# Patient Record
Sex: Female | Born: 1945 | Race: White | Hispanic: No | Marital: Single | Smoking: Former smoker
Health system: Southern US, Community
[De-identification: ages and names within clinical notes are randomized; demographics above are authoritative.]

## PROBLEM LIST (undated history)

## (undated) DIAGNOSIS — F32A Depression, unspecified: Secondary | ICD-10-CM

## (undated) DIAGNOSIS — F329 Major depressive disorder, single episode, unspecified: Secondary | ICD-10-CM

## (undated) DIAGNOSIS — E785 Hyperlipidemia, unspecified: Secondary | ICD-10-CM

## (undated) DIAGNOSIS — E119 Type 2 diabetes mellitus without complications: Secondary | ICD-10-CM

## (undated) DIAGNOSIS — I10 Essential (primary) hypertension: Secondary | ICD-10-CM

## (undated) DIAGNOSIS — K5790 Diverticulosis of intestine, part unspecified, without perforation or abscess without bleeding: Secondary | ICD-10-CM

## (undated) DIAGNOSIS — G4730 Sleep apnea, unspecified: Secondary | ICD-10-CM

## (undated) DIAGNOSIS — C801 Malignant (primary) neoplasm, unspecified: Secondary | ICD-10-CM

## (undated) HISTORY — DX: Hyperlipidemia, unspecified: E78.5

## (undated) HISTORY — DX: Diverticulosis of intestine, part unspecified, without perforation or abscess without bleeding: K57.90

## (undated) HISTORY — PX: GALLBLADDER SURGERY: SHX652

## (undated) HISTORY — DX: Major depressive disorder, single episode, unspecified: F32.9

## (undated) HISTORY — DX: Depression, unspecified: F32.A

## (undated) HISTORY — DX: Malignant (primary) neoplasm, unspecified: C80.1

## (undated) HISTORY — DX: Sleep apnea, unspecified: G47.30

## (undated) HISTORY — DX: Essential (primary) hypertension: I10

## (undated) HISTORY — PX: BREAST SURGERY: SHX581

## (undated) HISTORY — DX: Type 2 diabetes mellitus without complications: E11.9

## (undated) HISTORY — PX: BREAST EXCISIONAL BIOPSY: SUR124

---
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
## 1974-12-29 DIAGNOSIS — C801 Malignant (primary) neoplasm, unspecified: Secondary | ICD-10-CM

## 1974-12-29 HISTORY — PX: ABDOMINAL HYSTERECTOMY: SHX81

## 1974-12-29 HISTORY — DX: Malignant (primary) neoplasm, unspecified: C80.1

## 1993-12-29 HISTORY — PX: BREAST SURGERY: SHX581

## 2002-12-29 HISTORY — PX: JOINT REPLACEMENT: SHX530

## 2014-11-28 DIAGNOSIS — K635 Polyp of colon: Secondary | ICD-10-CM

## 2014-11-28 HISTORY — DX: Polyp of colon: K63.5

## 2015-12-30 HISTORY — PX: EYE SURGERY: SHX253

## 2017-12-29 HISTORY — PX: EYE MUSCLE SURGERY: SHX370

## 2018-08-11 ENCOUNTER — Encounter: Payer: Self-pay | Admitting: Family Medicine

## 2018-08-11 ENCOUNTER — Ambulatory Visit: Payer: Medicare Other | Admitting: Family Medicine

## 2018-08-11 VITALS — BP 110/70 | HR 70 | Temp 98.3°F | Ht 65.0 in | Wt 178.0 lb

## 2018-08-11 DIAGNOSIS — I10 Essential (primary) hypertension: Secondary | ICD-10-CM

## 2018-08-11 DIAGNOSIS — E782 Mixed hyperlipidemia: Secondary | ICD-10-CM

## 2018-08-11 DIAGNOSIS — Z7689 Persons encountering health services in other specified circumstances: Secondary | ICD-10-CM | POA: Diagnosis not present

## 2018-08-11 DIAGNOSIS — E119 Type 2 diabetes mellitus without complications: Secondary | ICD-10-CM

## 2018-08-11 NOTE — Patient Instructions (Signed)
Diabetes Mellitus and Exercise Exercising regularly is important for your overall health, especially when you have diabetes (diabetes mellitus). Exercising is not only about losing weight. It has many health benefits, such as increasing muscle strength and bone density and reducing body fat and stress. This leads to improved fitness, flexibility, and endurance, all of which result in better overall health. Exercise has additional benefits for people with diabetes, including:  Reducing appetite.  Helping to lower and control blood glucose.  Lowering blood pressure.  Helping to control amounts of fatty substances (lipids) in the blood, such as cholesterol and triglycerides.  Helping the body to respond better to insulin (improving insulin sensitivity).  Reducing how much insulin the body needs.  Decreasing the risk for heart disease by: ? Lowering cholesterol and triglyceride levels. ? Increasing the levels of good cholesterol. ? Lowering blood glucose levels.  What is my activity plan? Your health care provider or certified diabetes educator can help you make a plan for the type and frequency of exercise (activity plan) that works for you. Make sure that you:  Do at least 150 minutes of moderate-intensity or vigorous-intensity exercise each week. This could be brisk walking, biking, or water aerobics. ? Do stretching and strength exercises, such as yoga or weightlifting, at least 2 times a week. ? Spread out your activity over at least 3 days of the week.  Get some form of physical activity every day. ? Do not go more than 2 days in a row without some kind of physical activity. ? Avoid being inactive for more than 90 minutes at a time. Take frequent breaks to walk or stretch.  Choose a type of exercise or activity that you enjoy, and set realistic goals.  Start slowly, and gradually increase the intensity of your exercise over time.  What do I need to know about managing my  diabetes?  Check your blood glucose before and after exercising. ? If your blood glucose is higher than 240 mg/dL (13.3 mmol/L) before you exercise, check your urine for ketones. If you have ketones in your urine, do not exercise until your blood glucose returns to normal.  Know the symptoms of low blood glucose (hypoglycemia) and how to treat it. Your risk for hypoglycemia increases during and after exercise. Common symptoms of hypoglycemia can include: ? Hunger. ? Anxiety. ? Sweating and feeling clammy. ? Confusion. ? Dizziness or feeling light-headed. ? Increased heart rate or palpitations. ? Blurry vision. ? Tingling or numbness around the mouth, lips, or tongue. ? Tremors or shakes. ? Irritability.  Keep a rapid-acting carbohydrate snack available before, during, and after exercise to help prevent or treat hypoglycemia.  Avoid injecting insulin into areas of the body that are going to be exercised. For example, avoid injecting insulin into: ? The arms, when playing tennis. ? The legs, when jogging.  Keep records of your exercise habits. Doing this can help you and your health care provider adjust your diabetes management plan as needed. Write down: ? Food that you eat before and after you exercise. ? Blood glucose levels before and after you exercise. ? The type and amount of exercise you have done. ? When your insulin is expected to peak, if you use insulin. Avoid exercising at times when your insulin is peaking.  When you start a new exercise or activity, work with your health care provider to make sure the activity is safe for you, and to adjust your insulin, medicines, or food intake as needed.    Drink plenty of water while you exercise to prevent dehydration or heat stroke. Drink enough fluid to keep your urine clear or pale yellow. This information is not intended to replace advice given to you by your health care provider. Make sure you discuss any questions you have with  your health care provider. Document Released: 03/06/2004 Document Revised: 07/04/2016 Document Reviewed: 05/26/2016 Elsevier Interactive Patient Education  2018 [place].  High Cholesterol High cholesterol is a condition in which the blood has high levels of a white, waxy, fat-like substance (cholesterol). The human body needs small amounts of cholesterol. The liver makes all the cholesterol that the body needs. Extra (excess) cholesterol comes from the food that we eat. Cholesterol is carried from the liver by the blood through the blood vessels. If you have high cholesterol, deposits (plaques) may build up on the walls of your blood vessels (arteries). Plaques make the arteries narrower and stiffer. Cholesterol plaques increase your risk for heart attack and stroke. Work with your health care provider to keep your cholesterol levels in a healthy range. What increases the risk? This condition is more likely to develop in people who:  Eat foods that are high in animal fat (saturated fat) or cholesterol.  Are overweight.  Are not getting enough exercise.  Have a family history of high cholesterol.  What are the signs or symptoms? There are no symptoms of this condition. How is this diagnosed? This condition may be diagnosed from the results of a blood test.  If you are older than age 35, your health care provider may check your cholesterol every 4-6 years.  You may be checked more often if you already have high cholesterol or other risk factors for heart disease.  The blood test for cholesterol measures:  "Bad" cholesterol (LDL cholesterol). This is the main type of cholesterol that causes heart disease. The desired level for LDL is less than 100.  "Good" cholesterol (HDL cholesterol). This type helps to protect against heart disease by cleaning the arteries and carrying the LDL away. The desired level for HDL is 60 or higher.  Triglycerides. These are fats that the body can  store or burn for energy. The desired number for triglycerides is lower than 150.  Total cholesterol. This is a measure of the total amount of cholesterol in your blood, including LDL cholesterol, HDL cholesterol, and triglycerides. A healthy number is less than 200.  How is this treated? This condition is treated with diet changes, lifestyle changes, and medicines. Diet changes  This may include eating more whole grains, fruits, vegetables, nuts, and fish.  This may also include cutting back on red meat and foods that have a lot of added sugar. Lifestyle changes  Changes may include getting at least 40 minutes of aerobic exercise 3 times a week. Aerobic exercises include walking, biking, and swimming. Aerobic exercise along with a healthy diet can help you maintain a healthy weight.  Changes may also include quitting smoking. Medicines  Medicines are usually given if diet and lifestyle changes have failed to reduce your cholesterol to healthy levels.  Your health care provider may prescribe a statin medicine. Statin medicines have been shown to reduce cholesterol, which can reduce the risk of heart disease. Follow these instructions at home: Eating and drinking  If told by your health care provider:  Eat chicken (without skin), fish, veal, shellfish, ground [place] breast, and round or loin cuts of red meat.  Do not eat fried foods or fatty meats,  such as hot dogs and salami.  Eat plenty of fruits, such as apples.  Eat plenty of vegetables, such as broccoli, potatoes, and carrots.  Eat beans, peas, and lentils.  Eat grains such as barley, rice, couscous, and bulgur wheat.  Eat pasta without cream sauces.  Use skim or nonfat milk, and eat low-fat or nonfat yogurt and cheeses.  Do not eat or drink whole milk, cream, ice cream, egg yolks, or hard cheeses.  Do not eat stick margarine or tub margarines that contain trans fats (also called partially hydrogenated oils).  Do not  eat saturated tropical oils, such as coconut oil and palm oil.  Do not eat cakes, cookies, crackers, or other baked goods that contain trans fats.  General instructions  Exercise as directed by your health care provider. Increase your activity level with activities such as gardening, walking, and taking the stairs.  Take over-the-counter and prescription medicines only as told by your health care provider.  Do not use any products that contain nicotine or tobacco, such as cigarettes and e-cigarettes. If you need help quitting, ask your health care provider.  Keep all follow-up visits as told by your health care provider. This is important. Contact a health care provider if:  You are struggling to maintain a healthy diet or weight.  You need help to start on an exercise program.  You need help to stop smoking. Get help right away if:  You have chest pain.  You have trouble breathing. This information is not intended to replace advice given to you by your health care provider. Make sure you discuss any questions you have with your health care provider. Document Released: 12/15/2005 Document Revised: 07/12/2016 Document Reviewed: 06/14/2016 Elsevier Interactive Patient Education  2018 [place].  High Cholesterol High cholesterol is a condition in which the blood has high levels of a white, waxy, fat-like substance (cholesterol). The human body needs small amounts of cholesterol. The liver makes all the cholesterol that the body needs. Extra (excess) cholesterol comes from the food that we eat. Cholesterol is carried from the liver by the blood through the blood vessels. If you have high cholesterol, deposits (plaques) may build up on the walls of your blood vessels (arteries). Plaques make the arteries narrower and stiffer. Cholesterol plaques increase your risk for heart attack and stroke. Work with your health care provider to keep your cholesterol levels in a healthy range. What  increases the risk? This condition is more likely to develop in people who:  Eat foods that are high in animal fat (saturated fat) or cholesterol.  Are overweight.  Are not getting enough exercise.  Have a family history of high cholesterol.  What are the signs or symptoms? There are no symptoms of this condition. How is this diagnosed? This condition may be diagnosed from the results of a blood test.  If you are older than age 42, your health care provider may check your cholesterol every 4-6 years.  You may be checked more often if you already have high cholesterol or other risk factors for heart disease.  The blood test for cholesterol measures:  "Bad" cholesterol (LDL cholesterol). This is the main type of cholesterol that causes heart disease. The desired level for LDL is less than 100.  "Good" cholesterol (HDL cholesterol). This type helps to protect against heart disease by cleaning the arteries and carrying the LDL away. The desired level for HDL is 60 or higher.  Triglycerides. These are fats that the body  can store or burn for energy. The desired number for triglycerides is lower than 150.  Total cholesterol. This is a measure of the total amount of cholesterol in your blood, including LDL cholesterol, HDL cholesterol, and triglycerides. A healthy number is less than 200.  How is this treated? This condition is treated with diet changes, lifestyle changes, and medicines. Diet changes  This may include eating more whole grains, fruits, vegetables, nuts, and fish.  This may also include cutting back on red meat and foods that have a lot of added sugar. Lifestyle changes  Changes may include getting at least 40 minutes of aerobic exercise 3 times a week. Aerobic exercises include walking, biking, and swimming. Aerobic exercise along with a healthy diet can help you maintain a healthy weight.  Changes may also include quitting smoking. Medicines  Medicines are  usually given if diet and lifestyle changes have failed to reduce your cholesterol to healthy levels.  Your health care provider may prescribe a statin medicine. Statin medicines have been shown to reduce cholesterol, which can reduce the risk of heart disease. Follow these instructions at home: Eating and drinking  If told by your health care provider:  Eat chicken (without skin), fish, veal, shellfish, ground [place] breast, and round or loin cuts of red meat.  Do not eat fried foods or fatty meats, such as hot dogs and salami.  Eat plenty of fruits, such as apples.  Eat plenty of vegetables, such as broccoli, potatoes, and carrots.  Eat beans, peas, and lentils.  Eat grains such as barley, rice, couscous, and bulgur wheat.  Eat pasta without cream sauces.  Use skim or nonfat milk, and eat low-fat or nonfat yogurt and cheeses.  Do not eat or drink whole milk, cream, ice cream, egg yolks, or hard cheeses.  Do not eat stick margarine or tub margarines that contain trans fats (also called partially hydrogenated oils).  Do not eat saturated tropical oils, such as coconut oil and palm oil.  Do not eat cakes, cookies, crackers, or other baked goods that contain trans fats.  General instructions  Exercise as directed by your health care provider. Increase your activity level with activities such as gardening, walking, and taking the stairs.  Take over-the-counter and prescription medicines only as told by your health care provider.  Do not use any products that contain nicotine or tobacco, such as cigarettes and e-cigarettes. If you need help quitting, ask your health care provider.  Keep all follow-up visits as told by your health care provider. This is important. Contact a health care provider if:  You are struggling to maintain a healthy diet or weight.  You need help to start on an exercise program.  You need help to stop smoking. Get help right away if:  You have  chest pain.  You have trouble breathing. This information is not intended to replace advice given to you by your health care provider. Make sure you discuss any questions you have with your health care provider. Document Released: 12/15/2005 Document Revised: 07/12/2016 Document Reviewed: 06/14/2016 Elsevier Interactive Patient Education  [employer].

## 2018-08-11 NOTE — Progress Notes (Signed)
Patient presents to clinic today to f/u on chronic issues and establish care.  SUBJECTIVE: PMH: Pt is a 72 yo female with pmh sig for DM II, HTN, HLD.  Pt was previously seen by Gilman Buttner at [facility].  DM II: -Taking Synjardy 12.04-999 mg daily -fsbs at home 175-180 -pt states she is a stress eater and has not been eating as well as she should. -last hgb a1c was April 2019.  HTN: -taking hctz 25 mg daily and verapamil 180 mg daily -since move wants to get back into regular exercise.  Walks her dog daily.  HLD: -taking lipitor 20 mg daily -trying to eat better -denies myalgias  H/o depression: -on Bupropion since 1995 -states is doing well.  Notes a difference when not on the med.  Past Surg Hx: Cataract removal Breast biopsies Hysterectomy 2/2 cervical cancer.  Ovaries in place. B/l TKR  Social hx: Pt is retired from [employer], [employer], worked as an Runner, broadcasting/film/video.  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  Pt denies EtOH, tobacco, and drug use.  Health Maintenance: Mammogram --2019  Current Outpatient Medications on File Prior to Visit  Medication Sig Dispense Refill  . atorvastatin (LIPITOR) 20 MG tablet Take 20 mg by mouth daily.    Marland Kitchen buPROPion (WELLBUTRIN) 100 MG tablet Take 100 mg by mouth daily.    . Empagliflozin-metFORMIN HCl (SYNJARDY) 12.04-999 MG TABS Take 1,000 mg by mouth daily.    . hydrochlorothiazide (HYDRODIURIL) 25 MG tablet Take 25 mg by mouth daily. Take 1/2 tablet by mouth daily    . verapamil (CALAN-SR) 180 MG CR tablet Take 180 mg by mouth at bedtime.     No current facility-administered medications on file prior to visit.     No Known Allergies  No family history on file.  Social History   Socioeconomic History  . Marital status: Unknown    Spouse name: Not on file  . Number of children: Not on file  . Years of education: Not on file  . Highest education level: Not on file  Occupational History  . Not on file    Social Needs  . Financial resource strain: Not on file  . Food insecurity:    Worry: Not on file    Inability: Not on file  . Transportation needs:    Medical: Not on file    Non-medical: Not on file  Tobacco Use  . Smoking status: Not on file  Substance and Sexual Activity  . Alcohol use: Not on file  . Drug use: Not on file  . Sexual activity: Not on file  Lifestyle  . Physical activity:    Days per week: Not on file    Minutes per session: Not on file  . Stress: Not on file  Relationships  . Social connections:    Talks on phone: Not on file    Gets together: Not on file    Attends religious service: Not on file    Active member of club or organization: Not on file    Attends meetings of clubs or organizations: Not on file    Relationship status: Not on file  . Intimate partner violence:    Fear of current or ex partner: Not on file    Emotionally abused: Not on file    Physically abused: Not on file    Forced sexual activity: Not on file  Other Topics Concern  . Not on file  Social History Narrative  .  Not on file    ROS General: Denies fever, chills, night sweats, changes in weight, changes in appetite HEENT: Denies headaches, ear pain, changes in vision, rhinorrhea, sore throat CV: Denies CP, palpitations, SOB, orthopnea Pulm: Denies SOB, cough, wheezing GI: Denies abdominal pain, nausea, vomiting, diarrhea, constipation GU: Denies dysuria, hematuria, frequency, vaginal discharge Msk: Denies muscle cramps, joint pains Neuro: Denies weakness, numbness, tingling Skin: Denies rashes, bruising Psych: Denies depression, anxiety, hallucinations  BP 110/70 (BP Location: Left Arm, Patient Position: Sitting, Cuff Size: Normal)   Pulse 70   Temp 98.3 F (36.8 C) (Oral)   Ht 5\' 5"  (1.651 m)   Wt 178 lb (80.7 kg)   SpO2 96%   BMI 29.62 kg/m   Physical Exam Gen. Pleasant, well developed, well-nourished, in NAD HEENT - [place]/AT, PERRL, no scleral icterus, no nasal  drainage, pharynx without erythema or exudate. Lungs: no use of accessory muscles, CTAB, no wheezes, rales or rhonchi Cardiovascular: RRR, No r/g/m, no peripheral edema Abdomen: BS present, soft, nontender, nondistended Neuro:  A&Ox3, CN II-XII intact, normal gait Skin:  Warm, dry, intact, no lesions  No results found for this or any previous visit (from the past 2160 hour(s)).  Assessment/Plan: Type 2 diabetes mellitus without complication, without long-term current use of insulin (HCC) -Empagliflozin-Metformin 12.04-999 mg daily -lifestyle modifications encouraged. -continue checking fsbs -will check feet at next OFV.  Essential hypertension -controlled -continue HCTZ 25 mg and verapamil 180 mg daily.  Mixed hyperlipidemia -continue lipitor 20 mg  Encounter to establish care -We reviewed the PMH, PSH, FH, SH, Meds and Allergies. -We provided refills for any medications we will prescribe as needed. -We addressed current concerns per orders and patient instructions. -We have asked for records for pertinent exams, studies, vaccines and notes from previous providers. -We have advised patient to follow up per instructions below.  F/u prn   Grier Mitts, MD

## 2018-08-26 ENCOUNTER — Other Ambulatory Visit: Payer: Self-pay | Admitting: Family Medicine

## 2018-08-26 ENCOUNTER — Ambulatory Visit: Payer: Medicare Other | Admitting: Family Medicine

## 2018-08-26 ENCOUNTER — Encounter: Payer: Self-pay | Admitting: Family Medicine

## 2018-08-26 VITALS — BP 110/70 | HR 68 | Temp 98.0°F | Wt 179.0 lb

## 2018-08-26 DIAGNOSIS — N644 Mastodynia: Secondary | ICD-10-CM

## 2018-08-26 NOTE — Progress Notes (Signed)
Subjective:    Patient ID: Tara Guerrero, female    DOB: 1946/12/27, 72 y.o.   MRN: [id number]  No chief complaint on file.   HPI Patient was seen today for acute concern.  Patient endorses right breast/nipple soreness times a few days.  Patient states the area was initially red.  Patient has a family history of breast cancer, dx'd from ages [age].  Patient's last mammogram was done in April or May 2019 in [place].  Past Medical History:  Diagnosis Date  . Cancer ([place])   . Depression   . Diabetes mellitus without complication ([place])   . Hyperlipidemia   . Hypertension     Allergies  Allergen Reactions  . Seasonal Ic [Cholestatin]     Seasonal allergies    ROS General: Denies fever, chills, night sweats, changes in weight, changes in appetite HEENT: Denies headaches, ear pain, changes in vision, rhinorrhea, sore throat CV: Denies CP, palpitations, SOB, orthopnea Pulm: Denies SOB, cough, wheezing GI: Denies abdominal pain, nausea, vomiting, diarrhea, constipation GU: Denies dysuria, hematuria, frequency, vaginal discharge  +R breast soreness. Msk: Denies muscle cramps, joint pains Neuro: Denies weakness, numbness, tingling Skin: Denies rashes, bruising Psych: Denies depression, anxiety, hallucinations     Objective:    Blood pressure 110/70, pulse 68, temperature 98 F (36.7 C), temperature source Oral, weight 179 lb (81.2 kg), SpO2 97 %.   Gen. Pleasant, well-nourished, in no distress, normal affect   HEENT: [place]/AT, face symmetric, no scleral icterus, PERRLA, nares patent without drainage Cardiovascular: RRR, no peripheral edema Breast: No obvious deformities.  TTP of the right breast at 3 o'clock position and right areole and at 3:00 and 6:00 positions.  No obvious masses appreciated.  No nipple discharge or inversion.  No peau d'orange.  No cervical, supraclavicular, or axillary lymphadenopathy. Neuro:  A&Ox3, CN II-XII intact, normal gait Skin:  Warm, no lesions/  rash   Wt Readings from Last 3 Encounters:  08/26/18 179 lb (81.2 kg)  08/11/18 178 lb (80.7 kg)    No results found for: WBC, HGB, HCT, PLT, GLUCOSE, CHOL, TRIG, HDL, LDLDIRECT, LDLCALC, ALT, AST, NA, K, CL, CREATININE, BUN, CO2, TSH, PSA, INR, GLUF, HGBA1C, MICROALBUR  Assessment/Plan:  Soreness breast -Given soreness and family history we will proceed with diagnostic mammogram. -Screening mammogram done May 2019 in [place]. - Plan: MM DIAG BREAST TOMO UNI RIGHT  Follow-up PRN  Grier Mitts, MD

## 2018-09-03 ENCOUNTER — Ambulatory Visit
Admission: RE | Admit: 2018-09-03 | Discharge: 2018-09-03 | Disposition: A | Discharge status: Home / Self Care | Payer: Medicare Other | Source: Ambulatory Visit | Attending: Family Medicine | Admitting: Family Medicine

## 2018-09-03 DIAGNOSIS — N644 Mastodynia: Secondary | ICD-10-CM

## 2018-10-08 ENCOUNTER — Telehealth: Payer: Self-pay | Admitting: Family Medicine

## 2018-10-08 NOTE — Telephone Encounter (Signed)
Copied from [place] [id number]. Topic: General - Other >> Oct 08, 2018  1:21 PM Keene Breath wrote: Reason for CRM: Patient called regarding a refill for her medication, Ramipril.  Patient stated that the pharmacy has not record of it and she does not understand why.  Please advise.  CB# [phone]

## 2018-10-08 NOTE — Telephone Encounter (Signed)
Not our patient

## 2018-10-19 ENCOUNTER — Other Ambulatory Visit: Payer: Self-pay

## 2018-10-19 MED ORDER — VERAPAMIL HCL ER 180 MG PO TBCR: 180.0000 mg | EXTENDED_RELEASE_TABLET | Freq: Every day | ORAL | 0 refills | Status: DC

## 2018-10-19 NOTE — Telephone Encounter (Signed)
Patient requested this on 10/11 and was sent to wrong office. Patient is now calling back stating she is now out of the medication. She said she uses [facility]. She would like to get a weeks worth script for now at [facility] on [address] and the rest of the script sent to [facility]. Thank you. Patient would like a call back when this done.

## 2018-10-20 ENCOUNTER — Other Ambulatory Visit: Payer: Self-pay

## 2018-10-20 NOTE — Telephone Encounter (Signed)
Spoke with pt voiced understanding that Rx for Ramipril was sent to the pharmacy in pt chart, pt requested to add [facility] for future refills of her Rx

## 2018-10-21 NOTE — Telephone Encounter (Signed)
Pt called back stating it was Ramipril that was supposed to be sent to pharmacy. Pt is completely out of this medication. Pt is requesting 1 weeks worth [facility] to [facility] on [address] and the rest sent to [facility] Please follow up with pt.

## 2018-10-22 ENCOUNTER — Other Ambulatory Visit: Payer: Self-pay

## 2018-10-22 MED ORDER — RAMIPRIL 10 MG PO CAPS: 10.0000 mg | ORAL_CAPSULE | Freq: Every day | ORAL | 0 refills | Status: DC

## 2018-10-22 NOTE — Telephone Encounter (Signed)
Spoke with [facility] and because Rx was sent to [facility] then the refill can't be picked up until 11/2018.  Pharmacist will try and get an override from her insurance. Spoke with patient and she states she "will pay cash for refill".  Advised patient to speak with the pharmacist.

## 2018-12-13 ENCOUNTER — Ambulatory Visit: Payer: Self-pay

## 2018-12-13 NOTE — Telephone Encounter (Signed)
Returned call to patient who states that she had heartburn Friday night and got up to use the bathroom and returning to bed she became very dizzy and her vision went black.  She denies passing out.  She states she just sat down on a stool. Her symptoms ar resolved now  Reason for Disposition . [1] Chest pain lasts > 5 minutes AND [2] occurred > 3 days ago (72 hours) AND [3] NO chest pain or cardiac symptoms now  Answer Assessment - Initial Assessment Questions 1. DESCRIPTION: "Describe your dizziness."     spinning 2. VERTIGO: "Do you feel like either you or the room is spinning or tilting?"      spinning 3. LIGHTHEADED: "Do you feel lightheaded?" (e.g., somewhat faint, woozy, weak upon standing)     Fainted black vision but didn't fall 4. SEVERITY: "How bad is it?"  "Can you walk?"   - MILD - Feels unsteady but walking normally.   - MODERATE - Feels very unsteady when walking, but not falling; interferes with normal activities (e.g., school, work) .   - SEVERE - Unable to walk without falling (requires assistance).     No symptoms now 5. ONSET:  "When did the dizziness begin?"     Friday night 6. AGGRAVATING FACTORS: "Does anything make it worse?" (e.g., standing, change in head position)     no 7. CAUSE: "What do you think is causing the dizziness?"     Had heartburn then dizziness 8. RECURRENT SYMPTOM: "Have you had dizziness before?" If so, ask: "When was the last time?" "What happened that time?"     no 9. OTHER SYMPTOMS: "Do you have any other symptoms?" (e.g., headache, weakness, numbness, vomiting, earache)     Chest pain/heartburn down rt arm 10. PREGNANCY: "Is there any chance you are pregnant?" "When was your last menstrual period?"       N/A  Answer Assessment - Initial Assessment Questions 1. LOCATION: "Where does it hurt?"       Center of chest and down rt arm 2. RADIATION: "Does the pain go anywhere else?" (e.g., into neck, jaw, arms, back)     Rt arm 3. ONSET: "When  did the chest pain begin?" (Minutes, hours or days)      Friday night woke from sleep  4. PATTERN "Does the pain come and go, or has it been constant since it started?"  "Does it get worse with exertion?"     constant 5. DURATION: "How long does it last" (e.g., seconds, minutes, hours)     45 minutes 6. SEVERITY: "How bad is the pain?"  (e.g., Scale 1-10; mild, moderate, or severe)    - MILD (1-3): doesn't interfere with normal activities     - MODERATE (4-7): interferes with normal activities or awakens from sleep    - SEVERE (8-10): excruciating pain, unable to do any normal activities       6 7. CARDIAC RISK FACTORS: "Do you have any history of heart problems or risk factors for heart disease?" (e.g., prior heart attack, angina; high blood pressure, diabetes, being overweight, high cholesterol, smoking, or strong family history of heart disease)     Type 2 DM cholesterol meds over weight 8. PULMONARY RISK FACTORS: "Do you have any history of lung disease?"  (e.g., blood clots in lung, asthma, emphysema, birth control pills)     no 9. CAUSE: "What do you think is causing the chest pain?"     unsure 10. OTHER SYMPTOMS: "Do  you have any other symptoms?" (e.g., dizziness, nausea, vomiting, sweating, fever, difficulty breathing, cough)       Sweaty prior dizziness 11. PREGNANCY: "Is there any chance you are pregnant?" "When was your last menstrual period?"       N/A  Protocols used: CHEST PAIN-A-AH, DIZZINESS - VERTIGO-A-AH

## 2018-12-14 ENCOUNTER — Encounter: Payer: Self-pay | Admitting: Family Medicine

## 2018-12-14 ENCOUNTER — Ambulatory Visit (INDEPENDENT_AMBULATORY_CARE_PROVIDER_SITE_OTHER): Payer: Medicare Other | Admitting: Family Medicine

## 2018-12-14 VITALS — BP 112/60 | HR 56 | Temp 98.1°F | Wt 180.0 lb

## 2018-12-14 DIAGNOSIS — R55 Syncope and collapse: Secondary | ICD-10-CM

## 2018-12-14 DIAGNOSIS — R42 Dizziness and giddiness: Secondary | ICD-10-CM

## 2018-12-14 LAB — BASIC METABOLIC PANEL
BUN: 19 mg/dL (ref 6–23)
CO2: 28 meq/L (ref 19–32)
Calcium: 10.2 mg/dL (ref 8.4–10.5)
Chloride: 98 mEq/L (ref 96–112)
Creatinine, Ser: 0.9 mg/dL (ref 0.40–1.20)
GFR: 65.03 mL/min (ref >60.00)
GLUCOSE: 154 mg/dL — AB (ref 70–99)
POTASSIUM: 4.9 meq/L (ref 3.5–5.1)
SODIUM: 138 meq/L (ref 135–145)

## 2018-12-14 LAB — T4, FREE: FREE T4: 0.81 ng/dL (ref 0.60–1.60)

## 2018-12-14 LAB — CBC
HEMATOCRIT: 43.5 % (ref 36.0–46.0)
Hemoglobin: 14.4 g/dL (ref 12.0–15.0)
MCHC: 33.2 g/dL (ref 30.0–36.0)
MCV: 85.5 fl (ref 78.0–100.0)
Platelets: 312 10*3/uL (ref 150.0–400.0)
RBC: 5.08 Mil/uL (ref 3.87–5.11)
RDW: 13.9 % (ref 11.5–15.5)
WBC: 8.8 10*3/uL (ref 4.0–10.5)

## 2018-12-14 LAB — TSH: TSH: 2.52 u[IU]/mL (ref 0.35–4.50)

## 2018-12-14 NOTE — Patient Instructions (Addendum)
We will increase your hydrochlorothiazide to 12.5 mg daily.  You can still continue your other blood pressure medicines.  He should check your blood pressure at home and keep a log of the readings to bring with you to clinic.  Continue drinking plenty of water and staying hydrated daily.  Dizziness Dizziness is a common problem. It makes you feel unsteady or light-headed. You may feel like you are about to pass out (faint). Dizziness can lead to getting hurt if you stumble or fall. Dizziness can be caused by many things, including:  Medicines.  Not having enough water in your body (dehydration).  Illness.  Follow these instructions at home: Eating and drinking  Drink enough fluid to keep your pee (urine) clear or pale yellow. This helps to keep you from getting dehydrated. Try to drink more clear fluids, such as water.  Do not drink alcohol.  Limit how much caffeine you drink or eat, if your doctor tells you to do that.  Limit how much salt (sodium) you drink or eat, if your doctor tells you to do that. Activity  Avoid making quick movements. ? When you stand up from sitting in a chair, steady yourself until you feel okay. ? In the morning, first sit up on the side of the bed. When you feel okay, stand slowly while you hold onto something. Do this until you know that your balance is fine.  If you need to stand in one place for a long time, move your legs often. Tighten and relax the muscles in your legs while you are standing.  Do not drive or use heavy machinery if you feel dizzy.  Avoid bending down if you feel dizzy. Place items in your home so you can reach them easily without leaning over. Lifestyle  Do not use any products that contain nicotine or tobacco, such as cigarettes and e-cigarettes. If you need help quitting, ask your doctor.  Try to lower your stress level. You can do this by using methods such as yoga or meditation. Talk with your doctor if you need  help. General instructions  Watch your dizziness for any changes.  Take over-the-counter and prescription medicines only as told by your doctor. Talk with your doctor if you think that you are dizzy because of a medicine that you are taking.  Tell a friend or a family member that you are feeling dizzy. If he or she notices any changes in your behavior, have this person call your doctor.  Keep all follow-up visits as told by your doctor. This is important. Contact a doctor if:  Your dizziness does not go away.  Your dizziness or light-headedness gets worse.  You feel sick to your stomach (nauseous).  You have trouble hearing.  You have new symptoms.  You are unsteady on your feet.  You feel like the room is spinning. Get help right away if:  You throw up (vomit) or have watery poop (diarrhea), and you cannot eat or drink anything.  You have trouble: ? Talking. ? Walking. ? Swallowing. ? Using your arms, hands, or legs.  You feel generally weak.  You are not thinking clearly, or you have trouble forming sentences. A friend or family member may notice this.  You have: ? Chest pain. ? Pain in your belly (abdomen). ? Shortness of breath. ? Sweating.  Your vision changes.  You are bleeding.  You have a very bad headache.  You have neck pain or a stiff neck.  You  have a fever. These symptoms may be an emergency. Do not wait to see if the symptoms will go away. Get medical help right away. Call your local emergency services (911 in the [place] Do not drive yourself to the hospital. Summary  Dizziness makes you feel unsteady or light-headed. You may feel like you are about to pass out (faint).  Drink enough fluid to keep your pee (urine) clear or pale yellow. Do not drink alcohol.  Avoid making quick movements if you feel dizzy.  Watch your dizziness for any changes. This information is not intended to replace advice given to you by your health care provider.  Make sure you discuss any questions you have with your health care provider. Document Released: 12/04/2011 Document Revised: 01/01/2017 Document Reviewed: 01/01/2017 Elsevier Interactive Patient Education  2017 [employer].  Near-Syncope Near-syncope is when you suddenly become weak or dizzy, or you feel like you might pass out (faint). During an episode of near-syncope, you may:  Feel dizzy or light-headed.  Feel nauseous.  See all white or all black in your field of vision.  Have cold, clammy skin.  This condition is caused by a sudden decrease in blood flow to the brain. This decrease can result from various causes, but most of those causes are not dangerous. However, near-syncope can be a sign of a serious medical problem, so it is important to seek medical care. If you fainted, get medical help right away.Call your local emergency services (911 in the [place] Do not drive yourself to the hospital. Follow these instructions at home: Pay attention to any changes in your symptoms. Take these actions to help with your condition:  Have someone stay with you until you feel stable.  Do not drive, use machinery, or play sports until your health care provider says it is okay.  Keep all follow-up visits as told by your health care provider. This is important.  If you start to feel like you might faint, lie down right away and raise (elevate) your feet above the level of your heart. Breathe deeply and steadily. Wait until all of the symptoms have passed.  Drink enough fluid to keep your urine clear or pale yellow.  If you are taking blood pressure or heart medicine, get up slowly and take several minutes to sit and then stand. This can reduce dizziness.  Take over-the-counter and prescription medicines only as told by your health care provider.  Get help right away if:  You have a severe headache.  You have unusual pain in your chest, abdomen, or back.  You are bleeding from your  mouth or rectum, or you have black or tarry stool.  You have a very fast or irregular heartbeat (palpitations).  You faint once or repeatedly.  You have a seizure.  You are confused.  You have trouble walking.  You have severe weakness.  You have vision problems. These symptoms may represent a serious problem that is an emergency. Do not wait to see if your symptoms will go away. Get medical help right away. Call your local emergency services (911 in the [place] Do not drive yourself to the hospital. This information is not intended to replace advice given to you by your health care provider. Make sure you discuss any questions you have with your health care provider. Document Released: 12/15/2005 Document Revised: 05/22/2016 Document Reviewed: 08/29/2015 Elsevier Interactive Patient Education  2017 [employer].

## 2018-12-14 NOTE — Progress Notes (Signed)
Subjective:    Patient ID: Tara Guerrero, female    DOB: Nov 02, 1946, 72 y.o.   MRN: [id number]  No chief complaint on file.   HPI Patient was seen today for acute concern.  Pt notes near [place]opal episode Friday [place]  Pt woke up in the middle the night to urinate.  While going back to bed pt felt dizzy, had a burning sensation in her chest, and notes her vision became dark.  Pt states she laid on the floor for about 20 minutes before going back to bed.  Pt denies history of heartburn, vertigo.  Pt states she has been drinking plenty of fluids and had dinner that night.  Pt is never had this happen to her before.  Since then pt has been feeling ok.  Pt taking ramipril 10 mg, HCTZ 12.5 mg, verapamil 180 mg for HTN.  Pt has not checked her bp, but states she can.  Past Medical History:  Diagnosis Date  . Cancer ([place])   . Depression   . Diabetes mellitus without complication ([place])   . Hyperlipidemia   . Hypertension     Allergies  Allergen Reactions  . Seasonal Ic [Cholestatin]     Seasonal allergies     ROS General: Denies fever, chills, night sweats, changes in weight, changes in appetite  +dizziness HEENT: Denies headaches, ear pain, rhinorrhea, sore throat  +changes in vision CV: Denies CP, palpitations, SOB, orthopnea Pulm: Denies SOB, cough, wheezing GI: Denies abdominal pain, nausea, vomiting, diarrhea, constipation  +burning in chest GU: Denies dysuria, hematuria, frequency, vaginal discharge Msk: Denies muscle cramps, joint pains Neuro: Denies weakness, numbness, tingling Skin: Denies rashes, bruising Psych: Denies depression, anxiety, hallucinations    Objective:    Blood pressure 112/60, pulse (!) 56, temperature 98.1 F (36.7 C), temperature source Oral, weight 180 lb (81.6 kg), SpO2 96 %.   Gen. Pleasant, well-nourished, in no distress, normal affect   HEENT: [place]/AT, face symmetric, no scleral icterus, PERRLA, no nystagmus, nares  patent without drainage, pharynx without erythema or exudate.  No carotid bruits.  TMs normal bilaterally. Lungs: no accessory muscle use, CTAB, no wheezes or rales Cardiovascular: RRR, no m/r/g, no peripheral edema Musculoskeletal: No deformities, no cyanosis or clubbing, normal tone and strength Neuro:  A&Ox3, CN II-XII intact, normal gait  Wt Readings from Last 3 Encounters:  12/14/18 180 lb (81.6 kg)  08/26/18 179 lb (81.2 kg)  08/11/18 178 lb (80.7 kg)    No results found for: WBC, HGB, HCT, PLT, GLUCOSE, CHOL, TRIG, HDL, LDLDIRECT, LDLCALC, ALT, AST, NA, K, CL, CREATININE, BUN, CO2, TSH, PSA, INR, GLUF, HGBA1C, MICROALBUR  Assessment/Plan:  Postural dizziness with near syncope -Orthostatics obtained and normal -Given patient's low BP at baseline discussed holding hydrochlorothiazide 12.5 mg. -Pt to continue verapamil 180 mg and ramipril 10 mg daily -Encouraged to check BP daily and keep a record to bring with her to clinic -Continue p.o. hydration - Plan: Basic metabolic panel, TSH, T4, free, CBC (no diff)  Follow-up PRN in the next month, sooner if needed  Grier Mitts, MD

## 2018-12-28 ENCOUNTER — Other Ambulatory Visit: Payer: Self-pay

## 2018-12-28 MED ORDER — RAMIPRIL 10 MG PO CAPS: 10.0000 mg | ORAL_CAPSULE | Freq: Every day | ORAL | 2 refills | Status: DC

## 2019-01-11 ENCOUNTER — Encounter: Payer: Self-pay | Admitting: Family Medicine

## 2019-01-11 NOTE — Telephone Encounter (Signed)
Copied from [facility] [id number]. Topic: Quick Communication - Rx Refill/Question >> Jan 11, 2019  2:11 PM Tara Guerrero E wrote: Medication: hydrochlorothiazide (HYDRODIURIL) 25 MG tablet  Pt needs clarification for this medication. Pt is taking half tablet of a 25mg  tablet - (12.5mg ) but stated she was advised by Dr. Volanda Napoleon to increse her dose to 12.5.  Pt wants to know if Dr. Volanda Napoleon wants her to increase to 1 tablet @ 25mg  per day?

## 2019-01-12 ENCOUNTER — Other Ambulatory Visit: Payer: Self-pay

## 2019-01-12 MED ORDER — RAMIPRIL 10 MG PO CAPS: 10.0000 mg | ORAL_CAPSULE | Freq: Every day | ORAL | 2 refills | Status: DC

## 2019-01-12 MED ORDER — BUPROPION HCL 100 MG PO TABS: 100.0000 mg | ORAL_TABLET | Freq: Every day | ORAL | 3 refills | Status: DC

## 2019-01-12 MED ORDER — EMPAGLIFLOZIN-METFORMIN HCL 12.5-1000 MG PO TABS: 1000.0000 mg | ORAL_TABLET | Freq: Two times a day (BID) | ORAL | 3 refills | Status: DC

## 2019-01-12 MED ORDER — ATORVASTATIN CALCIUM 20 MG PO TABS: 20.0000 mg | ORAL_TABLET | Freq: Every day | ORAL | 3 refills | Status: DC

## 2019-01-12 MED ORDER — VERAPAMIL HCL ER 180 MG PO TBCR: 180.0000 mg | EXTENDED_RELEASE_TABLET | Freq: Every day | ORAL | 0 refills | Status: DC

## 2019-01-19 ENCOUNTER — Telehealth: Payer: Self-pay

## 2019-01-19 NOTE — Telephone Encounter (Signed)
Author phoned pt. to offer initial AWV. Appointment made for 1/27 at [place]. Author advised pt. To bring along any advance directive she may have, and pt. Stated she will try to retrieve records from [place], where she used to live and seek medical care.

## 2019-01-21 NOTE — Progress Notes (Signed)
Subjective:   Tara Guerrero is a 73 y.o. female who presents for an Initial Medicare Annual Wellness Visit.  Review of Systems    No ROS.  Medicare Wellness Visit. Additional risk factors are reflected in the social history.   Cardiac Risk Factors include: hypertension;diabetes mellitus;dyslipidemia;advanced age (>36men, >67 women);sedentary lifestyle Sleep patterns: no sleep issues per pt.   Home Safety/Smoke Alarms: Feels safe in home. Smoke alarms in place.  Living environment; residence and Firearm Safety: 2-story house. No use or need for DME at this time.  Seat Belt Safety/Bike Helmet: Wears seat belt.   Female:   Pap- N/A as over 65yo      Mammo- 04/2018, due 04/2020       Dexa scan- had one in past, but unsure of last one. Order placed.       CCS-  06/2014; pt states "I'm due at the end of the year".       Objective:    Today's Vitals   01/24/19 0948  BP: 136/76  Pulse: 62  Resp: 16  SpO2: 95%  Weight: 180 lb (81.6 kg)  Height: 5' 4.5" (1.638 m)  PainSc: 0-No pain   Body mass index is 30.42 kg/m.  Advanced Directives 01/24/2019  Does Patient Have a Medical Advance Directive? Yes  Type of Paramedic of [place];Living will  Does patient want to make changes to medical advance directive? Yes (MAU/Ambulatory/Procedural Areas - Information given)  Copy of [place] in Chart? No - copy requested    Current Medications (verified) Outpatient Encounter Medications as of 01/24/2019  Medication Sig  . aspirin EC 81 MG tablet Take 81 mg by mouth daily.  Marland Kitchen atorvastatin (LIPITOR) 20 MG tablet Take 1 tablet (20 mg total) by mouth daily.  Marland Kitchen buPROPion (WELLBUTRIN) 100 MG tablet Take 1 tablet (100 mg total) by mouth daily.  . cetirizine (ZYRTEC) 10 MG chewable tablet Chew 10 mg by mouth daily.  . Empagliflozin-metFORMIN HCl (SYNJARDY) 12.04-999 MG TABS Take 1,000 mg by mouth 2 (two) times daily.  . hydrochlorothiazide  (HYDRODIURIL) 25 MG tablet Take 25 mg by mouth daily. Take 1/2 tablet by mouth daily  . ramipril (ALTACE) 10 MG capsule Take 1 capsule (10 mg total) by mouth daily.  . verapamil (CALAN-SR) 180 MG CR tablet Take 1 tablet (180 mg total) by mouth at bedtime.   No facility-administered encounter medications on file as of 01/24/2019.     Allergies (verified) Seasonal ic [cholestatin]   History: Past Medical History:  Diagnosis Date  . 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 Depression   . Diabetes mellitus without complication ([place])   . Hyperlipidemia   . Hypertension    Past Surgical History:  Procedure Laterality Date  . ABDOMINAL HYSTERECTOMY  1976   s/p uterine CA  . BREAST EXCISIONAL BIOPSY Bilateral   . BREAST SURGERY  1995   L breast node removal  . BREAST SURGERY     R breast biopsy  . EYE MUSCLE SURGERY  2019  . EYE SURGERY  2017   bilateral cataract surgery  . JOINT REPLACEMENT  2004   bilateral knee replacements  . TONSILLECTOMY  1950   Family History  Problem Relation Age of Onset  . Cancer Mother   . Hyperlipidemia Mother   . Hypertension Mother   . Stroke Mother   . Breast cancer Mother        diagnosed in her 7's  . Alcohol abuse  Father   . Cancer Sister   . Hyperlipidemia Sister   . Hypertension Sister   . Stroke Sister   . Breast cancer Sister        diagnosed in her 48's  . Mental illness Maternal Grandmother   . Hypertension Maternal Grandmother   . Hyperlipidemia Maternal Grandmother   . Mental retardation Maternal Grandmother   . Hearing loss Maternal Grandfather   . Stroke Maternal Grandfather   . Alcohol abuse Paternal Grandmother   . Alcohol abuse Paternal Grandfather   . Breast cancer Cousin        diagnosed in her 36's  . Breast cancer Sister 70  . Breast cancer Other        diagnosed late 20's   Social History   Socioeconomic History  . Marital status: Single    Spouse name: Not on file  . Number of children: 1  . Years of education:  Not on file  . Highest education level: Not on file  Occupational History    Comment: part-time daycare assistant  Social Needs  . Financial resource strain: Not hard at all  . Food insecurity:    Worry: Never true    Inability: Never true  . Transportation needs:    Medical: No    Non-medical: No  Tobacco Use  . Smoking status: Former Research scientist (life sciences)  . Smokeless tobacco: Never Used  Substance and Sexual Activity  . Alcohol use: Yes    Alcohol/week: 1.0 standard drinks    Types: 1 Glasses of wine per week  . Drug use: Never  . Sexual activity: Not Currently  Lifestyle  . Physical activity:    Days per week: 0 days    Minutes per session: 0 min  . Stress: Not at all  Relationships  . Social connections:    Talks on phone: Twice a week    Gets together: Once a week    Attends religious service: More than 4 times per year    Active member of club or organization: No    Attends meetings of clubs or organizations: Never    Relationship status: Not on file  Other Topics Concern  . Not on file  Social History Narrative   01/24/2019: Lives with granddaughter and dog.    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     Tobacco Counseling Counseling given: Not Answered    Activities of Daily Living In your present state of health, do you have any difficulty performing the following activities: 01/24/2019  Hearing? N  Vision? N  Difficulty concentrating or making decisions? N  Walking or climbing stairs? N  Dressing or bathing? N  Doing errands, shopping? N  Preparing Food and eating ? N  Using the Toilet? N  In the past six months, have you accidently leaked urine? N  Do you have problems with loss of bowel control? N  Managing your Medications? N  Managing your Finances? N  Housekeeping or managing your Housekeeping? N     Immunizations and Health Maintenance  There is no immunization history on file for this  patient. Health Maintenance Due  Topic Date Due  . Hepatitis C Screening  [birth year]  . TETANUS/TDAP  10/22/1963  . DEXA SCAN  10/21/2009    Patient Care Team: Billie Ruddy, MD as PCP - General (Family Medicine)  Indicate any recent Medical Services you may have received from other than [facility] providers  in the past year (date may be approximate).     Assessment:   This is a routine wellness examination for Tara Guerrero. Physical assessment deferred to PCP.   Hearing/Vision screen Hearing Screening Comments: Able to hear conversational tones w/o difficulty. No issues reported. Pt. Declined hearing screen.   Vision Screening Comments: States vision is fine; sees Dr. Manuella Ghazi yearly per pt. Report. Author requested pt. To bring records from [place] at next OV. Pt. Declined vision screen here.  Dietary issues and exercise activities discussed: Current Exercise Habits: The patient does not participate in regular exercise at present(walks dog), Exercise limited by: psychological condition(s)(when asked if she desires to do anything more physical, pt. stated "I'll do it if I want to". )  Diet (meal preparation, eat out, water intake, caffeinated beverages, dairy products, fruits and vegetables): in general, a "healthy" diet  per pt. Pt. Did not wish to elaborate.      Goals    . Patient Stated     Patient did not have any goals.      Depression Screen PHQ 2/9 Scores 01/24/2019  Exception Documentation Patient refusal    Fall Risk Fall Risk  01/24/2019  Falls in the past year? 0     Cognitive Function:       Ad8 score reviewed for issues:  Issues making decisions: no  Less interest in hobbies / activities: no  Repeats questions, stories (family complaining): no  Trouble using ordinary gadgets (microwave, computer, phone):no  Forgets the month or year: no  Mismanaging finances: no  Remembering appts: no  Daily problems with thinking and/or memory: no Ad8 score is=  0  When asked if pt. has noticed any changes in her memory, pt. Denied any issues. During medication review, pt. admitted to not always taking her medications routinely at the same time everyday, and when asked if it was a matter of remembering to take them, pt. Became annoyed that the question was asked, but did not say one way or another. Pt. Grew offended when asked if she uses a medication box. Nevertheless, she stated that she did, and when asked if her granddaughter could potentially assist in reminding her to take her medications, as well as offering other tips, pt. stated " I don't need that". Pt. had not taken her BP meds prior to appointment, BP slightly elevated during visit.     Screening Tests Health Maintenance  Topic Date Due  . Hepatitis C Screening  02/13/1946  . TETANUS/TDAP  10/22/1963  . DEXA SCAN  10/21/2009  . PNA vac Low Risk Adult (1 of 2 - PCV13) 02/26/2019 (Originally 10/21/2009)  . MAMMOGRAM  05/27/2020  . COLONOSCOPY  07/27/2024  . INFLUENZA VACCINE  Completed     Plan:    Please retrieve any medical records from your previous provider in [place], including exams, lab results, immunizations, advance directives, etc.  DEXA, or bone density scan ordered. [facility] should be following up with you to schedule.  Keep consistent schedule for taking your medications. Let us know if the dizziness returns.  Will follow up with Dr. Volanda Napoleon regarding need for replacement diabetic meter and supplies. I have personally reviewed and noted the following in the patient's chart:   . Medical and social history . Use of alcohol, tobacco or illicit drugs  . Current medications and supplements . Functional ability and status . Nutritional status . Physical activity . Advanced directives . List of other physicians . Hospitalizations, surgeries, and ER visits in previous  12 months . Vitals . Screenings to include cognitive, depression, and falls . Referrals and  appointments  In addition, I have reviewed and discussed with patient certain preventive protocols, quality metrics, and best practice recommendations. A written personalized care plan for preventive services as well as general preventive health recommendations were provided to patient.     Alphia Moh, RN   01/24/2019

## 2019-01-24 ENCOUNTER — Ambulatory Visit (INDEPENDENT_AMBULATORY_CARE_PROVIDER_SITE_OTHER): Payer: Medicare Other

## 2019-01-24 ENCOUNTER — Telehealth: Payer: Self-pay

## 2019-01-24 VITALS — BP 136/76 | HR 62 | Resp 16 | Ht 64.5 in | Wt 180.0 lb

## 2019-01-24 DIAGNOSIS — Z0000 Encounter for general adult medical examination without abnormal findings: Secondary | ICD-10-CM | POA: Diagnosis not present

## 2019-01-24 DIAGNOSIS — Z13820 Encounter for screening for osteoporosis: Secondary | ICD-10-CM

## 2019-01-24 NOTE — Patient Instructions (Addendum)
Please retrieve any medical records from your previous provider in [place], including exams, lab results, immunizations, advance directives, etc.  DEXA, or bone density scan ordered. [facility] should be following up with you to schedule.  Keep consistent schedule for taking your medications. Let us know if the dizziness returns.  Will follow up with Dr. Volanda Napoleon regarding need for replacement diabetic meter and supplies.   Health Maintenance, Female Adopting a healthy lifestyle and getting preventive care can go a long way to promote health and wellness. Talk with your health care provider about what schedule of regular examinations is right for you. This is a good chance for you to check in with your provider about disease prevention and staying healthy. In between checkups, there are plenty of things you can do on your own. Experts have done a lot of research about which lifestyle changes and preventive measures are most likely to keep you healthy. Ask your health care provider for more information. Weight and diet Eat a healthy diet  Be sure to include plenty of vegetables, fruits, low-fat dairy products, and lean protein.  Do not eat a lot of foods high in solid fats, added sugars, or salt.  Get regular exercise. This is one of the most important things you can do for your health. ? Most adults should exercise for at least 150 minutes each week. The exercise should increase your heart rate and make you sweat (moderate-intensity exercise). ? Most adults should also do strengthening exercises at least twice a week. This is in addition to the moderate-intensity exercise. Maintain a healthy weight  Body mass index (BMI) is a measurement that can be used to identify possible weight problems. It estimates body fat based on height and weight. Your health care provider can help determine your BMI and help you achieve or maintain a healthy weight.  For females 62 years of age and older: ? A BMI  below 18.5 is considered underweight. ? A BMI of 18.5 to 24.9 is normal. ? A BMI of 25 to 29.9 is considered overweight. ? A BMI of 30 and above is considered obese. Watch levels of cholesterol and blood lipids  You should start having your blood tested for lipids and cholesterol at 75 years of age, then have this test every 5 years.  You may need to have your cholesterol levels checked more often if: ? Your lipid or cholesterol levels are high. ? You are older than 75 years of age. ? You are at high risk for heart disease. Cancer screening Lung Cancer  Lung cancer screening is recommended for adults 10-52 years old who are at high risk for lung cancer because of a history of smoking.  A yearly low-dose CT scan of the lungs is recommended for people who: ? Currently smoke. ? Have quit within the past 15 years. ? Have at least a 30-pack-year history of smoking. A pack year is smoking an average of one pack of cigarettes a day for 1 year.  Yearly screening should continue until it has been 15 years since you quit.  Yearly screening should stop if you develop a health problem that would prevent you from having lung cancer treatment. Breast Cancer  Practice breast self-awareness. This means understanding how your breasts normally appear and feel.  It also means doing regular breast self-exams. Let your health care provider know about any changes, no matter how small.  If you are in your 20s or 30s, you should have a clinical breast  exam (CBE) by a health care provider every 1-3 years as part of a regular health exam.  If you are 53 or older, have a CBE every year. Also consider having a breast X-ray (mammogram) every year.  If you have a family history of breast cancer, talk to your health care provider about genetic screening.  If you are at high risk for breast cancer, talk to your health care provider about having an MRI and a mammogram every year.  Breast cancer gene (BRCA)  assessment is recommended for women who have family members with BRCA-related cancers. BRCA-related cancers include: ? Breast. ? Ovarian. ? Tubal. ? Peritoneal cancers.  Results of the assessment will determine the need for genetic counseling and BRCA1 and BRCA2 testing. Cervical Cancer Your health care provider may recommend that you be screened regularly for cancer of the pelvic organs (ovaries, uterus, and vagina). This screening involves a pelvic examination, including checking for microscopic changes to the surface of your cervix (Pap test). You may be encouraged to have this screening done every 3 years, beginning at age 58.  For women ages 62-65, health care providers may recommend pelvic exams and Pap testing every 3 years, or they may recommend the Pap and pelvic exam, combined with testing for human papilloma virus (HPV), every 5 years. Some types of HPV increase your risk of cervical cancer. Testing for HPV may also be done on women of any age with unclear Pap test results.  Other health care providers may not recommend any screening for nonpregnant women who are considered low risk for pelvic cancer and who do not have symptoms. Ask your health care provider if a screening pelvic exam is right for you.  If you have had past treatment for cervical cancer or a condition that could lead to cancer, you need Pap tests and screening for cancer for at least 20 years after your treatment. If Pap tests have been discontinued, your risk factors (such as having a new sexual partner) need to be reassessed to determine if screening should resume. Some women have medical problems that increase the chance of getting cervical cancer. In these cases, your health care provider may recommend more frequent screening and Pap tests. Colorectal Cancer  This type of cancer can be detected and often prevented.  Routine colorectal cancer screening usually begins at 75 years of age and continues through 75 years  of age.  Your health care provider may recommend screening at an earlier age if you have risk factors for colon cancer.  Your health care provider may also recommend using home test kits to check for hidden blood in the stool.  A small camera at the end of a tube can be used to examine your colon directly (sigmoidoscopy or colonoscopy). This is done to check for the earliest forms of colorectal cancer.  Routine screening usually begins at age 91.  Direct examination of the colon should be repeated every 5-10 years through 75 years of age. However, you may need to be screened more often if early forms of precancerous polyps or small growths are found. Skin Cancer  Check your skin from head to toe regularly.  Tell your health care provider about any new moles or changes in moles, especially if there is a change in a mole's shape or color.  Also tell your health care provider if you have a mole that is larger than the size of a pencil eraser.  Always use sunscreen. Apply sunscreen liberally and repeatedly  throughout the day.  Protect yourself by wearing long sleeves, pants, a wide-brimmed hat, and sunglasses whenever you are outside. Heart disease, diabetes, and high blood pressure  High blood pressure causes heart disease and increases the risk of stroke. High blood pressure is more likely to develop in: ? People who have blood pressure in the high end of the normal range (130-139/85-89 mm Hg). ? People who are overweight or obese. ? People who are African American.  If you are 65-59 years of age, have your blood pressure checked every 3-5 years. If you are 35 years of age or older, have your blood pressure checked every year. You should have your blood pressure measured twice-once when you are at a hospital or clinic, and once when you are not at a hospital or clinic. Record the average of the two measurements. To check your blood pressure when you are not at a hospital or clinic, you can  use: ? An automated blood pressure machine at a pharmacy. ? A home blood pressure monitor.  If you are between 58 years and 1 years old, ask your health care provider if you should take aspirin to prevent strokes.  Have regular diabetes screenings. This involves taking a blood sample to check your fasting blood sugar level. ? If you are at a normal weight and have a low risk for diabetes, have this test once every three years after 75 years of age. ? If you are overweight and have a high risk for diabetes, consider being tested at a younger age or more often. Preventing infection Hepatitis B  If you have a higher risk for hepatitis B, you should be screened for this virus. You are considered at high risk for hepatitis B if: ? You were born in a country where hepatitis B is common. Ask your health care provider which countries are considered high risk. ? Your parents were born in a high-risk country, and you have not been immunized against hepatitis B (hepatitis B vaccine). ? You have HIV or AIDS. ? You use needles to inject street drugs. ? You live with someone who has hepatitis B. ? You have had sex with someone who has hepatitis B. ? You get hemodialysis treatment. ? You take certain medicines for conditions, including cancer, organ transplantation, and autoimmune conditions. Hepatitis C  Blood testing is recommended for: ? Everyone born from [birth year] through 1967. ? Anyone with known risk factors for hepatitis C. Sexually transmitted infections (STIs)  You should be screened for sexually transmitted infections (STIs) including gonorrhea and chlamydia if: ? You are sexually active and are younger than 75 years of age. ? You are older than 75 years of age and your health care provider tells you that you are at risk for this type of infection. ? Your sexual activity has changed since you were last screened and you are at an increased risk for chlamydia or gonorrhea. Ask your health care  provider if you are at risk.  If you do not have HIV, but are at risk, it may be recommended that you take a prescription medicine daily to prevent HIV infection. This is called pre-exposure prophylaxis (PrEP). You are considered at risk if: ? You are sexually active and do not regularly use condoms or know the HIV status of your partner(s). ? You take drugs by injection. ? You are sexually active with a partner who has HIV. Talk with your health care provider about whether you are at high risk of  being infected with HIV. If you choose to begin PrEP, you should first be tested for HIV. You should then be tested every 3 months for as long as you are taking PrEP. Pregnancy  If you are premenopausal and you may become pregnant, ask your health care provider about preconception counseling.  If you may become pregnant, take 400 to 800 micrograms (mcg) of folic acid every day.  If you want to prevent pregnancy, talk to your health care provider about birth control (contraception). Osteoporosis and menopause  Osteoporosis is a disease in which the bones lose minerals and strength with aging. This can result in serious bone fractures. Your risk for osteoporosis can be identified using a bone density scan.  If you are 87 years of age or older, or if you are at risk for osteoporosis and fractures, ask your health care provider if you should be screened.  Ask your health care provider whether you should take a calcium or vitamin D supplement to lower your risk for osteoporosis.  Menopause may have certain physical symptoms and risks.  Hormone replacement therapy may reduce some of these symptoms and risks. Talk to your health care provider about whether hormone replacement therapy is right for you. Follow these instructions at home:  Schedule regular health, dental, and eye exams.  Stay current with your immunizations.  Do not use any tobacco products including cigarettes, chewing tobacco, or  electronic cigarettes.  If you are pregnant, do not drink alcohol.  If you are breastfeeding, limit how much and how often you drink alcohol.  Limit alcohol intake to no more than 1 drink per day for nonpregnant women. One drink equals 12 ounces of beer, 5 ounces of wine, or 1 ounces of hard liquor.  Do not use street drugs.  Do not share needles.  Ask your health care provider for help if you need support or information about quitting drugs.  Tell your health care provider if you often feel depressed.  Tell your health care provider if you have ever been abused or do not feel safe at home. This information is not intended to replace advice given to you by your health care provider. Make sure you discuss any questions you have with your health care provider. Document Released: 06/30/2011 Document Revised: 05/22/2016 Document Reviewed: 09/18/2015 Elsevier Interactive Patient Education  2019 [employer].

## 2019-01-24 NOTE — Telephone Encounter (Signed)
During AWV, pt expressed need for replacement diabetic meter and supplies as her machine is broken. She currently uses a one-touch brand, not sure which kind. When last checked last week, pt. stated her BG was 150mg /dL after eating. Author offered to have pt. bring new meter, once received, into office if there were any questions on its use.  When asked, pt. stated she has not had dizzy spell since last discussed with PCP, and is still taking, at least sometimes, her HCTZ. When author asked if remembering to take her medications was an issue, pt. became offended, and was overall offended by author's questions during the visit. Author explained that questions about memory, depression, medication compliance, etc., were all a part of a wellness visit, to assess any needs, given that this was pt's initial wellness visit, and pt. verbalized understanding, but was still  resistant to offering up information or chatting in general. Nevertheless, author advised to keep to a schedule regarding taking meds, and let us know if dizziness returns. Author did not complete MMSE, vision, or hearing screen due to patient resistance for further questioning. "I'll tell you if there's something wrong. I'm fine.", pt stated.   Routed to PCP as FYI and for follow-up on requested diabetic meter and supplies.

## 2019-01-27 ENCOUNTER — Other Ambulatory Visit: Payer: Self-pay

## 2019-01-27 NOTE — Telephone Encounter (Signed)
Re-routed to PCP, as well as CMA, to address.

## 2019-01-31 ENCOUNTER — Other Ambulatory Visit: Payer: Self-pay | Admitting: Family Medicine

## 2019-01-31 ENCOUNTER — Telehealth: Payer: Self-pay | Admitting: Family Medicine

## 2019-01-31 ENCOUNTER — Other Ambulatory Visit: Payer: Self-pay

## 2019-01-31 MED ORDER — ONETOUCH ULTRA 2 W/DEVICE KIT: PACK | 0 refills | Status: AC

## 2019-01-31 NOTE — Telephone Encounter (Signed)
Copied from [place] [id number]. Topic: Quick Communication - See Telephone Encounter >> Jan 31, 2019  4:09 PM Ivar Drape wrote: CRM for notification. See Telephone encounter for: 01/31/19. Patient is in need of a new One Touch meter w/strips and have it sent to her preferred pharmacy [facility] on [address].

## 2019-01-31 NOTE — Telephone Encounter (Signed)
Copied from [place] [phone]. Topic: Quick Communication - Rx Refill/Question >> Jan 31, 2019  3:56 PM Windy Kalata wrote: Medication: buPROPion (WELLBUTRIN) 100 MG tablet     Has the patient contacted their pharmacy? Yes.   (Agent: If no, request that the patient contact the pharmacy for the refill.) (Agent: If yes, when and what did the pharmacy advise?)Call office  Preferred Pharmacy (with phone number or street name): [place], [place] [phone] (Phone) [phone] (Fax)    Agent: Please be advised that RX refills may take up to 3 business days. We ask that you follow-up with your pharmacy.

## 2019-01-31 NOTE — Telephone Encounter (Signed)
Rx was sent to pt pharmacy as requested °

## 2019-01-31 NOTE — Telephone Encounter (Signed)
Pt Diabetic meter was sent to pharmacy for refill

## 2019-02-01 NOTE — Telephone Encounter (Signed)
Spoke with pt pharmacy gave clerification on Wellbutrin Rx per dr Volanda Napoleon

## 2019-02-02 NOTE — Progress Notes (Signed)
Medical screening examination/treatment was performed by qualified clinical staff member and as supervising physician I was immediately available for consultation/collaboration. I have reviewed documentation and agree with assessment and plan.  Kaikoa Magro R Doug Bucklin, MD  

## 2019-03-11 ENCOUNTER — Ambulatory Visit: Payer: Self-pay

## 2019-03-11 NOTE — Telephone Encounter (Signed)
Please Advise

## 2019-03-11 NOTE — Telephone Encounter (Signed)
Patient called and says she was exposed to the flu from her granddaughter who lives with her. The granddaughter was diagnosed this morning at 0800. The patient says the granddaughter's doctor recommended her to contact her PCP and ask for Tamiflu to prevent the flu and for something for nausea to take with Tamiflu, since it can cause nausea. The patient says she is not having any symptoms, except a headache for the past couple of days, but says this is related to seasonal allergies. I advised continue taking allergy medications and try Tylenol for the headache. I advised I will send this request to Dr. Volanda Napoleon and someone will call back with her recommendation, care advice given, patient verbalized understanding.  Reason for Disposition . [1] Influenza EXPOSURE (Close Contact) within last 48 hours (2 days) AND [2] exposed person is HIGH RISK (e.g., age > 14 years, pregnant, HIV+, chronic medical condition)  Answer Assessment - Initial Assessment Questions 1. TYPE of EXPOSURE: "How were you exposed?" (e.g., close contact, not a close contact)     Granddaughter who lives with her 2. DATE of EXPOSURE: "When did the exposure occur?" (e.g., hour, days, weeks)     Diagnosed this morning 3. PREGNANCY: "Is there any chance you are pregnant?" "When was your last menstrual period?"     N/A 4. HIGH RISK for COMPLICATIONS: "Do you have any heart or lung problems? Do you have a weakened immune system?" (e.g., CHF, COPD, asthma, HIV positive, chemotherapy, renal failure, diabetes mellitus, sickle cell anemia)     DM 5. SYMPTOMS: "Do you have any symptoms?" (e.g., cough, fever, sore throat, difficulty breathing).     No  Protocols used: INFLUENZA EXPOSURE-A-AH

## 2019-03-14 NOTE — Telephone Encounter (Signed)
CRM Created 

## 2019-03-14 NOTE — Telephone Encounter (Signed)
Will you call and check on this pt/see if she developed any symptoms over the wknd?  Thanks.

## 2019-03-14 NOTE — Telephone Encounter (Signed)
Called pt left a detailed message to return my call in the office regarding her concerns of being exposed to flu from her granddaughter

## 2019-03-15 ENCOUNTER — Other Ambulatory Visit: Payer: Self-pay | Admitting: Family Medicine

## 2019-03-15 MED ORDER — BUPROPION HCL 100 MG PO TABS: 100.0000 mg | ORAL_TABLET | Freq: Every day | ORAL | 1 refills | Status: DC

## 2019-03-15 MED ORDER — ATORVASTATIN CALCIUM 20 MG PO TABS: 20.0000 mg | ORAL_TABLET | Freq: Every day | ORAL | 1 refills | Status: DC

## 2019-03-15 MED ORDER — EMPAGLIFLOZIN-METFORMIN HCL 12.5-1000 MG PO TABS: 1000.0000 mg | ORAL_TABLET | Freq: Two times a day (BID) | ORAL | 1 refills | Status: DC

## 2019-03-15 NOTE — Telephone Encounter (Signed)
I sent prescriptions to [facility] for Empagliflozin-metformin, Bupropion and Atorvastatin.  Rxs for Verapamil and Ramipril were previously sent.

## 2019-03-15 NOTE — Telephone Encounter (Signed)
Copied from [place] [id number]. Topic: Quick Communication - Rx Refill/Question >> Mar 15, 2019 11:29 AM Virl Axe D wrote: Medication: verapamil (CALAN-SR) 180 MG CR tablet / Empagliflozin-metFORMIN HCl (SYNJARDY) 12.04-999 MG TABS / buPROPion (WELLBUTRIN) 100 MG tablet / atorvastatin (LIPITOR) 20 MG tablet / Ramipril 10 MG / Pt requesting 90 day supply for all rx's  Has the patient contacted their pharmacy? Yes.   (Agent: If no, request that the patient contact the pharmacy for the refill.) (Agent: If yes, when and what did the pharmacy advise?)  Preferred Pharmacy (with phone number or street name): [place], [place] [phone] (Phone) [phone] (Fax)    Agent: Please be advised that RX refills may take up to 3 business days. We ask that you follow-up with your pharmacy.

## 2019-03-17 NOTE — Telephone Encounter (Signed)
Spoke with pt states that she still has a running nose, headache and has a heaviness feeling in her chest, pt request for advise

## 2019-03-18 NOTE — Telephone Encounter (Signed)
Pt should be evaluated if symptoms continuing

## 2019-03-21 ENCOUNTER — Telehealth: Payer: Self-pay | Admitting: Family Medicine

## 2019-03-21 ENCOUNTER — Other Ambulatory Visit: Payer: Self-pay

## 2019-03-21 MED ORDER — HYDROCHLOROTHIAZIDE 25 MG PO TABS: 25.0000 mg | ORAL_TABLET | Freq: Every day | ORAL | 0 refills | Status: DC

## 2019-03-21 NOTE — Telephone Encounter (Signed)
Spoke with pt states that she previously took [facility] SR 100 mg and she did well with it, ok to resend Rx

## 2019-03-21 NOTE — Telephone Encounter (Signed)
Copied from [place] [id number]. Topic: Quick Communication - See Telephone Encounter >> Mar 21, 2019  8:48 AM Blase Mess A wrote: CRM for notification. See Telephone encounter for: 03/21/19. Deborra Medina calling from [facility] is calling regarding buPROPion [facility]) 100 MG tablet [[id number]] is the script correct? The patient has a history of being on the sustained released. Should the script be the sustained release or is this correct of the immediate release Please advise CB [phone] Order [id number] Last attempt

## 2019-03-22 ENCOUNTER — Other Ambulatory Visit: Payer: Self-pay

## 2019-03-22 MED ORDER — EMPAGLIFLOZIN-METFORMIN HCL 12.5-1000 MG PO TABS: 1000.0000 mg | ORAL_TABLET | Freq: Two times a day (BID) | ORAL | 5 refills | Status: DC

## 2019-03-22 NOTE — Telephone Encounter (Signed)
Ok to send in sustained release.

## 2019-03-22 NOTE — Telephone Encounter (Signed)
Spoke with pt states that she feels better that previously, pt was offered advise on having a Web Ex if she needs to be see by dr Volanda Napoleon.

## 2019-03-23 ENCOUNTER — Other Ambulatory Visit: Payer: Self-pay

## 2019-03-23 MED ORDER — BUPROPION HCL ER (SR) 100 MG PO TB12: 100.0000 mg | ORAL_TABLET | Freq: Every day | ORAL | 3 refills | Status: DC

## 2019-03-23 NOTE — Telephone Encounter (Signed)
Rx was changed to pt request and faxed to pt pharmacy

## 2019-05-25 ENCOUNTER — Telehealth: Payer: Self-pay | Admitting: Family Medicine

## 2019-05-25 ENCOUNTER — Other Ambulatory Visit: Payer: Self-pay | Admitting: Family Medicine

## 2019-05-25 DIAGNOSIS — Z1231 Encounter for screening mammogram for malignant neoplasm of breast: Secondary | ICD-10-CM

## 2019-05-25 NOTE — Telephone Encounter (Signed)
The patient called in wanting to know why she had a MM3D sreening ordered for her instead of a Diagnostic mammogram this year like last year. I spoke with Dr. Volanda Napoleon and she suggested to call imaging and see why they ordered the MM3D screening for the patient.  Please Advise

## 2019-06-03 NOTE — Telephone Encounter (Signed)
Spoke with pt states that she felt a lump on her right Breast, Pt has been scheduled for an office visit on Monday 06/08/2019

## 2019-06-08 ENCOUNTER — Other Ambulatory Visit: Payer: Self-pay

## 2019-06-08 ENCOUNTER — Other Ambulatory Visit: Payer: Self-pay | Admitting: Family Medicine

## 2019-06-08 ENCOUNTER — Ambulatory Visit (INDEPENDENT_AMBULATORY_CARE_PROVIDER_SITE_OTHER): Payer: Medicare Other | Admitting: Family Medicine

## 2019-06-08 ENCOUNTER — Encounter: Payer: Self-pay | Admitting: Family Medicine

## 2019-06-08 VITALS — BP 120/60 | HR 84 | Temp 97.9°F | Wt 187.0 lb

## 2019-06-08 DIAGNOSIS — E119 Type 2 diabetes mellitus without complications: Secondary | ICD-10-CM | POA: Insufficient documentation

## 2019-06-08 DIAGNOSIS — E1169 Type 2 diabetes mellitus with other specified complication: Secondary | ICD-10-CM | POA: Insufficient documentation

## 2019-06-08 DIAGNOSIS — N63 Unspecified lump in unspecified breast: Secondary | ICD-10-CM

## 2019-06-08 DIAGNOSIS — I10 Essential (primary) hypertension: Secondary | ICD-10-CM | POA: Insufficient documentation

## 2019-06-08 LAB — POCT GLYCOSYLATED HEMOGLOBIN (HGB A1C): Hemoglobin A1C: 7.5 % — AB (ref 4.0–5.6)

## 2019-06-08 NOTE — Progress Notes (Signed)
Subjective:    Patient ID: Tara Guerrero, female    DOB: July 30, 1946, 73 y.o.   MRN: [id number]  No chief complaint on file.   HPI Patient was seen today for ongoing concern.  Pt with lump in R breast x 1 yr, but feels like it has increased in size.  Per pt area is in underneath breast.  Pt denies skin changes, erythema, nipple d/c.  Pt scheduled for mammogram in August.  DM II: pt notes snacking more.  Taking empagliflozin-metformin 12.5-1000mg .  Also on ACEI.  Pt mentions she was upset during her AWV.  Pt did not appreciate being asked if she was sexually active.  This provider explained the reasoning for question including offering STI testing.   Past Medical History:  Diagnosis Date  . Cancer ([place]) 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  . Depression   . Diabetes mellitus without complication ([place])   . Hyperlipidemia   . Hypertension     Allergies  Allergen Reactions  . Seasonal Ic [Cholestatin]     Seasonal allergies    ROS General: Denies fever, chills, night sweats, changes in weight, changes in appetite HEENT: Denies headaches, ear pain, changes in vision, rhinorrhea, sore throat CV: Denies CP, palpitations, SOB, orthopnea Pulm: Denies SOB, cough, wheezing GI: Denies abdominal pain, nausea, vomiting, diarrhea, constipation GU: Denies dysuria, hematuria, frequency, vaginal discharge  +lump in R breast Msk: Denies muscle cramps, joint pains Neuro: Denies weakness, numbness, tingling Skin: Denies rashes, bruising Psych: Denies depression, anxiety, hallucinations     Objective:    Blood pressure 120/60, pulse 84, temperature 97.9 F (36.6 C), temperature source Oral, weight 187 lb (84.8 kg), SpO2 97 %.   Gen. Pleasant, well-nourished, in no distress, normal affect   HEENT: [place]/AT, face symmetric, conjunctiva clear, no scleral icterus, PERRLA, EOMI, nares patent without drainage Lungs: no accessory muscle use, no wheezes or rales Cardiovascular: RRR,  no peripheral edema Breast:  Normal  in appearance.  No peau d'orange, erythema, nipple inversion, or lesions.  Fibrocystic changes noted b/l.  Unable to palpate a mass in area of concern noted by pt.  A 1 cm mass noted in R breast at the 8 o'clock positions, mobile. Neuro:  A&Ox3, CN II-XII intact, normal gait Skin:  Warm, no lesions/ rash   Wt Readings from Last 3 Encounters:  01/24/19 180 lb (81.6 kg)  12/14/18 180 lb (81.6 kg)  08/26/18 179 lb (81.2 kg)    Lab Results  Component Value Date   WBC 8.8 12/14/2018   HGB 14.4 12/14/2018   HCT 43.5 12/14/2018   PLT 312.0 12/14/2018   GLUCOSE 154 (H) 12/14/2018   NA 138 12/14/2018   K 4.9 12/14/2018   CL 98 12/14/2018   CREATININE 0.90 12/14/2018   BUN 19 12/14/2018   CO2 28 12/14/2018   TSH 2.52 12/14/2018    Assessment/Plan:  Lump or mass in breast -given pt's family hx and change of mass will order diagnostic mammogram. - Plan: MM Digital Diagnostic Bilat  Controlled type 2 diabetes mellitus without complication, without long-term current use of insulin (HCC)  -controlled -continue empagliflozin-metformin 12.04-999 mg -continue monitoring fsbs -continue lifestyle modifications - Plan: POC HgB A1c  F/u prn   Grier Mitts, MD

## 2019-06-08 NOTE — Patient Instructions (Addendum)
Breast Self-Awareness Breast self-awareness means being familiar with how your breasts look and feel. It involves checking your breasts regularly and reporting any changes to your health care provider. Practicing breast self-awareness is important. A change in your breasts can be a sign of a serious medical problem. Being familiar with how your breasts look and feel allows you to find any problems early, when treatment is more likely to be successful. All women should practice breast self-awareness, including women who have had breast implants. How to do a breast self-exam One way to learn what is normal for your breasts and whether your breasts are changing is to do a breast self-exam. To do a breast self-exam: Look for Changes  1. Remove all the clothing above your waist. 2. Stand in front of a mirror in a room with good lighting. 3. Put your hands on your hips. 4. Push your hands firmly downward. 5. Compare your breasts in the mirror. Look for differences between them (asymmetry), such as: ? Differences in shape. ? Differences in size. ? Puckers, dips, and bumps in one breast and not the other. 6. Look at each breast for changes in your skin, such as: ? Redness. ? Scaly areas. 7. Look for changes in your nipples, such as: ? Discharge. ? Bleeding. ? Dimpling. ? Redness. ? A change in position. Feel for Changes Carefully feel your breasts for lumps and changes. It is best to do this while lying on your back on the floor and again while sitting or standing in the shower or tub with soapy water on your skin. Feel each breast in the following way:  Place the arm on the side of the breast you are examining above your head.  Feel your breast with the other hand.  Start in the nipple area and make  inch (2 cm) overlapping circles to feel your breast. Use the pads of your three middle fingers to do this. Apply light pressure, then medium pressure, then firm pressure. The light pressure  will allow you to feel the tissue closest to the skin. The medium pressure will allow you to feel the tissue that is a little deeper. The firm pressure will allow you to feel the tissue close to the ribs.  Continue the overlapping circles, moving downward over the breast until you feel your ribs below your breast.  Move one finger-width toward the center of the body. Continue to use the  inch (2 cm) overlapping circles to feel your breast as you move slowly up toward your collarbone.  Continue the up and down exam using all three pressures until you reach your armpit.  Write Down What You Find  Write down what is normal for each breast and any changes that you find. Keep a written record with breast changes or normal findings for each breast. By writing this information down, you do not need to depend only on memory for size, tenderness, or location. Write down where you are in your menstrual cycle, if you are still menstruating. If you are having trouble noticing differences in your breasts, do not get discouraged. With time you will become more familiar with the variations in your breasts and more comfortable with the exam. How often should I examine my breasts? Examine your breasts every month. If you are breastfeeding, the best time to examine your breasts is after a feeding or after using a breast pump. If you menstruate, the best time to examine your breasts is 5-7 days after your period  is over. During your period, your breasts are lumpier, and it may be more difficult to notice changes. When should I see my health care provider? See your health care provider if you notice:  A change in shape or size of your breasts or nipples.  A change in the skin of your breast or nipples, such as a reddened or scaly area.  Unusual discharge from your nipples.  A lump or thick area that was not there before.  Pain in your breasts.  Anything that concerns you. This information is not intended to  replace advice given to you by your health care provider. Make sure you discuss any questions you have with your health care provider. Document Released: 12/15/2005 Document Revised: 05/22/2016 Document Reviewed: 11/04/2015 Elsevier Interactive Patient Education  2019 [employer]  Preventing Diabetes Mellitus Complications You can take action to prevent or slow down problems that are caused by diabetes (diabetes mellitus). Following your diabetes plan and taking care of yourself can reduce your risk of serious or life-threatening complications. What actions can I take to prevent diabetes complications? Manage your diabetes   Follow instructions from your health care providers about managing your diabetes. Your diabetes may be managed by a team of health care providers who can teach you how to care for yourself and can answer questions that you have.  Educate yourself about your condition so you can make healthy choices about eating and physical activity.  Check your blood sugar (glucose) levels as often as directed. Your health care provider will help you decide how often to check your blood glucose level depending on your treatment goals and how well you are meeting them.  Ask your health care provider if you should take low-dose aspirin daily and what dose is recommended for you. Taking low-dose aspirin daily is recommended to help prevent cardiovascular disease. Do not use nicotine or tobacco Do not use any products that contain nicotine or tobacco, such as cigarettes and e-cigarettes. If you need help quitting, ask your health care provider. Nicotine raises your risk for diabetes problems. If you quit using nicotine:  You will lower your risk for heart attack, stroke, nerve disease, and kidney disease.  Your cholesterol and blood pressure may improve.  Your blood circulation will improve. Keep your blood pressure under control Your personal target blood pressure is determined based  on:  Your age.  Your medicines.  How long you have had diabetes.  Any other medical conditions you have. To control your blood pressure:  Follow instructions from your health care provider about meal planning, exercise, and medicines.  Make sure your health care provider checks your blood pressure at every medical visit.  Monitor your blood pressure at home as told by your health care provider.  Keep your cholesterol under control To control your cholesterol:  Follow instructions from your health care provider about meal planning, exercise, and medicines.  Have your cholesterol checked at least once a year.  You may be prescribed medicine to lower cholesterol (statin). If you are not taking a statin, ask your health care provider if you should be. Controlling your cholesterol may:  Help prevent heart disease and stroke. These are the most common health problems for people with diabetes.  Improve your blood flow. Schedule and keep yearly physical exams and eye exams Your health care provider will tell you how often you need medical visits depending on your diabetes management plan. Keep all follow-up visits as directed. This is important so possible  problems can be identified early and complications can be avoided or treated.  Every visit with your health care provider should include measuring your: ? Weight. ? Blood pressure. ? Blood glucose control.  Your A1c (hemoglobin A1c) level should be checked: ? At least 2 times a year, if you are meeting your treatment goals. ? 4 times a year, if you are not meeting treatment goals or if your treatment goals have changed.  Your blood lipids (lipid profile) should be checked yearly. You should also be checked yearly for protein in your urine (urine microalbumin).  If you have type 1 diabetes, get an eye exam 3-5 years after you are diagnosed, and then once a year after your first exam.  If you have type 2 diabetes, get an eye  exam as soon as you are diagnosed, and then once a year after your first exam. Keep your vaccines current It is recommended that you receive:  A flu (influenza) vaccine every year.  A pneumonia (pneumococcal) vaccine and a hepatitis B vaccine. If you are age 28 or older, you may get the pneumonia vaccine as a series of two separate shots. Ask your health care provider which other vaccines may be recommended. Take care of your feet Diabetes may cause you to have poor blood circulation to your legs and feet. Because of this, taking care of your feet is very important. Diabetes can cause:  The skin on the feet to get thinner, break more easily, and heal more slowly.  Nerve damage in your legs and feet, which results in decreased feeling. You may not notice minor injuries that could lead to serious problems. To avoid foot problems:  Check your skin and feet every day for cuts, bruises, redness, blisters, or sores.  Schedule a foot exam with your health care provider once every year. This exam includes: ? Inspecting of the structure and skin of your feet. ? Checking the pulses and sensation in your feet.  Make sure that your health care provider performs a visual foot exam at every medical visit.  Take care of your teeth People with poorly controlled diabetes are more likely to have gum (periodontal) disease. Diabetes can make periodontal diseases harder to control. If not treated, periodontal diseases can lead to tooth loss. To prevent this:  Brush your teeth twice a day.  Floss at least once a day.  Visit your dentist 2 times a year. Drink responsibly Limit alcohol intake to no more than 1 drink a day for nonpregnant women and 2 drinks a day for men. One drink equals 12 oz of beer, 5 oz of wine, or 1 oz of hard liquor.  It is important to eat food when you drink alcohol to avoid low blood glucose (hypoglycemia). Avoid alcohol if you:  Have a history of alcohol abuse or  dependence.  Are pregnant.  Have liver disease, pancreatitis, advanced neuropathy, or severe hypertriglyceridemia. Lessen stress Living with diabetes can be stressful. When you are experiencing stress, your blood glucose may be affected in two ways:  Stress hormones may cause your blood glucose to rise.  You may be distracted from taking good care of yourself. Be aware of your stress level and make changes to help you manage challenging situations. To lower your stress levels:  Consider joining a support group.  Do planned relaxation or meditation.  Do a hobby that you enjoy.  Maintain healthy relationships.  Exercise regularly.  Work with your health care provider or a mental health  professional. Summary  You can take action to prevent or slow down problems that are caused by diabetes (diabetes mellitus). Following your diabetes plan and taking care of yourself can reduce your risk of serious or life-threatening complications.  Follow instructions from your health care providers about managing your diabetes. Your diabetes may be managed by a team of health care providers who can teach you how to care for yourself and can answer questions that you have.  Your health care provider will tell you how often you need medical visits depending on your diabetes management plan. Keep all follow-up visits as directed. This is important so possible problems can be identified early and complications can be avoided or treated. This information is not intended to replace advice given to you by your health care provider. Make sure you discuss any questions you have with your health care provider. Document Released: 09/02/2011 Document Revised: 08/04/2017 Document Reviewed: 09/13/2016 Elsevier Interactive Patient Education  2019 [employer].

## 2019-06-22 ENCOUNTER — Other Ambulatory Visit: Payer: Self-pay

## 2019-06-22 ENCOUNTER — Ambulatory Visit
Admission: RE | Admit: 2019-06-22 | Discharge: 2019-06-22 | Disposition: A | Discharge status: Home / Self Care | Payer: Medicare Other | Source: Ambulatory Visit | Attending: Family Medicine | Admitting: Family Medicine

## 2019-06-22 DIAGNOSIS — N63 Unspecified lump in unspecified breast: Secondary | ICD-10-CM

## 2019-08-05 ENCOUNTER — Encounter: Payer: Self-pay | Admitting: Family Medicine

## 2019-08-11 ENCOUNTER — Other Ambulatory Visit: Payer: Self-pay

## 2019-08-11 ENCOUNTER — Ambulatory Visit
Admission: RE | Admit: 2019-08-11 | Discharge: 2019-08-11 | Disposition: A | Discharge status: Home / Self Care | Payer: Medicare Other | Source: Ambulatory Visit | Attending: Family Medicine | Admitting: Family Medicine

## 2019-08-11 DIAGNOSIS — Z13820 Encounter for screening for osteoporosis: Secondary | ICD-10-CM

## 2019-08-11 LAB — HM DEXA SCAN

## 2019-09-02 ENCOUNTER — Encounter: Payer: Self-pay | Admitting: Family Medicine

## 2019-09-17 ENCOUNTER — Other Ambulatory Visit: Payer: Self-pay | Admitting: Family Medicine

## 2019-09-28 ENCOUNTER — Encounter: Payer: Self-pay | Admitting: Family Medicine

## 2019-10-28 ENCOUNTER — Telehealth (INDEPENDENT_AMBULATORY_CARE_PROVIDER_SITE_OTHER): Payer: Medicare Other | Admitting: Family Medicine

## 2019-10-28 ENCOUNTER — Other Ambulatory Visit: Payer: Medicare Other

## 2019-10-28 ENCOUNTER — Other Ambulatory Visit: Payer: Self-pay

## 2019-10-28 DIAGNOSIS — E119 Type 2 diabetes mellitus without complications: Secondary | ICD-10-CM | POA: Diagnosis not present

## 2019-10-28 LAB — COMPREHENSIVE METABOLIC PANEL
ALT: 26 U/L (ref 0–35)
AST: 20 U/L (ref 0–37)
Albumin: 4.6 g/dL (ref 3.5–5.2)
Alkaline Phosphatase: 72 U/L (ref 39–117)
BUN: 21 mg/dL (ref 6–23)
CO2: 30 mEq/L (ref 19–32)
Calcium: 10.1 mg/dL (ref 8.4–10.5)
Chloride: 98 mEq/L (ref 96–112)
Creatinine, Ser: 0.85 mg/dL (ref 0.40–1.20)
GFR: 65.2 mL/min (ref >60.00)
Glucose, Bld: 132 mg/dL — ABNORMAL HIGH (ref 70–99)
Potassium: 4.1 mEq/L (ref 3.5–5.1)
Sodium: 139 mEq/L (ref 135–145)
Total Bilirubin: 0.5 mg/dL (ref 0.2–1.2)
Total Protein: 7 g/dL (ref 6.0–8.3)

## 2019-10-28 LAB — MICROALBUMIN / CREATININE URINE RATIO
Creatinine,U: 21.3 mg/dL
Microalb Creat Ratio: 3.3 mg/g (ref 0.0–30.0)
Microalb, Ur: <0.7 mg/dL (ref 0.0–1.9)

## 2019-10-28 LAB — VITAMIN B12: Vitamin B-12: 316 pg/mL (ref 211–911)

## 2019-10-28 LAB — HEMOGLOBIN A1C: Hgb A1c MFr Bld: 7.9 % — ABNORMAL HIGH (ref 4.6–6.5)

## 2019-10-28 NOTE — Progress Notes (Signed)
Virtual Visit via Video Note  I connected with Tara Guerrero on 10/28/19 at  8:30 AM EDT by a video enabled telemedicine application 2/2 [id number] pandemic and verified that I am speaking with the correct person using two identifiers.  Location patient: home Location provider:work or home office Persons participating in the virtual visit: patient, provider  I discussed the limitations of evaluation and management by telemedicine and the availability of in person appointments. The patient expressed understanding and agreed to proceed.   HPI: Pt is a 73 yo female with pmh sig for HTN and DM.  Pt states bs is 259 in am.  After eating went up to 285.  Lowest bs 160.  Pt notes increased bs x 1 month.  Pt endorses being a stress eater.  Will eat more Carbs when stressed.  Pt ate spinach and shrimp for breakfast, salad with pinto beans and cheese for lunch, an apple for snack, and a BLT sandwich for dinner. Drinks 6 cups of coffee with creamer per day and water.  Taking Jardiance-Metformin 12.04-999 mg BID.  Pt endorses some dizziness.  Pt denies increased thirst, hunger, or frequent urination.   ROS: See pertinent positives and negatives per HPI.  Past Medical History:  Diagnosis Date  . 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 Depression   . Diabetes mellitus without complication ([place])   . Hyperlipidemia   . Hypertension     Past Surgical History:  Procedure Laterality Date  . ABDOMINAL HYSTERECTOMY  1976   s/p uterine CA  . BREAST EXCISIONAL BIOPSY Bilateral   . BREAST SURGERY  1995   L breast node removal  . BREAST SURGERY     R breast biopsy  . EYE MUSCLE SURGERY  2019  . EYE SURGERY  2017   bilateral cataract surgery  . JOINT REPLACEMENT  2004   bilateral knee replacements  . TONSILLECTOMY  1950    Family History  Problem Relation Age of Onset  . Cancer Mother   . Hyperlipidemia Mother   . Hypertension Mother   . Stroke Mother   . Breast cancer Mother        diagnosed in her 23's   . Alcohol abuse Father   . Cancer Sister   . Hyperlipidemia Sister   . Hypertension Sister   . Stroke Sister   . Breast cancer Sister        diagnosed in her 41's  . Mental illness Maternal Grandmother   . Hypertension Maternal Grandmother   . Hyperlipidemia Maternal Grandmother   . Mental retardation Maternal Grandmother   . Hearing loss Maternal Grandfather   . Stroke Maternal Grandfather   . Alcohol abuse Paternal Grandmother   . Alcohol abuse Paternal Grandfather   . Breast cancer Cousin        diagnosed in her 62's  . Breast cancer Sister 68  . Breast cancer Other        diagnosed late 20's     Current Outpatient Medications:  .  aspirin EC 81 MG tablet, Take 81 mg by mouth daily., Disp: , Rfl:  .  atorvastatin (LIPITOR) 20 MG tablet, Take 1 tablet (20 mg total) by mouth daily., Disp: 90 tablet, Rfl: 1 .  Blood Glucose Monitoring Suppl (ONE TOUCH ULTRA 2) w/Device KIT, Use to Check Blood glucose daily.( Dx:Z51.81), Disp: 1 each, Rfl: 0 .  buPROPion (WELLBUTRIN SR) 100 MG 12 hr tablet, Take 1 tablet (100 mg total) by mouth daily., Disp: 90 tablet, Rfl:  3 .  cetirizine (ZYRTEC) 10 MG chewable tablet, Chew 10 mg by mouth daily., Disp: , Rfl:  .  Empagliflozin-metFORMIN HCl (SYNJARDY) 12.04-999 MG TABS, Take 1,000 mg by mouth 2 (two) times daily., Disp: 180 tablet, Rfl: 5 .  hydrochlorothiazide (HYDRODIURIL) 25 MG tablet, Take 1 tablet (25 mg total) by mouth daily. Take 1/2 tablet by mouth daily, Disp: 90 tablet, Rfl: 0 .  ramipril (ALTACE) 10 MG capsule, TAKE 1 CAPSULE BY MOUTH  DAILY, Disp: 90 capsule, Rfl: 3 .  verapamil (CALAN-SR) 180 MG CR tablet, TAKE 1 TABLET BY MOUTH AT  BEDTIME, Disp: 90 tablet, Rfl: 1  EXAM:  VITALS per patient if applicable: RR between 59-93 bpm,  wt 188 lbs  GENERAL: alert, oriented, appears well and in no acute distress  HEENT: atraumatic, conjunctiva clear, no obvious abnormalities on inspection of external nose and ears  NECK: normal  movements of the head and neck  LUNGS: on inspection no signs of respiratory distress, breathing rate appears normal, no obvious gross SOB, gasping or wheezing  CV: no obvious cyanosis  MS: moves all visible extremities without noticeable abnormality  PSYCH/NEURO: pleasant and cooperative, no obvious depression or anxiety, speech and thought processing grossly intact  ASSESSMENT AND PLAN:  Discussed the following assessment and plan:  Type 2 diabetes mellitus without complication, without long-term current use of insulin (HCC)  -uncontrolled.  Elevated readings at home -discussed obtaining labs as will need to make adjustments in regimen -continue Empagliflozin-metformin 12.04-999 mg BID.   -advised will likely need to add additional medication such as a Sulfonylurea (glipizide) or GLP1 agonist (victoza, bydureon, or trulicity) - Plan: Comprehensive metabolic panel, Vitamin T70, Hemoglobin A1c, Microalbumin/Creatinine Ratio, Urine -given precautions  F/u next week after labs resulted.   I discussed the assessment and treatment plan with the patient. The patient was provided an opportunity to ask questions and all were answered. The patient agreed with the plan and demonstrated an understanding of the instructions.   The patient was advised to call back or seek an in-person evaluation if the symptoms worsen or if the condition fails to improve as anticipated.   Billie Ruddy, MD

## 2019-11-18 DIAGNOSIS — H905 Unspecified sensorineural hearing loss: Secondary | ICD-10-CM | POA: Diagnosis not present

## 2019-12-19 DIAGNOSIS — L570 Actinic keratosis: Secondary | ICD-10-CM | POA: Diagnosis not present

## 2019-12-19 DIAGNOSIS — C44311 Basal cell carcinoma of skin of nose: Secondary | ICD-10-CM | POA: Diagnosis not present

## 2019-12-19 DIAGNOSIS — L905 Scar conditions and fibrosis of skin: Secondary | ICD-10-CM | POA: Diagnosis not present

## 2020-01-11 ENCOUNTER — Encounter: Payer: Self-pay | Admitting: Family Medicine

## 2020-01-13 ENCOUNTER — Encounter: Payer: Self-pay | Admitting: Family Medicine

## 2020-01-16 ENCOUNTER — Other Ambulatory Visit: Payer: Self-pay

## 2020-01-16 MED ORDER — BUPROPION HCL 100 MG PO TABS: 100.0000 mg | ORAL_TABLET | Freq: Every day | ORAL | 1 refills | Status: DC

## 2020-01-23 ENCOUNTER — Telehealth: Payer: Self-pay | Admitting: Family Medicine

## 2020-01-23 ENCOUNTER — Other Ambulatory Visit: Payer: Self-pay

## 2020-01-23 NOTE — Telephone Encounter (Signed)
Pt stated that [facility] send over verification for her Wellbutrin prescription.   [facility] said they are waiting on response from office and cannot refill until they hear back.  Please advise.

## 2020-01-23 NOTE — Telephone Encounter (Signed)
Spoke with pt states that she will keep the Wellbutrin SR since she received the refill on the 1/18/29021

## 2020-01-26 ENCOUNTER — Other Ambulatory Visit: Payer: Self-pay

## 2020-01-26 ENCOUNTER — Ambulatory Visit (INDEPENDENT_AMBULATORY_CARE_PROVIDER_SITE_OTHER): Payer: Medicare Other

## 2020-01-26 VITALS — Ht 65.0 in | Wt 180.0 lb

## 2020-01-26 DIAGNOSIS — Z0000 Encounter for general adult medical examination without abnormal findings: Secondary | ICD-10-CM

## 2020-01-26 MED ORDER — BUPROPION HCL ER (SR) 100 MG PO TB12: 100.0000 mg | ORAL_TABLET | Freq: Every day | ORAL | 0 refills | Status: DC

## 2020-01-26 NOTE — Patient Instructions (Signed)
Tara Guerrero , Thank you for taking time to participate in your Medicare Wellness Visit. I appreciate your ongoing commitment to your health goals. Please review the following plan we discussed and let me know if I can assist you in the future.   Screening recommendations/referrals: Colorectal Screening: last one done 07/27/2014; due 5 years per patient. Will provide her with information to endoscopy providers in area and she will contact us with her referral choice. Her previous one was done in [place]. Mammogram: completed 06/22/2019. Due again 06/22/2020. Bone Density: completed 08/11/2019.   Vision and Dental Exams: Recommended annual ophthalmology exams for early detection of glaucoma and other disorders of the eye Recommended annual dental exams for proper oral hygiene  Diabetic Exams: Diabetic Eye Exam: She states she has yearly eye exams. Diabetic Foot Exam: due next in office visit.   Vaccinations: Influenza vaccine: completed 09/23/2019. Due again Fall 2021.  Pneumococcal vaccine: patient reports this was completed by previous PCP in [place] but records for this are not available.  Tdap vaccine: patient reports this was done in 2019 but I do not have record. Shingles vaccine: Patient reports this was done in [place] but I do not have those records.  Advanced directives: Advance directives discussed with you today. I have provided a copy for you to complete at home and have notarized. Once this is complete please bring a copy in to our office so we can scan it into your chart.  Goals: Recommend to drink at least 6-8 8oz glasses of water per day.  Recommend to exercise for at least 150 minutes per week.  Recommend to remove any items from the home that may cause slips or trips.  Next appointment: Please schedule your Annual Wellness Visit with your Nurse Health Advisor in one year.  Preventive Care 49 Years and Older, Female Preventive care refers to lifestyle choices and visits  with your health care provider that can promote health and wellness. What does preventive care include?  A yearly physical exam. This is also called an annual well check.  Dental exams once or twice a year.  Routine eye exams. Ask your health care provider how often you should have your eyes checked.  Personal lifestyle choices, including:  Daily care of your teeth and gums.  Regular physical activity.  Eating a healthy diet.  Avoiding tobacco and drug use.  Limiting alcohol use.  Practicing safe sex.  Taking low-dose aspirin every day if recommended by your health care provider.  Taking vitamin and mineral supplements as recommended by your health care provider. What happens during an annual well check? The services and screenings done by your health care provider during your annual well check will depend on your age, overall health, lifestyle risk factors, and family history of disease. Counseling  Your health care provider may ask you questions about your:  Alcohol use.  Tobacco use.  Drug use.  Emotional well-being.  Home and relationship well-being.  Sexual activity.  Eating habits.  History of falls.  Memory and ability to understand (cognition).  Work and work Statistician.  Reproductive health. Screening  You may have the following tests or measurements:  Height, weight, and BMI.  Blood pressure.  Lipid and cholesterol levels. These may be checked every 5 years, or more frequently if you are over 77 years old.  Skin check.  Lung cancer screening. You may have this screening every year starting at age 62 if you have a 30-pack-year history of smoking and currently smoke  or have quit within the past 15 years.  Fecal occult blood test (FOBT) of the stool. You may have this test every year starting at age 41.  Flexible sigmoidoscopy or colonoscopy. You may have a sigmoidoscopy every 5 years or a colonoscopy every 10 years starting at age  71.  Hepatitis C blood test.  Hepatitis B blood test.  Sexually transmitted disease (STD) testing.  Diabetes screening. This is done by checking your blood sugar (glucose) after you have not eaten for a while (fasting). You may have this done every 1-3 years.  Bone density scan. This is done to screen for osteoporosis. You may have this done starting at age 66.  Mammogram. This may be done every 1-2 years. Talk to your health care provider about how often you should have regular mammograms. Talk with your health care provider about your test results, treatment options, and if necessary, the need for more tests. Vaccines  Your health care provider may recommend certain vaccines, such as:  Influenza vaccine. This is recommended every year.  Tetanus, diphtheria, and acellular pertussis (Tdap, Td) vaccine. You may need a Td booster every 10 years.  Zoster vaccine. You may need this after age 47.  Pneumococcal 13-valent conjugate (PCV13) vaccine. One dose is recommended after age 54.  Pneumococcal polysaccharide (PPSV23) vaccine. One dose is recommended after age 42. Talk to your health care provider about which screenings and vaccines you need and how often you need them. This information is not intended to replace advice given to you by your health care provider. Make sure you discuss any questions you have with your health care provider. Document Released: 01/11/2016 Document Revised: 09/03/2016 Document Reviewed: 10/16/2015 Elsevier Interactive Patient Education  2017 [place] Prevention in the Home Falls can cause injuries. They can happen to people of all ages. There are many things you can do to make your home safe and to help prevent falls. What can I do on the outside of my home?  Regularly fix the edges of walkways and driveways and fix any cracks.  Remove anything that might make you trip as you walk through a door, such as a raised step or threshold.  Trim  any bushes or trees on the path to your home.  Use bright outdoor lighting.  Clear any walking paths of anything that might make someone trip, such as rocks or tools.  Regularly check to see if handrails are loose or broken. Make sure that both sides of any steps have handrails.  Any raised decks and porches should have guardrails on the edges.  Have any leaves, snow, or ice cleared regularly.  Use sand or salt on walking paths during winter.  Clean up any spills in your garage right away. This includes oil or grease spills. What can I do in the bathroom?  Use night lights.  Install grab bars by the toilet and in the tub and shower. Do not use towel bars as grab bars.  Use non-skid mats or decals in the tub or shower.  If you need to sit down in the shower, use a plastic, non-slip stool.  Keep the floor dry. Clean up any water that spills on the floor as soon as it happens.  Remove soap buildup in the tub or shower regularly.  Attach bath mats securely with double-sided non-slip rug tape.  Do not have throw rugs and other things on the floor that can make you trip. What can I do in the  bedroom?  Use night lights.  Make sure that you have a light by your bed that is easy to reach.  Do not use any sheets or blankets that are too big for your bed. They should not hang down onto the floor.  Have a firm chair that has side arms. You can use this for support while you get dressed.  Do not have throw rugs and other things on the floor that can make you trip. What can I do in the kitchen?  Clean up any spills right away.  Avoid walking on wet floors.  Keep items that you use a lot in easy-to-reach places.  If you need to reach something above you, use a strong step stool that has a grab bar.  Keep electrical cords out of the way.  Do not use floor polish or wax that makes floors slippery. If you must use wax, use non-skid floor wax.  Do not have throw rugs and other  things on the floor that can make you trip. What can I do with my stairs?  Do not leave any items on the stairs.  Make sure that there are handrails on both sides of the stairs and use them. Fix handrails that are broken or loose. Make sure that handrails are as long as the stairways.  Check any carpeting to make sure that it is firmly attached to the stairs. Fix any carpet that is loose or worn.  Avoid having throw rugs at the top or bottom of the stairs. If you do have throw rugs, attach them to the floor with carpet tape.  Make sure that you have a light switch at the top of the stairs and the bottom of the stairs. If you do not have them, ask someone to add them for you. What else can I do to help prevent falls?  Wear shoes that:  Do not have high heels.  Have rubber bottoms.  Are comfortable and fit you well.  Are closed at the toe. Do not wear sandals.  If you use a stepladder:  Make sure that it is fully opened. Do not climb a closed stepladder.  Make sure that both sides of the stepladder are locked into place.  Ask someone to hold it for you, if possible.  Clearly mark and make sure that you can see:  Any grab bars or handrails.  First and last steps.  Where the edge of each step is.  Use tools that help you move around (mobility aids) if they are needed. These include:  Canes.  Walkers.  Scooters.  Crutches.  Turn on the lights when you go into a dark area. Replace any light bulbs as soon as they burn out.  Set up your furniture so you have a clear path. Avoid moving your furniture around.  If any of your floors are uneven, fix them.  If there are any pets around you, be aware of where they are.  Review your medicines with your doctor. Some medicines can make you feel dizzy. This can increase your chance of falling. Ask your doctor what other things that you can do to help prevent falls. This information is not intended to replace advice given to  you by your health care provider. Make sure you discuss any questions you have with your health care provider. Document Released: 10/11/2009 Document Revised: 05/22/2016 Document Reviewed: 01/19/2015 Elsevier Interactive Patient Education  2017 [employer].

## 2020-01-26 NOTE — Progress Notes (Signed)
This visit is being conducted via phone call due to the COVID-19 pandemic. This patient has given me verbal consent via phone to conduct this visit, patient states they are participating from their home address. Some vital signs may be absent or patient reported.   Patient identification: identified by name, DOB, and current address.  Location provider: [facility], Office Persons participating in the virtual visit: Franne Forts, LPN and Ms. Georges Lynch   Subjective:   Tara Guerrero is a 74 y.o. female who presents for Medicare Annual (Subsequent) preventive examination.  Review of Systems:  No ROS; Annual Medicare Wellness Subsequent visit  Cardiac Risk Factors include: sedentary lifestyle;dyslipidemia;advanced age (>30mn, >>25women);hypertension    Objective:    Vitals: Ht 5' 5" (1.651 m)   Wt 180 lb (81.6 kg)   BMI 29.95 kg/m   Body mass index is 29.95 kg/m.  Advanced Directives 01/26/2020 01/24/2019  Does Patient Have a Medical Advance Directive? No Yes  [place] of [place]  Does patient want to make changes to medical advance directive? - Yes (MAU/Ambulatory/Procedural Areas - Information given)  [place] of [place]? - No - copy requested  Would patient like information on creating a medical advance directive? Yes (MAU/Ambulatory/Procedural Areas - Information given) -    Tobacco Social History   Tobacco Use  Smoking Status Former Smoker  Smokeless Tobacco Never Used     Counseling given: Not Answered   Clinical Intake:  Pre-visit preparation completed: Yes  Pain : 0-10 Pain Type: Acute pain Pain Location: Head Pain Radiating Towards: forehead Pain Descriptors / Indicators: Aching Pain Onset: Yesterday     BMI - recorded: 29.95 Nutritional Status: BMI 25 -29 Overweight Nutritional Risks: None Diabetes: Yes CBG done?: (155 this morning) Did pt. bring in CBG monitor from home?:  No(telephone visit)  What is the last grade level you completed in school?: 2 years college  Interpreter Needed?: No  Information entered by :: CFranne Forts LPN.  Past Medical History:  Diagnosis Date  . Cancer ([place] 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  . Depression   . Diabetes mellitus without complication ([place]   . Hyperlipidemia   . Hypertension    Past Surgical History:  Procedure Laterality Date  . ABDOMINAL HYSTERECTOMY  1976   s/p uterine CA  . BREAST EXCISIONAL BIOPSY Bilateral   . BREAST SURGERY  1995   L breast node removal  . BREAST SURGERY     R breast biopsy  . EYE MUSCLE SURGERY  2019  . EYE SURGERY  2017   bilateral cataract surgery  . JOINT REPLACEMENT  2004   bilateral knee replacements  . TONSILLECTOMY  1950   Family History  Problem Relation Age of Onset  . Cancer Mother   . Hyperlipidemia Mother   . Hypertension Mother   . Stroke Mother   . Breast cancer Mother        diagnosed in her 757's . Alcohol abuse Father   . Cancer Sister   . Hyperlipidemia Sister   . Hypertension Sister   . Stroke Sister   . Breast cancer Sister        diagnosed in her 431's . Mental illness Maternal Grandmother   . Hypertension Maternal Grandmother   . Hyperlipidemia Maternal Grandmother   . Mental retardation Maternal Grandmother   . Hearing loss Maternal Grandfather   . Stroke Maternal Grandfather   . Alcohol abuse Paternal Grandmother   .  Alcohol abuse Paternal Grandfather   . Breast cancer Cousin        diagnosed in her 13's  . Breast cancer Sister 45  . Breast cancer Other        diagnosed late 20's   Social History   Socioeconomic History  . Marital status: Single    Spouse name: Not on file  . Number of children: 1  . Years of education: 47  . Highest education level: Associate degree: occupational, Hotel manager, or vocational program  Occupational History  . Occupation: retired  Tobacco Use  . Smoking status: Former Research scientist (life sciences)  . Smokeless tobacco: Never Used    Substance and Sexual Activity  . Alcohol use: Yes    Alcohol/week: 1.0 standard drinks    Types: 1 Glasses of wine per week  . Drug use: Never  . Sexual activity: Not Currently  Other Topics Concern  . Not on file  Social History Narrative   01/24/2019: Lives with granddaughter and dog.    Retired    Scientist, physiological Strain: [place]   . Difficulty of Paying Living Expenses: Not hard at all  Food Insecurity: No Food Insecurity  . Worried About Charity fundraiser in the Last Year: Never true  . Ran Out of Food in the Last Year: Never true  Transportation Needs: No Transportation Needs  . Lack of Transportation (Medical): No  . Lack of Transportation (Non-Medical): No  Physical Activity: Sufficiently Active  . Days of Exercise per Week: 7 days  . Minutes of Exercise per Session: 60 min  Stress: No Stress Concern Present  . Feeling of Stress : Only a little  Social Connections: Slightly Isolated  . Frequency of Communication with Friends and Family: More than three times a week  . Frequency of Social Gatherings with Friends and Family: Once a week  . Attends Religious Services: More than 4 times per year  . Active Member of Clubs or Organizations: Yes  . Attends Archivist Meetings: More than 4 times per year  . Marital Status: Divorced    Outpatient Encounter Medications as of 01/26/2020  Medication Sig  . APPLE CIDER VINEGAR PO Take 2 tablets by mouth 2 (two) times daily.  . Ascorbic Acid (VITAMIN C) 1000 MG tablet Take 1,000 mg by mouth daily.  Marland Kitchen aspirin EC 81 MG tablet Take 81 mg by mouth daily.  Marland Kitchen atorvastatin (LIPITOR) 20 MG tablet Take 1 tablet (20 mg total) by mouth daily.  . Blood Glucose Monitoring Suppl (ONE TOUCH ULTRA 2) w/Device KIT Use to Check Blood glucose daily.( Dx:Z51.81)  . buPROPion (WELLBUTRIN SR) 100 MG 12 hr tablet Take 1 tablet (100 mg total) by mouth daily.  . Calcium Carb-Cholecalciferol (CALCIUM 500/D)  500-400 MG-UNIT CHEW Chew by mouth daily.  . cetirizine (ZYRTEC) 10 MG chewable tablet Chew 10 mg by mouth daily.  . Cholecalciferol (VITAMIN D) 50 MCG (2000 UT) tablet Take 2,000 Units by mouth daily.  . Empagliflozin-metFORMIN HCl (SYNJARDY) 12.04-999 MG TABS Take 1,000 mg by mouth 2 (two) times daily.  . ferrous sulfate 324 (65 Fe) MG TBEC Take by mouth daily.  Marland Kitchen guaiFENesin (MUCINEX) 600 MG 12 hr tablet Take 600 mg by mouth 2 (two) times daily.  . Lactobacillus (PROBIOTIC ACIDOPHILUS PO) Take by mouth daily.  . Multiple Vitamins-Minerals (HAIR SKIN AND NAILS FORMULA PO) Take by mouth daily.  . ramipril (ALTACE) 10 MG capsule TAKE 1 CAPSULE BY MOUTH  DAILY  .  verapamil (CALAN-SR) 180 MG CR tablet TAKE 1 TABLET BY MOUTH AT  BEDTIME  . hydrochlorothiazide (HYDRODIURIL) 25 MG tablet Take 1 tablet (25 mg total) by mouth daily. Take 1/2 tablet by mouth daily (Patient not taking: Reported on 01/26/2020)  . [DISCONTINUED] buPROPion (WELLBUTRIN) 100 MG tablet Take 1 tablet (100 mg total) by mouth daily.   No facility-administered encounter medications on file as of 01/26/2020.    Activities of Daily Living In your present state of health, do you have any difficulty performing the following activities: 01/26/2020  Hearing? N  Vision? N  Difficulty concentrating or making decisions? N  Walking or climbing stairs? N  Dressing or bathing? N  Doing errands, shopping? N  Preparing Food and eating ? N  Using the Toilet? N  In the past six months, have you accidently leaked urine? Y  Do you have problems with loss of bowel control? N  Managing your Medications? N  Managing your Finances? N  Housekeeping or managing your Housekeeping? N  Some recent data might be hidden    Patient Care Team: Billie Ruddy, MD as PCP - General (Family Medicine)    Assessment:   This is a routine wellness examination for Meggie.  Exercise Activities and Dietary recommendations Current Exercise Habits: The  patient does not participate in regular exercise at present, Exercise limited by: None identified  Goals    . Patient Stated     Avoid covid       Fall Risk Fall Risk  01/26/2020 01/24/2019  Falls in the past year? 0 0  Risk for fall due to : Medication side effect -  Follow up Falls evaluation completed;Education provided;Falls prevention discussed -   Is the patient's home free of loose throw rugs in walkways, pet beds, electrical cords, etc?   yes      Grab bars in the bathroom? yes      Handrails on the stairs?   yes      Adequate lighting?   yes  Timed Get Up and Go performed: N/A due to telephone visit  Depression Screen PHQ 2/9 Scores 01/26/2020 01/24/2019  PHQ - 2 Score 0 -  Exception Documentation - Patient refusal     Cognitive Function     6CIT Screen 01/26/2020  What Year? 0 points  What month? 0 points  What time? 0 points  Count back from 20 0 points  Months in reverse 0 points  Repeat phrase 0 points  Total Score 0    Immunization History  Administered Date(s) Administered  . Fluad Quad(high Dose 65+) 09/23/2019  . Influenza-Unspecified 09/27/2015    Qualifies for Shingles Vaccine?patient states this was previously done in [place] but I do not have those records   Screening Tests Health Maintenance  Topic Date Due  . Hepatitis C Screening  August 11, 1946  . FOOT EXAM  10/21/1954  . OPHTHALMOLOGY EXAM  10/21/1954  . TETANUS/TDAP  10/22/1963  . PNA vac Low Risk Adult (1 of 2 - PCV13) 10/21/2009  . HEMOGLOBIN A1C  04/27/2020  . COLONOSCOPY  07/27/2024  . INFLUENZA VACCINE  Completed  . DEXA SCAN  Completed    Cancer Screenings: Lung: Low Dose CT Chest recommended if Age 20-80 years, 30 pack-year currently smoking OR have quit w/in 15years. Patient does not qualify. Breast:  Up to date on Mammogram? Yes   Up to date of Bone Density/Dexa? Yes Colorectal: no; patient will review endoscopy providers and notify us of her choice.  Additional  Screenings:  Hepatitis C Screening: due at next in office visit with labs  Plan:   Ms. Hausner was provided with information on advanced directives, diabetic foot exam, and endoscopy providers in area. She reports she's had pneumonia and shingles vaccines in the past when she lived in [place] but those records are not available at this time.    I have personally reviewed and noted the following in the patient's chart:   . Medical and social history . Use of alcohol, tobacco or illicit drugs  . Current medications and supplements . Functional ability and status . Nutritional status . Physical activity . Advanced directives . List of other physicians . Hospitalizations, surgeries, and ER visits in previous 12 months . Vitals . Screenings to include cognitive, depression, and falls . Referrals and appointments  In addition, I have reviewed and discussed with patient certain preventive protocols, quality metrics, and best practice recommendations. A written personalized care plan for preventive services as well as general preventive health recommendations were provided to patient.     Franne Forts, LPN  [id number]

## 2020-01-29 ENCOUNTER — Ambulatory Visit: Payer: Medicare Other

## 2020-02-05 ENCOUNTER — Ambulatory Visit: Payer: Medicare Other

## 2020-02-09 ENCOUNTER — Ambulatory Visit: Payer: Medicare Other

## 2020-02-11 ENCOUNTER — Other Ambulatory Visit: Payer: Self-pay | Admitting: Family Medicine

## 2020-02-13 ENCOUNTER — Telehealth (INDEPENDENT_AMBULATORY_CARE_PROVIDER_SITE_OTHER): Payer: Medicare Other | Admitting: Family Medicine

## 2020-02-13 ENCOUNTER — Telehealth: Payer: Self-pay | Admitting: Family Medicine

## 2020-02-13 DIAGNOSIS — Z7189 Other specified counseling: Secondary | ICD-10-CM | POA: Diagnosis not present

## 2020-02-13 DIAGNOSIS — J0100 Acute maxillary sinusitis, unspecified: Secondary | ICD-10-CM

## 2020-02-13 MED ORDER — AMOXICILLIN 500 MG PO TABS: 500.0000 mg | ORAL_TABLET | Freq: Two times a day (BID) | ORAL | 0 refills | Status: AC

## 2020-02-13 NOTE — Telephone Encounter (Signed)
Pt has a sinus infection and is requesting medication to be called in to the pharmacy. Pt has no power or Internet today and can't come in to the office. Pt uses [facility] representative on [address]. Thanks

## 2020-02-13 NOTE — Progress Notes (Signed)
Virtual Visit via Telephone Note  I connected with Tara Guerrero on 02/13/20 at 10:00 AM EST by telephone and verified that I am speaking with the correct person using two identifiers.   I discussed the limitations, risks, security and privacy concerns of performing an evaluation and management service by telephone and the availability of in person appointments. I also discussed with the patient that there may be a patient responsible charge related to this service. The patient expressed understanding and agreed to proceed.  Location patient: home Location provider: work or home office Participants present for the call: patient, provider Patient did not have a visit in the prior 7 days to address this/these issue(s).   History of Present Illness: Pt is a 74 yo female with pmh sig for HTN, DM II, HLD, h/o uterine cancer.  Pt developed a HA Friday.  Now having rhinorrhea, nasal drainage, pain in face, teeth, and ear.  Denies fever, sore throat, cough, n/v, diarrhea, loss of taste or smell.  Pt tried advil, coricidin HBP, alka seltzer, mucinex, excedrin migraine, nasal spray, hot and cold compresses.  Denies sick contacts.  Only goes to the grocery store.  Pt had first moderna COVID vaccine 3 wks ago.     Observations/Objective: Patient sounds cheerful and well on the phone. I do not appreciate any SOB. Speech and thought processing are grossly intact. Patient reported vitals:  Assessment and Plan: Acute non-recurrent maxillary sinusitis  -viral URI symptoms -discussed supportive care: tylenol, nasal rinse -given a wait and see rx for amoxicillin.  -self quarantine advised - Plan: amoxicillin (AMOXIL) 500 MG tablet  Educated about COVID-19 virus infection -discussed s/s of COVID infection -given info for community testing. -self quarantine advised   Follow Up Instructions: F/u prn for continued or worsened symptoms  I did not refer this patient for an OV in the next 24 hours for  this/these issue(s).  I discussed the assessment and treatment plan with the patient. The patient was provided an opportunity to ask questions and all were answered. The patient agreed with the plan and demonstrated an understanding of the instructions.   The patient was advised to call back or seek an in-person evaluation if the symptoms worsen or if the condition fails to improve as anticipated.  I provided 11 minutes of non-face-to-face time during this encounter.   Billie Ruddy, MD

## 2020-02-13 NOTE — Telephone Encounter (Signed)
Pt has been scheduled for a telephone virtual visit with Dr Volanda Napoleon this morning

## 2020-02-16 DIAGNOSIS — Z03818 Encounter for observation for suspected exposure to other biological agents ruled out: Secondary | ICD-10-CM | POA: Diagnosis not present

## 2020-02-16 DIAGNOSIS — I10 Essential (primary) hypertension: Secondary | ICD-10-CM | POA: Diagnosis not present

## 2020-02-19 ENCOUNTER — Ambulatory Visit: Payer: Medicare Other

## 2020-04-01 IMAGING — MG DIGITAL DIAGNOSTIC BILATERAL MAMMOGRAM WITH TOMO AND CAD
8 series · 8 of 24 positions shown · non-contrast
Comparison: Previous exam(s).

CLINICAL DATA: 72-year-old female with palpable abnormality in the
outer right breast. History of prior benign right breast excision.

EXAM:
DIGITAL DIAGNOSTIC BILATERAL MAMMOGRAM WITH CAD AND TOMO
RIGHT BREAST ULTRASOUND

[L CC synth-2D]
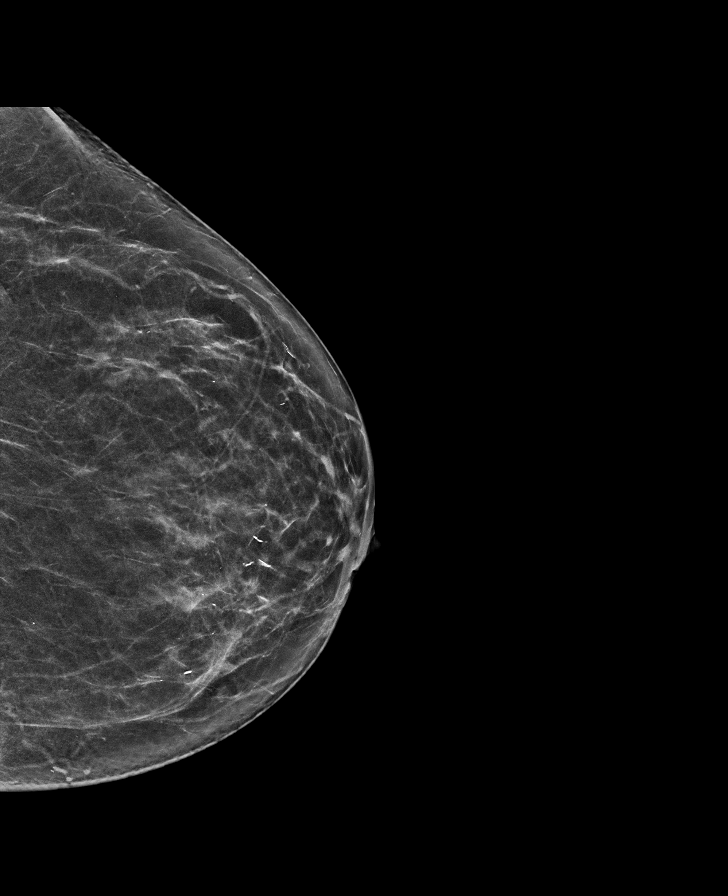

[R CC synth-2D]
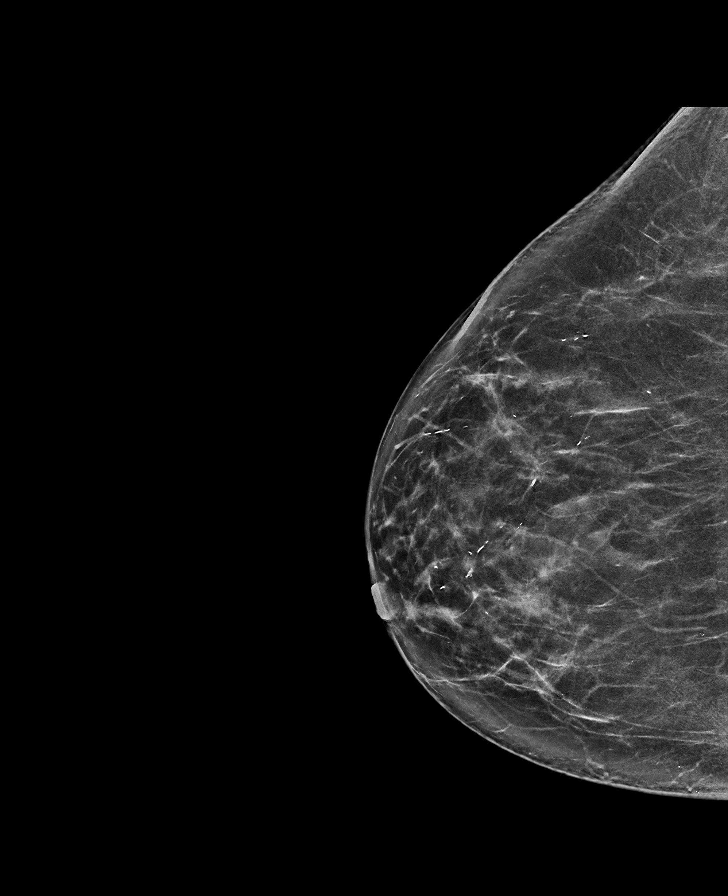

[R MLO synth-2D]
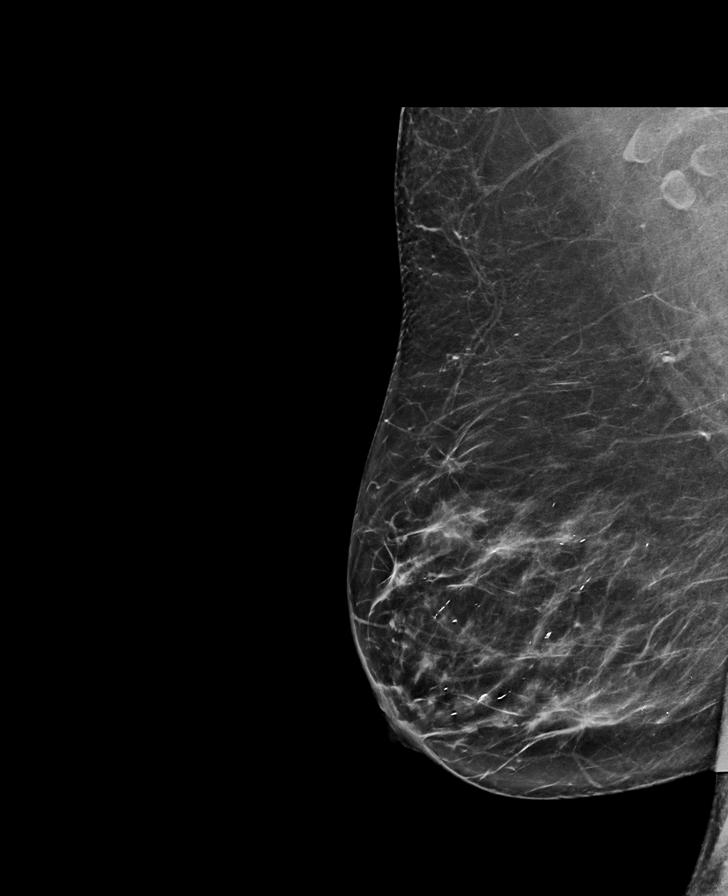

[L MLO synth-2D]
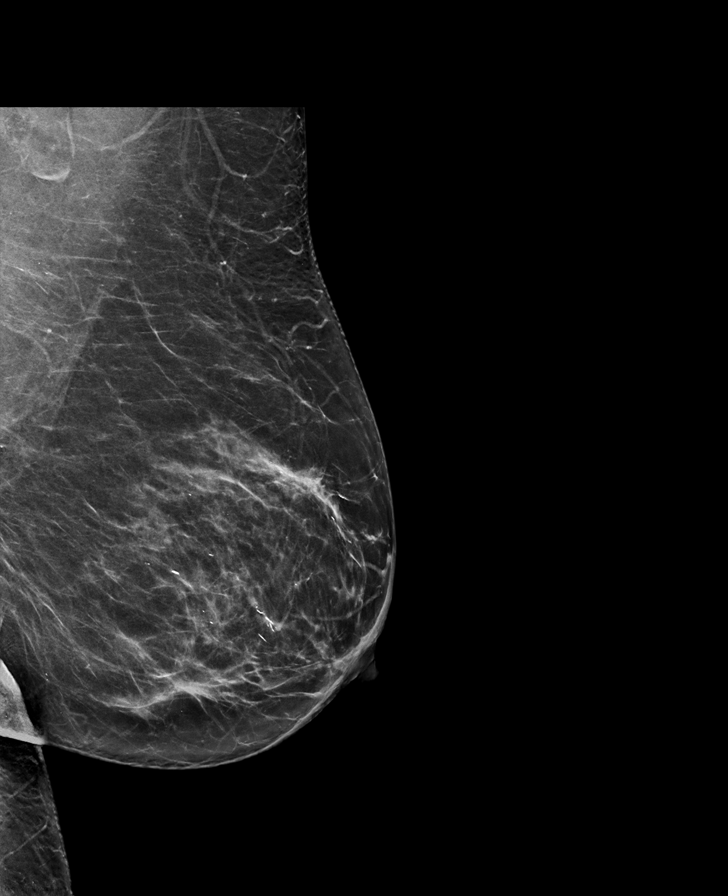

[R MLO tomo · tomo slice 38/75.0]
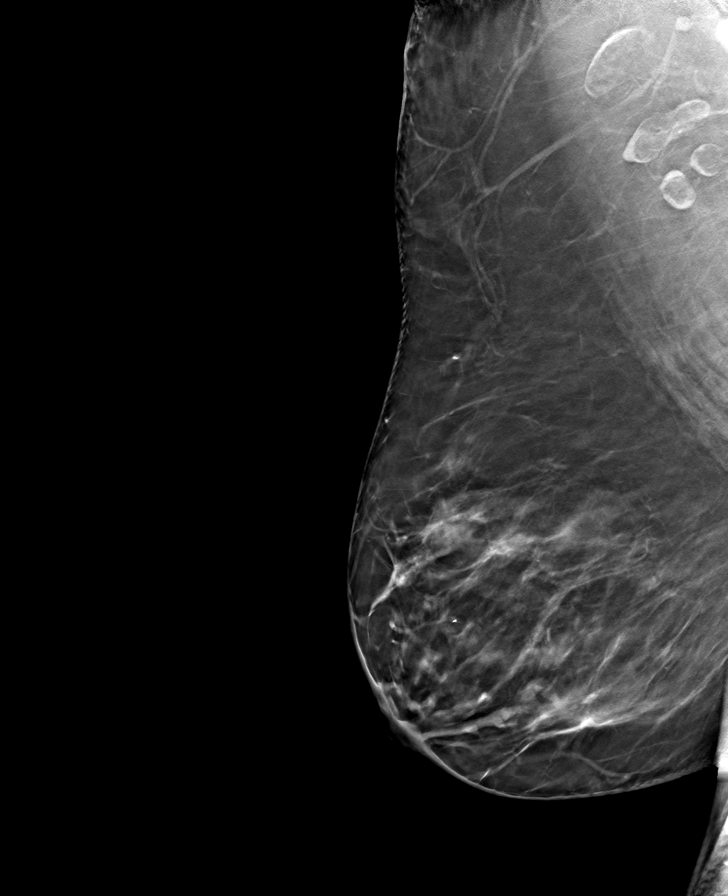

[L CC tomo · tomo slice 35/68.0]
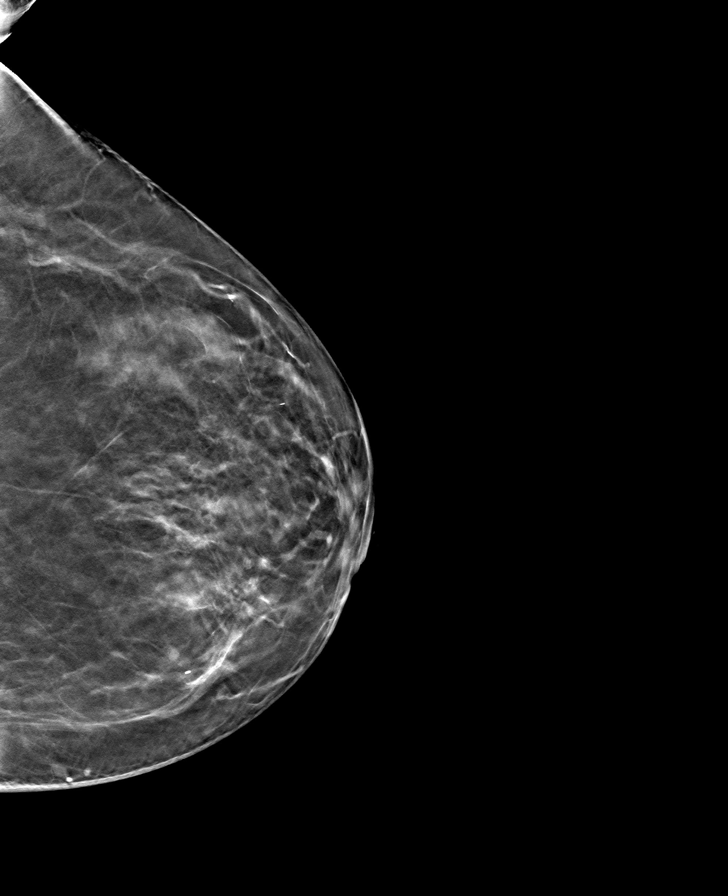

[R CC tomo · tomo slice 37/74.0]
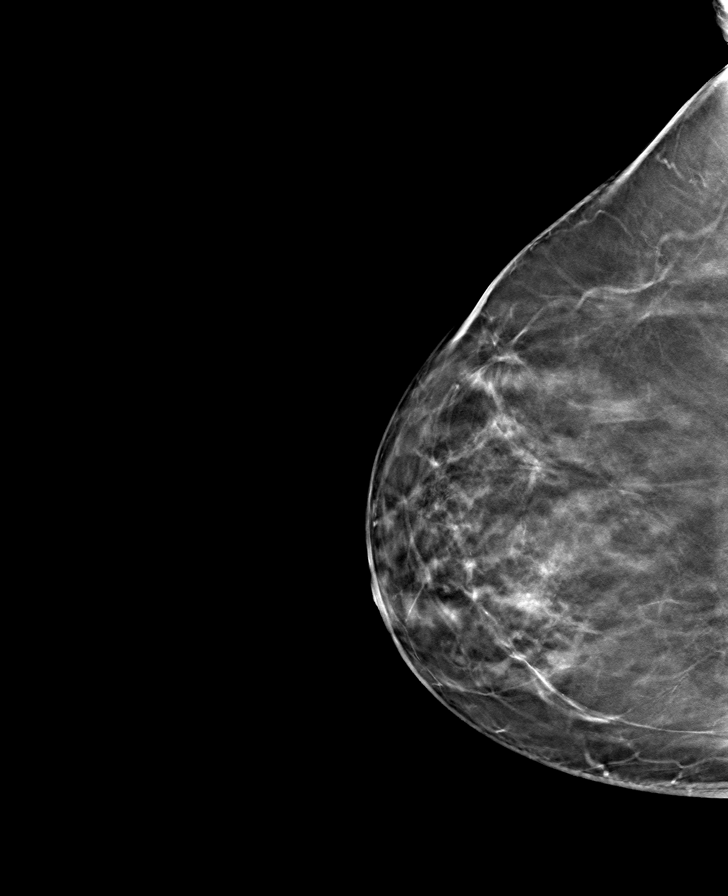

[L MLO tomo · tomo slice 38/75.0]
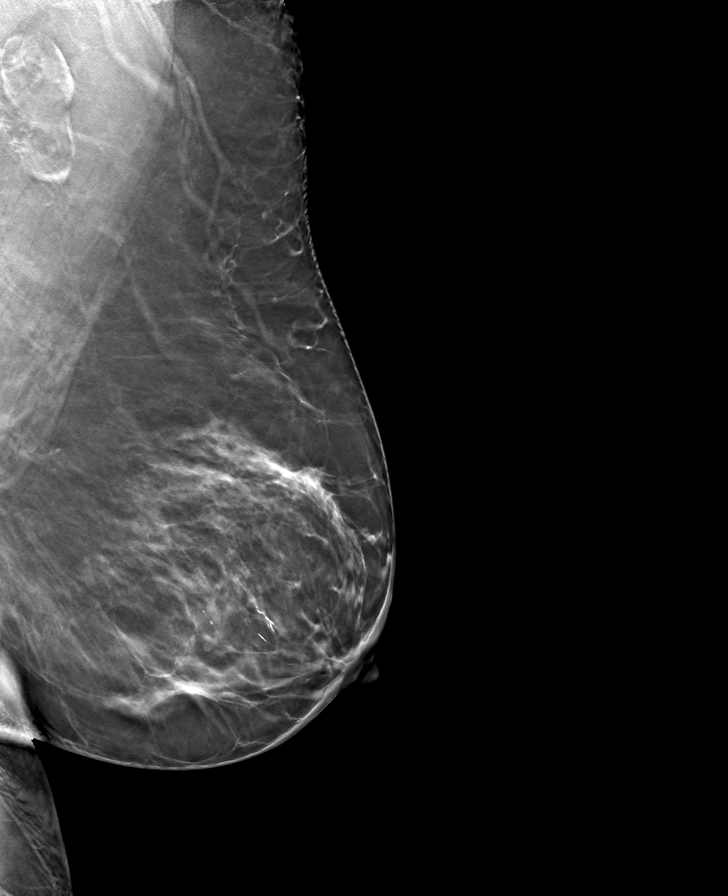

[8 of 24 positions shown; findings below may reference images not displayed]

ACR Breast Density Category b: There are scattered areas of
fibroglandular density.
FINDINGS: No suspicious masses or abnormality seen in either breast. There is
no mammographic evidence of malignancy in either breast.

Mammographic images were processed with CAD.

The patient states this palpable at area of concern was felt by her
doctor. She points to the far outer right breast at the approximate
9 o'clock position. Physical examination of the outer right breast
does not reveal any palpable masses.

Targeted ultrasound of the right breast was performed. No suspicious
masses or abnormality seen, only heterogeneous fibroglandular tissue
identified.
IMPRESSION: 1. No abnormalities at site of palpable concern in the outer right
breast.

2.  No mammographic evidence of malignancy in either breast.

RECOMMENDATION:
1. Recommend further management of right breast palpable area of
concern be based on clinical assessment.

2. Screening mammogram in one year.(Code:UG-D-26V) The American
Cancer Society recommends annual MRI and mammography in patients
with an estimated lifetime risk of developing breast cancer greater
than 20 - 25%, or who are known or suspected to be positive for the
breast cancer gene.

I have discussed the findings and recommendations with the patient.
Results were also provided in writing at the conclusion of the
visit. If applicable, a reminder letter will be sent to the patient
regarding the next appointment.

BI-RADS CATEGORY  1: Negative.

## 2020-04-25 ENCOUNTER — Other Ambulatory Visit: Payer: Self-pay | Admitting: Family Medicine

## 2020-04-25 ENCOUNTER — Other Ambulatory Visit: Payer: Self-pay

## 2020-04-25 ENCOUNTER — Telehealth: Payer: Self-pay | Admitting: Family Medicine

## 2020-04-25 MED ORDER — BUPROPION HCL ER (SR) 100 MG PO TB12: 100.0000 mg | ORAL_TABLET | Freq: Every day | ORAL | 0 refills | Status: DC

## 2020-04-25 MED ORDER — HYDROCHLOROTHIAZIDE 25 MG PO TABS: 25.0000 mg | ORAL_TABLET | Freq: Every day | ORAL | 1 refills | Status: DC

## 2020-04-25 MED ORDER — SYNJARDY 12.5-1000 MG PO TABS: 1000.0000 mg | ORAL_TABLET | Freq: Two times a day (BID) | ORAL | 5 refills | Status: DC

## 2020-04-25 NOTE — Telephone Encounter (Signed)
Pt Rx sent to pt pharmacy as requested, spoke with pt state that she does not need Restasis  eyedrops

## 2020-04-25 NOTE — Telephone Encounter (Signed)
The patient needs this Rx refilled and sent to [facility]  atorvastatin (LIPITOR) 20 MG tablet  [place] [place] [employer] Phone:  [phone]  Fax:  [phone]     The patient is needing a new script sent to [facility] but not refilled for:  buPROPion (WELLBUTRIN SR) 100 MG 12 hr tablet  hydrochlorothiazide (HYDRODIURIL) 25 MG tablet  Empagliflozin-metFORMIN HCl (SYNJARDY) 12.04-999 MG TABS  The patient was also wanting a Rx for Restasis, I did not see this Rx in her history or medication list.

## 2020-06-21 DIAGNOSIS — D224 Melanocytic nevi of scalp and neck: Secondary | ICD-10-CM | POA: Diagnosis not present

## 2020-06-21 DIAGNOSIS — L821 Other seborrheic keratosis: Secondary | ICD-10-CM | POA: Diagnosis not present

## 2020-06-21 DIAGNOSIS — L814 Other melanin hyperpigmentation: Secondary | ICD-10-CM | POA: Diagnosis not present

## 2020-06-21 DIAGNOSIS — L738 Other specified follicular disorders: Secondary | ICD-10-CM | POA: Diagnosis not present

## 2020-06-21 DIAGNOSIS — L570 Actinic keratosis: Secondary | ICD-10-CM | POA: Diagnosis not present

## 2020-06-28 ENCOUNTER — Other Ambulatory Visit: Payer: Self-pay

## 2020-06-29 ENCOUNTER — Ambulatory Visit (INDEPENDENT_AMBULATORY_CARE_PROVIDER_SITE_OTHER): Payer: Medicare Other | Admitting: Family Medicine

## 2020-06-29 ENCOUNTER — Encounter: Payer: Self-pay | Admitting: Family Medicine

## 2020-06-29 VITALS — BP 102/68 | HR 68 | Temp 96.9°F | Wt 190.0 lb

## 2020-06-29 DIAGNOSIS — S00412A Abrasion of left ear, initial encounter: Secondary | ICD-10-CM | POA: Diagnosis not present

## 2020-06-29 DIAGNOSIS — Z86010 Personal history of colonic polyps: Secondary | ICD-10-CM | POA: Diagnosis not present

## 2020-06-29 DIAGNOSIS — M25441 Effusion, right hand: Secondary | ICD-10-CM

## 2020-06-29 DIAGNOSIS — Z1211 Encounter for screening for malignant neoplasm of colon: Secondary | ICD-10-CM | POA: Diagnosis not present

## 2020-06-29 DIAGNOSIS — M25442 Effusion, left hand: Secondary | ICD-10-CM

## 2020-06-29 DIAGNOSIS — M65342 Trigger finger, left ring finger: Secondary | ICD-10-CM | POA: Diagnosis not present

## 2020-06-29 DIAGNOSIS — E119 Type 2 diabetes mellitus without complications: Secondary | ICD-10-CM

## 2020-06-29 LAB — POCT GLYCOSYLATED HEMOGLOBIN (HGB A1C): Hemoglobin A1C: 7.8 % — AB (ref 4.0–5.6)

## 2020-06-29 NOTE — Patient Instructions (Signed)
Trigger Finger  Trigger finger, also called stenosing tenosynovitis,  is a condition that causes a finger to get stuck in a bent position. Each finger has a tendon, which is a tough, cord-like tissue that connects muscle to bone, and each tendon passes through a tunnel of tissue called a tendon sheath. To move your finger, your tendon needs to glide freely through the sheath. Trigger finger happens when the tendon or the sheath thickens, making it difficult to move your finger. Trigger finger can affect any finger or a thumb. It may affect more than one finger. Mild cases may clear up with rest and medicine. Severe cases require more treatment. What are the causes? Trigger finger is caused by a thickened finger tendon or tendon sheath. The cause of this thickening is not known. What increases the risk? The following factors may make you more likely to develop this condition:  Doing activities that require a strong grip.  Having rheumatoid arthritis, gout, or diabetes.  Being 90-71 years old.  Being female. What are the signs or symptoms? Symptoms of this condition include:  Pain when bending or straightening your finger.  Tenderness or swelling where your finger attaches to the palm of your hand.  A lump in the palm of your hand or on the inside of your finger.  Hearing a noise like a pop or a snap when you try to straighten your finger.  Feeling a catching or locking sensation when you try to straighten your finger.  Being unable to straighten your finger. How is this diagnosed? This condition is diagnosed based on your symptoms and a physical exam. How is this treated? This condition may be treated by:  Resting your finger and avoiding activities that make symptoms worse.  Wearing a finger splint to keep your finger extended.  Taking NSAIDs, such as ibuprofen, to relieve pain and swelling.  Doing gentle exercises to stretch the finger as told by your health care  provider.  Having medicine that reduces swelling and inflammation (steroids) injected into the tendon sheath. Injections may need to be repeated.  Having surgery to open the tendon sheath. This may be done if other treatments do not work and you cannot straighten your finger. You may need physical therapy after surgery. Follow these instructions at home: If you have a splint:  Wear the splint as told by your health care provider. Remove it only as told by your health care provider.  Loosen it if your fingers tingle, become numb, or turn cold and blue.  Keep it clean.  If the splint is not waterproof: ? Do not let it get wet. ? Cover it with a watertight covering when you take a bath or shower. Managing pain, stiffness, and swelling     If directed, apply heat to the affected area as often as told by your health care provider. Use the heat source that your health care provider recommends, such as a moist heat pack or a heating pad.  Place a towel between your skin and the heat source.  Leave the heat on for 20-30 minutes.  Remove the heat if your skin turns bright red. This is especially important if you are unable to feel pain, heat, or cold. You may have a greater risk of getting burned. If directed, put ice on the painful area. To do this:  If you have a removable splint, remove it as told by your health care provider.  Put ice in a plastic bag.  Place a  towel between your skin and the bag or between your splint and the bag.  Leave the ice on for 20 minutes, 2-3 times a day.  Activity  Rest your finger as told by your health care provider. Avoid activities that make the pain worse.  Return to your normal activities as told by your health care provider. Ask your health care provider what activities are safe for you.  Do exercises as told by your health care provider.  Ask your health care provider when it is safe to drive if you have a splint on your hand. General  instructions  Take over-the-counter and prescription medicines only as told by your health care provider.  Keep all follow-up visits as told by your health care provider. This is important. Contact a health care provider if:  Your symptoms are not improving with home care. Summary  Trigger finger, also called stenosing tenosynovitis, causes your finger to get stuck in a bent position. This can make it difficult and painful to straighten your finger.  This condition develops when a finger tendon or tendon sheath thickens.  Treatment may include resting your finger, wearing a splint, and taking medicines.  In severe cases, surgery to open the tendon sheath may be needed. This information is not intended to replace advice given to you by your health care provider. Make sure you discuss any questions you have with your health care provider. Document Revised: 05/02/2019 Document Reviewed: 05/02/2019 Elsevier Patient Education  [place] is a term that is commonly used to refer to joint pain or joint disease. There are more than 100 types of arthritis. What are the causes? The most common cause of this condition is wear and tear of a joint. Other causes include:  Gout.  Inflammation of a joint.  An infection of a joint.  Sprains and other injuries near the joint.  A reaction to medicines or drugs, or an allergic reaction. In some cases, the cause may not be known. What are the signs or symptoms? The main symptom of this condition is pain in the joint during movement. Other symptoms include:  Redness, swelling, or stiffness at a joint.  Warmth coming from the joint.  Fever.  Overall feeling of illness. How is this diagnosed? This condition may be diagnosed with a physical exam and tests, including:  Blood tests.  Urine tests.  Imaging tests, such as X-rays, an MRI, or a CT scan. Sometimes, fluid is removed from a joint for testing. How is  this treated? This condition may be treated with:  Treatment of the cause, if it is known.  Rest.  Raising (elevating) the joint.  Applying cold or hot packs to the joint.  Medicines to improve symptoms and reduce inflammation.  Injections of a steroid such as cortisone into the joint to help reduce pain and inflammation. Depending on the cause of your arthritis, you may need to make lifestyle changes to reduce stress on your joint. Changes may include:  Exercising more.  Losing weight. Follow these instructions at home: Medicines  Take over-the-counter and prescription medicines only as told by your health care provider.  Do not take aspirin to relieve pain if your health care provider thinks that gout may be causing your pain. Activity  Rest your joint if told by your health care provider. Rest is important when your disease is active and your joint feels painful, swollen, or stiff.  Avoid activities that make the pain worse. It is important  to balance activity with rest.  Exercise your joint regularly with range-of-motion exercises as told by your health care provider. Try doing low-impact exercise, such as: ? Swimming. ? Water aerobics. ? Biking. ? Walking. Managing pain, stiffness, and swelling      If directed, put ice on the joint. ? Put ice in a plastic bag. ? Place a towel between your skin and the bag. ? Leave the ice on for 20 minutes, 2-3 times per day.  If your joint is swollen, raise (elevate) it above the level of your heart if directed by your health care provider.  If your joint feels stiff in the morning, try taking a warm shower.  If directed, apply heat to the affected area as often as told by your health care provider. Use the heat source that your health care provider recommends, such as a moist heat pack or a heating pad. If you have diabetes, do not apply heat without permission from your health care provider. To apply heat: ? Place a towel  between your skin and the heat source. ? Leave the heat on for 20-30 minutes. ? Remove the heat if your skin turns bright red. This is especially important if you are unable to feel pain, heat, or cold. You may have a greater risk of getting burned. General instructions  Do not use any products that contain nicotine or tobacco, such as cigarettes, e-cigarettes, and chewing tobacco. If you need help quitting, ask your health care provider.  Keep all follow-up visits as told by your health care provider. This is important. Contact a health care provider if:  The pain gets worse.  You have a fever. Get help right away if:  You develop severe joint pain, swelling, or redness.  Many joints become painful and swollen.  You develop severe back pain.  You develop severe weakness in your leg.  You cannot control your bladder or bowels. Summary  Arthritis is a term that is commonly used to refer to joint pain or joint disease. There are more than 100 types of arthritis.  The most common cause of this condition is wear and tear of a joint. Other causes include gout, inflammation or infection of the joint, sprains, or allergies.  Symptoms of this condition include redness, swelling, or stiffness of the joint. Other symptoms include warmth, fever, or feeling ill.  This condition is treated with rest, elevation, medicines, and applying cold or hot packs.  Follow your health care provider's instructions about medicines, activity, exercises, and other home care treatments. This information is not intended to replace advice given to you by your health care provider. Make sure you discuss any questions you have with your health care provider. Document Revised: 11/22/2018 Document Reviewed: 11/22/2018 Elsevier Patient Education  Arp.  Abrasion  An abrasion is a cut or a scrape on the surface of your skin. An abrasion does not go through all the layers of your skin. It is  important to take good care of your abrasion to prevent infection. Follow these instructions at home: Medicines  Take or apply over-the-counter and prescription medicines only as told by your doctor.  If you were prescribed an antibiotic medicine, apply it as told by your doctor. Do not stop using the antibiotic even if you start to feel better. Wound care  Clean the wound 2-3 times a day or as often as told by your doctor. To do this: 1. Wash the wound with mild soap and water. 2.  Rinse off the soap. 3. Pat a clean towel on the wound to dry it. Do not rub it.  Keep the bandage (dressing) clean and dry as told by your doctor.  Follow instructions from your doctor about how to take care of your wound. Make sure you: ? Wash your hands with soap and water before you change your bandage. If you cannot use soap and water, use hand sanitizer. ? Change your bandage as told by your doctor.  Check your wound every day for signs of infection. Check for: ? Redness, swelling, or pain. ? Fluid or blood. ? Warmth. ? Pus or a bad smell.  If directed, put ice on the injured area. To do this: ? Put ice in a plastic bag. ? Place a towel between your skin and the bag. ? Leave the ice on for 20 minutes, 2-3 times a day. General instructions  Do not take baths, swim, or use a hot tub until your doctor says it is okay.  If there is swelling, raise (elevate) the injured area above the level of your heart while you are sitting or lying down.  Keep all follow-up visits as told by your doctor. This is important. Contact a doctor if:  You were given a tetanus shot, and you have any of these where the needle went in: ? Swelling. ? Very bad pain. ? Redness. ? Bleeding.  You have a lot of pain, and medicine does not help.  You have any of these at the site of the wound: ? More redness. ? More swelling. ? More pain. Get help right away if:  You have a red streak going away from your  wound.  You have a fever.  You have fluid, blood, or pus coming from your wound.  There is a bad smell coming from your wound or bandage. Summary  An abrasion is a cut or a scrape on the surface of your skin.  Take good care of your abrasion so it does not get infected.  Clean the wound with mild soap and water, and change your bandage as told by your doctor.  Call your doctor if you have redness, swelling, or more pain in your wound.  Get help right away if you have a fever or if you have fluid, blood, pus, a bad smell, or a red streak coming from the wound. This information is not intended to replace advice given to you by your health care provider. Make sure you discuss any questions you have with your health care provider. Document Revised: 11/27/2017 Document Reviewed: 08/06/2017 Elsevier Patient Education  2020 [employer].

## 2020-06-29 NOTE — Progress Notes (Signed)
Subjective:    Patient ID: Tara Guerrero, female    DOB: 03-13-1946, 74 y.o.   MRN: [id number]  No chief complaint on file.   HPI Patient was seen today for acute concern.  Patient endorses blood coming from left ear.  Patient endorses cleaning her ear using a Q-tip when she noticed a blood.  Bleeding has since stopped but patient wanted to have it checked.  Denies pain, purulent drainage, increased pressure in her ear.  Patient also inquires about seeing a specialist for arthritis.  Patient endorses swelling in bilateral joints, mild deformity/lateral bend in left index finger and brittle cracking nails.  Patient notes her maternal grandmother had a history of arthritis.  Patient is not having to take anything for discomfort.  Patient also notes left fourth digit becomes stuck in flexion.  Patient able to straighten finger using other hand.  Patient notes blood sugar has been stable.  Patient also inquires if she should have repeat colonoscopy.  Patient has had colonoscopy 5 years ago where a small polyp was removed.  Pt was to have colonoscopy last year however it was postponed 2/2 Covid. Past Medical History:  Diagnosis Date  . 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 Depression   . Diabetes mellitus without complication ([place])   . Hyperlipidemia   . Hypertension     Allergies  Allergen Reactions  . Codeine Other (See Comments)    Other Reaction: Other reaction  . Seasonal Ic [Cholestatin]     Seasonal allergies    ROS General: Denies fever, chills, night sweats, changes in weight, changes in appetite HEENT: Denies headaches, ear pain, changes in vision, rhinorrhea, sore throat  +bleeding from L ear CV: Denies CP, palpitations, SOB, orthopnea Pulm: Denies SOB, cough, wheezing GI: Denies abdominal pain, nausea, vomiting, diarrhea, constipation GU: Denies dysuria, hematuria, frequency, vaginal discharge Msk: Denies muscle cramps, joint pains  +joint edema and mild deformity, trigger  finger. Neuro: Denies weakness, numbness, tingling Skin: Denies rashes, bruising Psych: Denies depression, anxiety, hallucinations      Objective:    Blood pressure 102/68, pulse 68, temperature (!) 96.9 F (36.1 C), temperature source Temporal, weight 190 lb (86.2 kg), SpO2 97 %.   Gen. Pleasant, well-nourished, in no distress, normal affect   HEENT: [place]/AT, face symmetric, conjunctiva clear, no scleral icterus, PERRLA, EOMI, nares patent without drainage, R canal and TM normal.  L canal with small abrasion with dried blood present.  TM with cerumen against membrane, otherwise clear.   Lungs: no accessory muscle use Cardiovascular: RRR, no peripheral edema Musculoskeletal: No deformities, no cyanosis or clubbing, normal tone.  Trigger finger of L 4 th digit. Neuro:  A&Ox3, CN II-XII intact, normal gait Skin:  Warm, no lesions/ rash.  Fingernails of b/l hands with splitting.   Wt Readings from Last 3 Encounters:  06/29/20 190 lb (86.2 kg)  01/26/20 180 lb (81.6 kg)  06/08/19 187 lb (84.8 kg)    Lab Results  Component Value Date   WBC 8.8 12/14/2018   HGB 14.4 12/14/2018   HCT 43.5 12/14/2018   PLT 312.0 12/14/2018   GLUCOSE 132 (H) 10/28/2019   ALT 26 10/28/2019   AST 20 10/28/2019   NA 139 10/28/2019   K 4.1 10/28/2019   CL 98 10/28/2019   CREATININE 0.85 10/28/2019   BUN 21 10/28/2019   CO2 30 10/28/2019   TSH 2.52 12/14/2018   HGBA1C 7.9 (H) 10/28/2019   MICROALBUR <0.7 10/28/2019    Assessment/Plan:  Abrasion of ear canal, left, initial encounter -healing -discussed s/s of infection -avoid digging/scratching in ear  Trigger ring finger of left hand -Discussed treatment options.  Patient wishes to wait -Given handout  Colon cancer screening  - Plan: Ambulatory referral to Gastroenterology  History of colon polyps  -Last Colooscopy 5 years ago with removal of one small polyp due for repeat last year, 2020. - Plan: Ambulatory referral to  Gastroenterology  Swelling of finger joint of left hand -Discussed possible causes including arthritis. -Discussed different types of arthritis -We will obtain x-ray to evaluate for RA vs OA -We will obtain labs if needed  Swelling of finger joint of right hand  -As above -Supportive care as needed including Tylenol arthritis strength - Plan: DG Hand Complete Right  Controlled type 2 diabetes mellitus without complication, without long-term current use of insulin (HCC) -Continue current meds Synjardy 12.04-999 mg twice daily -Continue lifestyle modifications -Continue checking FSBS regularly at home -Plan: hemoglobin A1c 7.8% this visit  F/u prn in the next month, sooner if needed  Grier Mitts, MD

## 2020-07-01 ENCOUNTER — Other Ambulatory Visit: Payer: Self-pay | Admitting: Family Medicine

## 2020-07-05 ENCOUNTER — Other Ambulatory Visit: Payer: Self-pay | Admitting: Family Medicine

## 2020-07-05 DIAGNOSIS — Z1231 Encounter for screening mammogram for malignant neoplasm of breast: Secondary | ICD-10-CM

## 2020-07-10 ENCOUNTER — Ambulatory Visit
Admission: RE | Admit: 2020-07-10 | Discharge: 2020-07-10 | Disposition: A | Discharge status: Home / Self Care | Payer: Medicare Other | Source: Ambulatory Visit | Attending: Family Medicine | Admitting: Family Medicine

## 2020-07-10 ENCOUNTER — Other Ambulatory Visit: Payer: Self-pay

## 2020-07-10 DIAGNOSIS — Z1231 Encounter for screening mammogram for malignant neoplasm of breast: Secondary | ICD-10-CM | POA: Diagnosis not present

## 2020-08-15 DIAGNOSIS — H04123 Dry eye syndrome of bilateral lacrimal glands: Secondary | ICD-10-CM | POA: Diagnosis not present

## 2020-08-15 DIAGNOSIS — H26491 Other secondary cataract, right eye: Secondary | ICD-10-CM | POA: Diagnosis not present

## 2020-08-15 DIAGNOSIS — E119 Type 2 diabetes mellitus without complications: Secondary | ICD-10-CM | POA: Diagnosis not present

## 2020-08-22 ENCOUNTER — Other Ambulatory Visit: Payer: Self-pay | Admitting: Family Medicine

## 2020-08-30 ENCOUNTER — Other Ambulatory Visit: Payer: Self-pay | Admitting: Family Medicine

## 2020-08-30 ENCOUNTER — Telehealth: Payer: Self-pay | Admitting: Family Medicine

## 2020-08-30 NOTE — Telephone Encounter (Signed)
Rx already sent to pt pharmacy 

## 2020-08-30 NOTE — Telephone Encounter (Signed)
Pt is calling in needing her Rx atorvastatin (LIPITOR) 20 MG refilled due to her being out.  Pharm:  [facility]

## 2020-09-24 ENCOUNTER — Other Ambulatory Visit: Payer: Self-pay | Admitting: Family Medicine

## 2020-11-26 ENCOUNTER — Other Ambulatory Visit: Payer: Self-pay | Admitting: Family Medicine

## 2020-11-26 ENCOUNTER — Other Ambulatory Visit: Payer: Self-pay

## 2020-11-26 ENCOUNTER — Ambulatory Visit (INDEPENDENT_AMBULATORY_CARE_PROVIDER_SITE_OTHER)
Admission: RE | Admit: 2020-11-26 | Discharge: 2020-11-26 | Disposition: A | Discharge status: Home / Self Care | Payer: Medicare Other | Source: Ambulatory Visit | Attending: Family Medicine | Admitting: Family Medicine

## 2020-11-26 DIAGNOSIS — M25441 Effusion, right hand: Secondary | ICD-10-CM | POA: Diagnosis not present

## 2020-11-26 DIAGNOSIS — M19042 Primary osteoarthritis, left hand: Secondary | ICD-10-CM | POA: Diagnosis not present

## 2020-11-28 ENCOUNTER — Other Ambulatory Visit: Payer: Self-pay

## 2020-11-28 DIAGNOSIS — M154 Erosive (osteo)arthritis: Secondary | ICD-10-CM

## 2020-12-03 ENCOUNTER — Encounter: Payer: Self-pay | Admitting: Family Medicine

## 2020-12-03 ENCOUNTER — Telehealth (INDEPENDENT_AMBULATORY_CARE_PROVIDER_SITE_OTHER): Payer: Medicare Other | Admitting: Family Medicine

## 2020-12-03 VITALS — Temp 97.7°F

## 2020-12-03 DIAGNOSIS — J019 Acute sinusitis, unspecified: Secondary | ICD-10-CM | POA: Diagnosis not present

## 2020-12-03 MED ORDER — AZITHROMYCIN 250 MG PO TABS: ORAL_TABLET | ORAL | 0 refills | Status: DC

## 2020-12-03 NOTE — Progress Notes (Signed)
Subjective:    Patient ID: Tara Guerrero, female    DOB: 05-25-1946, 74 y.o.   MRN: [id number]  HPI Virtual Visit via Video Note  I connected with the patient on 12/03/20 at  3:15 PM EST by a video enabled telemedicine application and verified that I am speaking with the correct person using two identifiers.  Location patient: home Location provider:work or home office Persons participating in the virtual visit: patient, provider  I discussed the limitations of evaluation and management by telemedicine and the availability of in person appointments. The patient expressed understanding and agreed to proceed.   HPI: Here for 6 days of sinus pressure, headache, PND, and a dry cough. No body aches or fever. No NVD. She tested negative for Covid-19 at home 3 days ago. Drinking fluids and using Robitussin.    ROS: See pertinent positives and negatives per HPI.  Past Medical History:  Diagnosis Date  . 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 Depression   . Diabetes mellitus without complication ([place])   . Hyperlipidemia   . Hypertension     Past Surgical History:  Procedure Laterality Date  . ABDOMINAL HYSTERECTOMY  1976   s/p uterine CA  . BREAST EXCISIONAL BIOPSY Bilateral   . BREAST SURGERY  1995   L breast node removal  . BREAST SURGERY     R breast biopsy  . EYE MUSCLE SURGERY  2019  . EYE SURGERY  2017   bilateral cataract surgery  . JOINT REPLACEMENT  2004   bilateral knee replacements  . TONSILLECTOMY  1950    Family History  Problem Relation Age of Onset  . Cancer Mother   . Hyperlipidemia Mother   . Hypertension Mother   . Stroke Mother   . Breast cancer Mother        diagnosed in her 24's  . Alcohol abuse Father   . Cancer Sister   . Hyperlipidemia Sister   . Hypertension Sister   . Stroke Sister   . Breast cancer Sister        diagnosed in her 32's  . Mental illness Maternal Grandmother   . Hypertension Maternal Grandmother   . Hyperlipidemia Maternal  Grandmother   . Mental retardation Maternal Grandmother   . Hearing loss Maternal Grandfather   . Stroke Maternal Grandfather   . Alcohol abuse Paternal Grandmother   . Alcohol abuse Paternal Grandfather   . Breast cancer Cousin        diagnosed in her 51's  . Breast cancer Sister 59  . Breast cancer Other        diagnosed late 20's     Current Outpatient Medications:  .  APPLE CIDER VINEGAR PO, Take 2 tablets by mouth 2 (two) times daily., Disp: , Rfl:  .  Ascorbic Acid (VITAMIN C) 1000 MG tablet, Take 1,000 mg by mouth daily., Disp: , Rfl:  .  aspirin EC 81 MG tablet, Take 81 mg by mouth daily., Disp: , Rfl:  .  atorvastatin (LIPITOR) 20 MG tablet, TAKE 1 TABLET BY MOUTH  DAILY, Disp: 90 tablet, Rfl: 1 .  azithromycin (ZITHROMAX Z-PAK) 250 MG tablet, As directed, Disp: 6 each, Rfl: 0 .  Blood Glucose Monitoring Suppl (ONE TOUCH ULTRA 2) w/Device KIT, Use to Check Blood glucose daily.( Dx:Z51.81), Disp: 1 each, Rfl: 0 .  buPROPion (WELLBUTRIN SR) 100 MG 12 hr tablet, TAKE 1 TABLET BY MOUTH  DAILY, Disp: 90 tablet, Rfl: 0 .  Calcium Carb-Cholecalciferol (  CALCIUM 500/D) 500-400 MG-UNIT CHEW, Chew by mouth daily., Disp: , Rfl:  .  cetirizine (ZYRTEC) 10 MG chewable tablet, Chew 10 mg by mouth daily., Disp: , Rfl:  .  Cholecalciferol (VITAMIN D) 50 MCG (2000 UT) tablet, Take 2,000 Units by mouth daily., Disp: , Rfl:  .  Empagliflozin-metFORMIN HCl (SYNJARDY) 12.04-999 MG TABS, Take 1,000 mg by mouth 2 (two) times daily., Disp: 180 tablet, Rfl: 5 .  ferrous sulfate 324 (65 Fe) MG TBEC, Take by mouth daily., Disp: , Rfl:  .  guaiFENesin (MUCINEX) 600 MG 12 hr tablet, Take 600 mg by mouth 2 (two) times daily., Disp: , Rfl:  .  hydrochlorothiazide (HYDRODIURIL) 25 MG tablet, Take 1 tablet (25 mg total) by mouth daily. Take 1/2 tablet by mouth daily, Disp: 90 tablet, Rfl: 1 .  Lactobacillus (PROBIOTIC ACIDOPHILUS PO), Take by mouth daily., Disp: , Rfl:  .  Multiple Vitamins-Minerals (HAIR SKIN  AND NAILS FORMULA PO), Take by mouth daily., Disp: , Rfl:  .  ramipril (ALTACE) 10 MG capsule, TAKE 1 CAPSULE BY MOUTH  DAILY, Disp: 90 capsule, Rfl: 1 .  verapamil (CALAN-SR) 180 MG CR tablet, TAKE 1 TABLET BY MOUTH AT  BEDTIME, Disp: 90 tablet, Rfl: 3  EXAM:  VITALS per patient if applicable:  GENERAL: alert, oriented, appears well and in no acute distress  HEENT: atraumatic, conjunttiva clear, no obvious abnormalities on inspection of external nose and ears  NECK: normal movements of the head and neck  LUNGS: on inspection no signs of respiratory distress, breathing rate appears normal, no obvious gross SOB, gasping or wheezing  CV: no obvious cyanosis  MS: moves all visible extremities without noticeable abnormality  PSYCH/NEURO: pleasant and cooperative, no obvious depression or anxiety, speech and thought processing grossly intact  ASSESSMENT AND PLAN: Sinusitis, treat with a Zpack.  Alysia Penna, MD Discussed the following assessment and plan:  No diagnosis found.     I discussed the assessment and treatment plan with the patient. The patient was provided an opportunity to ask questions and all were answered. The patient agreed with the plan and demonstrated an understanding of the instructions.   The patient was advised to call back or seek an in-person evaluation if the symptoms worsen or if the condition fails to improve as anticipated.     Review of Systems     Objective:   Physical Exam        Assessment & Plan:

## 2020-12-05 ENCOUNTER — Telehealth: Payer: Self-pay | Admitting: Internal Medicine

## 2020-12-05 NOTE — Telephone Encounter (Signed)
Hi Dr. Hilarie Fredrickson,  We have received a referral from patient's PCP for a repeat colon. According to PM DOD 11/26/20; you are the listed provider. Patient had colon in November 29, 2014. Medical records indicate high risk colon cancer surveillance and personal history of colonic polyps.  Records have been received and they will be sent for your review.. Please advise on scheduling..   Thank you, Have a fantastic day!!

## 2020-12-06 NOTE — Telephone Encounter (Signed)
The patient had a colonoscopy at [facility] on 12/19/2014.  This was a complete colonoscopy with adequate bowel preparation.  There was a sessile polyp 4 mm in size removed from the sigmoid colon.  This was not adenomatous.  There was diverticulosis from the ascending colon to the sigmoid.  Based on current guidelines repeat screening or surveillance colonoscopy would be 10 years later which would be December 2025.  Screening and surveillance colonoscopy typically stops around age 58 and the patient will be 74 years old at the time of next screening or surveillance.  Thus she may not benefit from a repeat screening or surveillance colonoscopy.  If she has questions about this recommendation which is following the current national guidelines, I am happy to see her in clinic to discuss further.  If this is the case please offer her a clinic visit with me  Thank you  JMP

## 2021-01-19 ENCOUNTER — Other Ambulatory Visit: Payer: Self-pay | Admitting: Family Medicine

## 2021-01-21 ENCOUNTER — Other Ambulatory Visit: Payer: Self-pay

## 2021-01-21 ENCOUNTER — Ambulatory Visit: Payer: Medicare Other | Attending: Family Medicine | Admitting: Occupational Therapy

## 2021-01-21 ENCOUNTER — Encounter: Payer: Self-pay | Admitting: Occupational Therapy

## 2021-01-21 DIAGNOSIS — R600 Localized edema: Secondary | ICD-10-CM

## 2021-01-21 DIAGNOSIS — M25642 Stiffness of left hand, not elsewhere classified: Secondary | ICD-10-CM

## 2021-01-21 DIAGNOSIS — M79642 Pain in left hand: Secondary | ICD-10-CM | POA: Diagnosis not present

## 2021-01-21 DIAGNOSIS — R29898 Other symptoms and signs involving the musculoskeletal system: Secondary | ICD-10-CM

## 2021-01-21 DIAGNOSIS — R203 Hyperesthesia: Secondary | ICD-10-CM | POA: Diagnosis not present

## 2021-01-21 DIAGNOSIS — Z9181 History of falling: Secondary | ICD-10-CM

## 2021-01-21 NOTE — Telephone Encounter (Signed)
Pt LOV was on 12/03/2020 with Dr Sarajane Jews and last refill was done on 09/24/2020 for 90 tablets, Please advise if ok to refill

## 2021-01-22 NOTE — Therapy (Signed)
[facility]. [place], [place], [address] Phone: [phone]   Fax:  [phone]  Occupational Therapy Evaluation  Patient Details  Name: Tara Guerrero MRN: [id number] Date of Birth: Sep 25, 1946 Referring Provider (OT): Grier Mitts, MD   Encounter Date: 01/21/2021   OT End of Session - 01/21/21 1546    Visit Number 1    Number of Visits 7    Date for OT Re-Evaluation 03/04/21    Authorization Type [employer] Medicare    OT Start Time 1540    OT Stop Time 1630    OT Time Calculation (min) 50 min    Equipment Utilized During Treatment goniometer, hand dynamometer, pinch dynamometer    Activity Tolerance Patient tolerated treatment well    Behavior During Therapy WFL for tasks assessed/performed           Past Medical History:  Diagnosis Date  . 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 Depression   . Diabetes mellitus without complication ([place])   . Hyperlipidemia   . Hypertension     Past Surgical History:  Procedure Laterality Date  . ABDOMINAL HYSTERECTOMY  1976   s/p uterine CA  . BREAST EXCISIONAL BIOPSY Bilateral   . BREAST SURGERY  1995   L breast node removal  . BREAST SURGERY     R breast biopsy  . EYE MUSCLE SURGERY  2019  . EYE SURGERY  2017   bilateral cataract surgery  . JOINT REPLACEMENT  2004   bilateral knee replacements  . TONSILLECTOMY  1950    There were no vitals filed for this visit.   Subjective Assessment - 01/21/21 1541    Subjective  "This has been going on for a few years now, but recently it has been worse"    Pertinent History OA    Repetition Increases Symptoms    Special Tests AROM, grip strength, pinch strength    Patient Stated Goals "I don't want this to happen to all my fingers, that's my big fear;" using the L hand without pain    Currently in Pain? Yes    Pain Score 5     Pain Location Hand    Pain Orientation Left    Pain Descriptors / Indicators Dull    Pain Type  Chronic pain    Pain Onset More than a month ago    Pain Frequency Constant    Aggravating Factors  Tender to touch, cold    Pain Relieving Factors "Not using it;" occasionally Tylenol             [facility] OT Assessment - 01/21/21 1547      Assessment   Medical Diagnosis OA of L hand    Referring Provider (OT) Grier Mitts, MD    Hand Dominance Right    Prior Therapy Yes      Precautions   Precautions None      Restrictions   Weight Bearing Restrictions No      Balance Screen   Has the patient fallen in the past 6 months Yes    How many times? 2   outside walking the dog; getting into bed on a step stool   Has the patient had a decrease in activity level because of a fear of falling?  No    Is the patient reluctant to leave their home because of a fear of falling?  No      Home  Environment   Family/patient  expects to be discharged to: Private residence    Type of [place] level    Multimedia programmer Yes    Additional Comments Has a dog    Lives With Family   granddaughter (73 y.o.)     Prior Function   Level of [place] Retired   Print production planner   Leisure table puzzles; uses tablet      ADL   Eating/Feeding Modified independent   Pt reports min difficulty lifting/holding objects with L thumb abducted (cylindrical grasp); Does not report difficulty cutting foods   ADL comments Pt reports no difficulty with most BADLs; occasionally experiences pain in L thumb that impacts tasks, typically resulting from repetitive movements      IADL   Light Housekeeping Does personal laundry completely   Does not report pain/difficulty with housekeeping tasks   Meal Prep Plans, prepares and serves adequate meals independently   Does not report pain/difficulty with meal prep tasks     Sensation   Additional Comments Pt demo'd significant response (pulling away) to initial light touch  of L thenar eminence and reported tingling/roughness with prolonged light touch; Pt reports no numbness or tingling around thumb at rest.      Edema   Edema Mild edema around thenar eminence/base of L thumb      Right Hand AROM   R Thumb IP 0-80 25 Degrees    R Thumb Radial ABduction/ADduction 0-55 58    R Thumb Palmar ABduction/ADduction 0-45 45      Left Hand AROM   L Thumb IP 0-80 15 Degrees    L Thumb Radial ADduction/ABduction 0-55 50    L Thumb Palmar ADduction/ABduction 0-45 27      Hand Function   Right Hand Grip (lbs) 42 lbs    Right Hand Lateral Pinch 14 lbs    Right Hand 3 Point Pinch 11 lbs    Left Hand Grip (lbs) 37 lbs    Left Hand Lateral Pinch 11 lbs    Left 3 point pinch 8 lbs    Comment Pt reported dull pain in L thumb when gripping dynamometer            OT Education - 01/21/21 1710    Education Details Education provided on role and purpose of OT. Initiation of desensitization and edema management strategies also discussed.    Person(s) Educated Patient    Methods Explanation;Demonstration;Handout    Comprehension Verbalized understanding;Need further instruction            OT Short Term Goals - 01/21/21 1747      OT SHORT TERM GOAL #1   Title Pt will verbalize understanding of desensitization and edema management techniques, including brace/compression support for L hand, and report carryover to home at least 80% of the time.    Baseline Mod sensitivity and increased edema of L thumb with no techniques for at-home management    Time 3    Period Weeks   sessions scheduled every other week   Status New    Target Date 03/04/21      OT SHORT TERM GOAL #2   Title Pt will independently verbalize/demonstrate at least 3 joint protection/pain managment strategies to implement during daily tasks.    Baseline No current joint protection or pain management strategies    Time 3    Period Weeks    Status New  Target Date 03/04/21             OT  Long Term Goals - 01/21/21 1759      OT LONG TERM GOAL #1   Title Pt will be independent with HEP for ROM/strengthening of L hand and report carryover to home.    Baseline Pt not currently participating in any targeted exercises for L hand/thumb    Time 6    Period Weeks   sessions scheduled every other week   Status New    Target Date 04/15/21      OT LONG TERM GOAL #2   Title Pt will be able to lift medium to heavy weighted objects using L hand with pain less than 3/10 at home to improve participation in meal prep/eating activities.    Baseline Pt reports intermittent pain in thumb when lifting/maintaining hold of objects (e.g. coffee mug) with L hand    Time 6    Period Weeks    Target Date 04/15/21      OT LONG TERM GOAL #3   Title Pt will be able to use tablet and phone with decreased pain as evidenced by increased L thumb palmar abduction by at least 5 degrees.    Baseline 27 degrees of L thumb palmar abduction (R thumb, 45 degrees)    Time 6    Period Weeks    Status New    Target Date 04/15/21            Plan - 01/21/21 1714    Clinical Impression Statement Pt is a 75 y.o. female who presents to OP OT due to pain and decreased ROM of L thumb. PMHx includes osteoarthritis, DM type II, HTN, HLD, and depression. Pt currently lives in a single-story townhome with her granddaughter and dog. Pt will benefit from skilled occupational therapy services to address pain management, ROM, strength, joint protection strategies, fall prevention, and functional goals to improve participation and safety with daily activities.    OT Occupational Profile and History Problem Focused Assessment - Including review of records relating to presenting problem    Occupational performance deficits (Please refer to evaluation for details): ADL's;IADL's;Leisure    Body Structure / Function / Physical Skills ADL;UE functional use;Body mechanics;Flexibility;Pain;FMC;ROM;Coordination;Decreased knowledge of  use of DME;Strength;IADL;Sensation;Edema;Dexterity    Psychosocial Skills Environmental  Adaptations    Rehab Potential Good    Clinical Decision Making Several treatment options, min-mod task modification necessary    Comorbidities Affecting Occupational Performance: May have comorbidities impacting occupational performance    Modification or Assistance to Complete Evaluation  No modification of tasks or assist necessary to complete eval    OT Frequency Biweekly   Pt requests 1x/week sessions be scheduled every other week due to financial reasons   OT Duration 6 weeks    OT Treatment/Interventions Self-care/ADL training;Ultrasound;Energy conservation;Compression bandaging;DME and/or AE instruction;Patient/family education;Paraffin;Passive range of motion;Cryotherapy;Fluidtherapy;Electrical Stimulation;Contrast Bath;Splinting;Moist Heat;Therapeutic exercise;Manual Therapy;Therapeutic activities    Plan Assess progress with desensitization strategies and edema management; Implement HEP for ROM and strengthening; Discuss joint protection and fall prevention    Consulted and Agree with Plan of Care Patient           Patient will benefit from skilled therapeutic intervention in order to improve the following deficits and impairments:   Body Structure / Function / Physical Skills: ADL,UE functional use,Body mechanics,Flexibility,Pain,FMC,ROM,Coordination,Decreased knowledge of use of DME,Strength,IADL,Sensation,Edema,Dexterity   Psychosocial Skills: Environmental  Adaptations   Visit Diagnosis: Pain in left hand  Stiffness of left hand,  not elsewhere classified  Hyperesthesia  Localized edema  Other symptoms and signs involving the musculoskeletal system  History of falling    Problem List Patient Active Problem List   Diagnosis Date Noted  . Essential hypertension 06/08/2019  . Controlled type 2 diabetes mellitus without complication, without long-term current use of insulin  ([place]) 06/08/2019     Kathrine Cords, OTR/L, MSOT 01/22/2021, 6:34 PM  [facility] [place], [place], [address] Phone: [phone]   Fax:  [phone]  Name: Tara Guerrero MRN: [id number] Date of Birth: September 14, 1946

## 2021-01-23 ENCOUNTER — Telehealth: Payer: Self-pay | Admitting: Family Medicine

## 2021-01-23 NOTE — Telephone Encounter (Signed)
Patient is requesting a refill on Bupropion.  Pharmacy: [facility]  Please advise

## 2021-01-24 ENCOUNTER — Encounter: Payer: Self-pay | Admitting: Family Medicine

## 2021-01-25 NOTE — Telephone Encounter (Signed)
Spoke with pt state that she has enough to last her for a week before the mail order arrives

## 2021-01-30 ENCOUNTER — Ambulatory Visit (INDEPENDENT_AMBULATORY_CARE_PROVIDER_SITE_OTHER): Payer: Medicare Other

## 2021-01-30 ENCOUNTER — Other Ambulatory Visit: Payer: Self-pay

## 2021-01-30 VITALS — BP 113/71 | HR 68 | Wt 185.0 lb

## 2021-01-30 DIAGNOSIS — Z0000 Encounter for general adult medical examination without abnormal findings: Secondary | ICD-10-CM

## 2021-01-30 NOTE — Progress Notes (Addendum)
Virtual Visit via Telephone Note  I connected with  Tara Guerrero on 01/30/21 at 11:00 AM EST by telephone and verified that I am speaking with the correct person using two identifiers.  Location: Patient: Home Provider: Office Persons participating in the virtual visit: patient/Nurse Health Advisor   I discussed the limitations, risks, security and privacy concerns of performing an evaluation and management service by telephone and the availability of in person appointments. The patient expressed understanding and agreed to proceed.  Interactive audio and video telecommunications were attempted between this nurse and patient, however failed, due to patient having technical difficulties OR patient did not have access to video capability.  We continued and completed visit with audio only.  Some vital signs may be absent or patient reported.   Tara Brace, LPN    Subjective:   Tara Guerrero is a 75 y.o. female who presents for Medicare Annual (Subsequent) preventive examination.  Review of Systems     Cardiac Risk Factors include: advanced age (>60mn, >>31women);diabetes mellitus;hypertension;dyslipidemia;obesity (BMI >30kg/m2)     Objective:    Today's Vitals   01/30/21 1427  BP: 113/71  Pulse: 68  Weight: 185 lb (83.9 kg)   Body mass index is 30.79 kg/m.  Advanced Directives 01/30/2021 01/21/2021 01/26/2020 01/24/2019  Does Patient Have a Medical Advance Directive? Yes Yes No Yes  Type of [place] will - - [place]  Does patient want to make changes to medical advance directive? - No - Patient declined - Yes (MAU/Ambulatory/Procedural Areas - Information given)  Copy of [place] Chart? No - copy requested - - No - copy requested  Would patient like information on creating a medical advance directive? - - Yes (MAU/Ambulatory/Procedural Areas - Information given) -     Current Medications (verified) Outpatient Encounter Medications as of 01/30/2021  Medication Sig  . APPLE CIDER VINEGAR PO Take 2 tablets by mouth 2 (two) times daily.  . Ascorbic Acid (VITAMIN C) 1000 MG tablet Take 1,000 mg by mouth daily.  .Marland Kitchenaspirin EC 81 MG tablet Take 81 mg by mouth daily.  .Marland Kitchenatorvastatin (LIPITOR) 20 MG tablet TAKE 1 TABLET BY MOUTH  DAILY  . Blood Glucose Monitoring Suppl (ONE TOUCH ULTRA 2) w/Device KIT Use to Check Blood glucose daily.( Dx:Z51.81)  . buPROPion (WELLBUTRIN SR) 100 MG 12 hr tablet TAKE 1 TABLET BY MOUTH  DAILY  . Calcium Carb-Cholecalciferol (CALCIUM 500/D) 500-400 MG-UNIT CHEW Chew by mouth daily.  . cetirizine (ZYRTEC) 10 MG chewable tablet Chew 10 mg by mouth daily.  . Cholecalciferol (VITAMIN D) 50 MCG (2000 UT) tablet Take 2,000 Units by mouth daily.  . Empagliflozin-metFORMIN HCl (SYNJARDY) 12.04-999 MG TABS Take 1,000 mg by mouth 2 (two) times daily.  . ferrous sulfate 324 (65 Fe) MG TBEC Take by mouth daily.  .Marland KitchenguaiFENesin (MUCINEX) 600 MG 12 hr tablet Take 600 mg by mouth 2 (two) times daily.  . Lactobacillus (PROBIOTIC ACIDOPHILUS PO) Take by mouth daily.  . Multiple Vitamins-Minerals (HAIR SKIN AND NAILS FORMULA PO) Take by mouth daily.  . ramipril (ALTACE) 10 MG capsule TAKE 1 CAPSULE BY MOUTH  DAILY  . verapamil (CALAN-SR) 180 MG CR tablet TAKE 1 TABLET BY MOUTH AT  BEDTIME  . [DISCONTINUED] azithromycin (ZITHROMAX Z-PAK) 250 MG tablet As directed (Patient not taking: Reported on 01/30/2021)  . [DISCONTINUED] hydrochlorothiazide (HYDRODIURIL) 25 MG tablet Take 1 tablet (25 mg  total) by mouth daily. Take 1/2 tablet by mouth daily   No facility-administered encounter medications on file as of 01/30/2021.    Allergies (verified) Codeine and Seasonal ic [cholestatin]   History: Past Medical History:  Diagnosis Date  . 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 Depression   . Diabetes mellitus without complication ([place])   . Hyperlipidemia   .  Hypertension    Past Surgical History:  Procedure Laterality Date  . ABDOMINAL HYSTERECTOMY  1976   s/p uterine CA  . BREAST EXCISIONAL BIOPSY Bilateral   . BREAST SURGERY  1995   L breast node removal  . BREAST SURGERY     R breast biopsy  . EYE MUSCLE SURGERY  2019  . EYE SURGERY  2017   bilateral cataract surgery  . JOINT REPLACEMENT  2004   bilateral knee replacements  . TONSILLECTOMY  1950   Family History  Problem Relation Age of Onset  . Cancer Mother   . Hyperlipidemia Mother   . Hypertension Mother   . Stroke Mother   . Breast cancer Mother        diagnosed in her 51's  . Alcohol abuse Father   . Cancer Sister   . Hyperlipidemia Sister   . Hypertension Sister   . Stroke Sister   . Breast cancer Sister        diagnosed in her 28's  . Mental illness Maternal Grandmother   . Hypertension Maternal Grandmother   . Hyperlipidemia Maternal Grandmother   . Mental retardation Maternal Grandmother   . Hearing loss Maternal Grandfather   . Stroke Maternal Grandfather   . Alcohol abuse Paternal Grandmother   . Alcohol abuse Paternal Grandfather   . Breast cancer Cousin        diagnosed in her 11's  . Breast cancer Sister 75  . Breast cancer Other        diagnosed late 20's   Social History   Socioeconomic History  . Marital status: Single    Spouse name: Not on file  . Number of children: 1  . Years of education: 66  . Highest education level: Associate degree: occupational, Hotel manager, or vocational program  Occupational History  . Occupation: retired  Tobacco Use  . Smoking status: Former Research scientist (life sciences)  . Smokeless tobacco: Never Used  Vaping Use  . Vaping Use: Never used  Substance and Sexual Activity  . Alcohol use: Yes    Alcohol/week: 1.0 standard drink    Types: 1 Glasses of wine per week  . Drug use: Never  . Sexual activity: Not Currently  Other Topics Concern  . Not on file  Social History Narrative   01/24/2019: Lives with granddaughter and dog.     Retired    Scientist, physiological Strain: [employer]   . Difficulty of Paying Living Expenses: Not hard at all  Food Insecurity: No Food Insecurity  . Worried About Charity fundraiser in the Last Year: Never true  . Ran Out of Food in the Last Year: Never true  Transportation Needs: No Transportation Needs  . Lack of Transportation (Medical): No  . Lack of Transportation (Non-Medical): No  Physical Activity: Sufficiently Active  . Days of Exercise per Week: 7 days  . Minutes of Exercise per Session: 40 min  Stress: No Stress Concern Present  . Feeling of Stress : Only a little  Social Connections: Moderately Isolated  . Frequency of Communication with Friends and  Family: More than three times a week  . Frequency of Social Gatherings with Friends and Family: Three times a week  . Attends Religious Services: 1 to 4 times per year  . Active Member of Clubs or Organizations: No  . Attends Archivist Meetings: Never  . Marital Status: Never married    Tobacco Counseling Counseling given: Not Answered   Clinical Intake:  Pre-visit preparation completed: Yes  Pain : No/denies pain     BMI - recorded: 31.62 Nutritional Status: BMI > 30  Obese Nutritional Risks: None Diabetes: Yes CBG done?: No (199) Did pt. bring in CBG monitor from home?: No  How often do you need to have someone help you when you read instructions, pamphlets, or other written materials from your doctor or pharmacy?: 1 - Never  Diabetic?Nutrition Risk Assessment:  Has the patient had any N/V/D within the last 2 months?  No  Does the patient have any non-healing wounds?  No  Has the patient had any unintentional weight loss or weight gain?  No   Diabetes:  Is the patient diabetic?  Yes  If diabetic, was a CBG obtained today?  Yes  Did the patient bring in their glucometer from home?  No  How often do you monitor your CBG's? Daily   Financial Strains and  Diabetes Management:  Are you having any financial strains with the device, your supplies or your medication? No .  Does the patient want to be seen by Chronic Care Management for management of their diabetes?  No  Would the patient like to be referred to a Nutritionist or for Diabetic Management?  No   Diabetic Exams:  Diabetic Eye Exam: Completed 07/2020 Diabetic Foot Exam: Overdue, Pt has been advised about the importance in completing this exam. Pt is scheduled for diabetic foot exam on pt stated at nect appt with Dr Volanda Napoleon.   Interpreter Needed?: No  Information entered by :: Charlott Rakes, LPN   Activities of Daily Living In your present state of health, do you have any difficulty performing the following activities: 01/30/2021  Hearing? Y  Comment wears hearing aids  Vision? N  Difficulty concentrating or making decisions? N  Walking or climbing stairs? N  Dressing or bathing? N  Doing errands, shopping? N  Preparing Food and eating ? N  Using the Toilet? N  In the past six months, have you accidently leaked urine? N  Do you have problems with loss of bowel control? Y  Comment loose stool at times  Managing your Medications? N  Managing your Finances? N  Housekeeping or managing your Housekeeping? N  Some recent data might be hidden    Patient Care Team: Billie Ruddy, MD as PCP - General (Family Medicine)  Indicate any recent Medical Services you may have received from other than [facility] providers in the past year (date may be approximate).     Assessment:   This is a routine wellness examination for [place].  Hearing/Vision screen  Hearing Screening   125Hz  250Hz  500Hz  1000Hz  2000Hz  3000Hz  4000Hz  6000Hz  8000Hz   Right ear:           Left ear:           Comments: Pt wears hearing aids  Vision Screening Comments: Pt follow Dr Manuella Ghazi At [facility] for annual eye exams  Dietary issues and exercise activities discussed: Current Exercise Habits: Home exercise  routine, Type of exercise: walking, Time (Minutes): 45, Frequency (Times/Week): 7, Weekly Exercise (  Minutes/Week): 315  Goals    . Patient Stated     Avoid covid    . Patient Stated     Lose weight       Depression Screen PHQ 2/9 Scores 01/30/2021 01/26/2020 01/24/2019  PHQ - 2 Score 0 0 -  Exception Documentation - - Patient refusal    Fall Risk Fall Risk  01/30/2021 01/26/2020 01/24/2019  Falls in the past year? 1 0 0  Number falls in past yr: 1 - -  Injury with Fall? 1 - -  Comment bruised arm when walking dog and fell, fell in bedroom off stool - -  Risk for fall due to : Impaired vision;History of fall(s) Medication side effect -  Follow up Falls prevention discussed Falls evaluation completed;Education provided;Falls prevention discussed -    FALL RISK PREVENTION PERTAINING TO THE HOME:  Any stairs in or around the home? Yes  If so, are there any without handrails? No  Home free of loose throw rugs in walkways, pet beds, electrical cords, etc? Yes  Adequate lighting in your home to reduce risk of falls? Yes   ASSISTIVE DEVICES UTILIZED TO PREVENT FALLS:  Life alert? No  Use of a cane, walker or w/c? No  Grab bars in the bathroom? No  Shower chair or bench in shower? No  Elevated toilet seat or a handicapped toilet? Yes   TIMED UP AND GO:  Was the test performed? No     Cognitive Function:     6CIT Screen 01/30/2021 01/26/2020  What Year? 0 points 0 points  What month? 0 points 0 points  What time? - 0 points  Count back from 20 0 points 0 points  Months in reverse 0 points 0 points  Repeat phrase 0 points 0 points  Total Score - 0    Immunizations Immunization History  Administered Date(s) Administered  . Fluad Quad(high Dose 65+) 09/23/2019  . Influenza-Unspecified 09/27/2015, 10/18/2020  . Moderna Sars-Covid-2 Vaccination 01/29/2020, 02/26/2020, 10/18/2020  . Tdap 04/10/2016  . Zoster Recombinat (Shingrix) 04/05/2017, 09/03/2017    TDAP status: Due,  Education has been provided regarding the importance of this vaccine. Advised may receive this vaccine at local pharmacy or Health Dept. Aware to provide a copy of the vaccination record if obtained from local pharmacy or Health Dept. Verbalized acceptance and understanding.  Flu Vaccine status: Up to date  Pneumococcal vaccine status: Due, Education has been provided regarding the importance of this vaccine. Advised may receive this vaccine at local pharmacy or Health Dept. Aware to provide a copy of the vaccination record if obtained from local pharmacy or Health Dept. Verbalized acceptance and understanding.  Covid-19 vaccine status: Completed vaccines  Qualifies for Shingles Vaccine? Yes   Zostavax completed Yes   Shingrix Completed?: Yes  Screening Tests Health Maintenance  Topic Date Due  . Hepatitis C Screening  Never done  . PNA vac Low Risk Adult (1 of 2 - PCV13) Never done  . HEMOGLOBIN A1C  12/30/2020  . FOOT EXAM  06/03/2021 (Originally 10/21/1956)  . COVID-19 Vaccine (4 - Booster for Moderna series) 04/18/2021  . OPHTHALMOLOGY EXAM  07/30/2021  . TETANUS/TDAP  04/10/2026  . INFLUENZA VACCINE  Completed  . DEXA SCAN  Completed    Health Maintenance  Health Maintenance Due  Topic Date Due  . Hepatitis C Screening  Never done  . PNA vac Low Risk Adult (1 of 2 - PCV13) Never done  . HEMOGLOBIN A1C  12/30/2020  Colorectal cancer screening: Type of screening: Colonoscopy. Completed 07/27/14. Repeat every as needed  years  Mammogram status: Completed 07/10/20. Repeat every year  Bone Density status: Completed 08/11/19. Results reflect: Bone density results: NORMAL. Repeat every 2 years.    Additional Screening:  Hepatitis C Screening: does qualify  Vision Screening: Recommended annual ophthalmology exams for early detection of glaucoma and other disorders of the eye. Is the patient up to date with their annual eye exam?  Yes  Who is the provider or what is the  name of the office in which the patient attends annual eye exams? Dr Manuella Ghazi at [facility]   Dental Screening: Recommended annual dental exams for proper oral hygiene  Community Resource Referral / Chronic Care Management: CRR required this visit?  No   CCM required this visit?  No      Plan:     I have personally reviewed and noted the following in the patient's chart:   . Medical and social history . Use of alcohol, tobacco or illicit drugs  . Current medications and supplements . Functional ability and status . Nutritional status . Physical activity . Advanced directives . List of other physicians . Hospitalizations, surgeries, and ER visits in previous 12 months . Vitals . Screenings to include cognitive, depression, and falls . Referrals and appointments  In addition, I have reviewed and discussed with patient certain preventive protocols, quality metrics, and best practice recommendations. A written personalized care plan for preventive services as well as general preventive health recommendations were provided to patient.     Tara Brace, LPN   03/31/9264   Nurse Notes: None

## 2021-01-30 NOTE — Patient Instructions (Addendum)
Tara Guerrero , Thank you for taking time to come for your Medicare Wellness Visit. I appreciate your ongoing commitment to your health goals. Please review the following plan we discussed and let me know if I can assist you in the future.   Screening recommendations/referrals: Colonoscopy: Done 07/27/14 Mammogram: Done 07/10/20 Bone Density: Done 08/11/19 Recommended yearly ophthalmology/optometry visit for glaucoma screening and checkup Recommended yearly dental visit for hygiene and checkup  Vaccinations: Influenza vaccine: Done 10/18/20 Pneumococcal vaccine: Due and discussed Tdap vaccine: Done 04/10/16, confirmed By Pharmacist Santiago Glad  Shingles vaccine: Confirmed 4/8 & 09/03/17   @ [facility]  Covid-19:Completed 1/31, 2/2/, & 10/18/20  Advanced directives: Please bring a copy of your health care power of attorney and living will to the office at your convenience.  Conditions/risks identified: Lose weight   Next appointment: Follow up in one year for your annual wellness visit    Preventive Care 65 Years and Older, Female Preventive care refers to lifestyle choices and visits with your health care provider that can promote health and wellness. What does preventive care include?  A yearly physical exam. This is also called an annual well check.  Dental exams once or twice a year.  Routine eye exams. Ask your health care provider how often you should have your eyes checked.  Personal lifestyle choices, including:  Daily care of your teeth and gums.  Regular physical activity.  Eating a healthy diet.  Avoiding tobacco and drug use.  Limiting alcohol use.  Practicing safe sex.  Taking low-dose aspirin every day.  Taking vitamin and mineral supplements as recommended by your health care provider. What happens during an annual well check? The services and screenings done by your health care provider during your annual well check will depend on your age, overall health,  lifestyle risk factors, and family history of disease. Counseling  Your health care provider may ask you questions about your:  Alcohol use.  Tobacco use.  Drug use.  Emotional well-being.  Home and relationship well-being.  Sexual activity.  Eating habits.  History of falls.  Memory and ability to understand (cognition).  Work and work Statistician.  Reproductive health. Screening  You may have the following tests or measurements:  Height, weight, and BMI.  Blood pressure.  Lipid and cholesterol levels. These may be checked every 5 years, or more frequently if you are over 61 years old.  Skin check.  Lung cancer screening. You may have this screening every year starting at age 23 if you have a 30-pack-year history of smoking and currently smoke or have quit within the past 15 years.  Fecal occult blood test (FOBT) of the stool. You may have this test every year starting at age 27.  Flexible sigmoidoscopy or colonoscopy. You may have a sigmoidoscopy every 5 years or a colonoscopy every 10 years starting at age 32.  Hepatitis C blood test.  Hepatitis B blood test.  Sexually transmitted disease (STD) testing.  Diabetes screening. This is done by checking your blood sugar (glucose) after you have not eaten for a while (fasting). You may have this done every 1-3 years.  Bone density scan. This is done to screen for osteoporosis. You may have this done starting at age 27.  Mammogram. This may be done every 1-2 years. Talk to your health care provider about how often you should have regular mammograms. Talk with your health care provider about your test results, treatment options, and if necessary, the need for more tests. Vaccines  Your health care provider may recommend certain vaccines, such as:  Influenza vaccine. This is recommended every year.  Tetanus, diphtheria, and acellular pertussis (Tdap, Td) vaccine. You may need a Td booster every 10 years.  Zoster  vaccine. You may need this after age 66.  Pneumococcal 13-valent conjugate (PCV13) vaccine. One dose is recommended after age 21.  Pneumococcal polysaccharide (PPSV23) vaccine. One dose is recommended after age 77. Talk to your health care provider about which screenings and vaccines you need and how often you need them. This information is not intended to replace advice given to you by your health care provider. Make sure you discuss any questions you have with your health care provider. Document Released: 01/11/2016 Document Revised: 09/03/2016 Document Reviewed: 10/16/2015 Elsevier Interactive Patient Education  2017 [place] Prevention in the Home Falls can cause injuries. They can happen to people of all ages. There are many things you can do to make your home safe and to help prevent falls. What can I do on the outside of my home?  Regularly fix the edges of walkways and driveways and fix any cracks.  Remove anything that might make you trip as you walk through a door, such as a raised step or threshold.  Trim any bushes or trees on the path to your home.  Use bright outdoor lighting.  Clear any walking paths of anything that might make someone trip, such as rocks or tools.  Regularly check to see if handrails are loose or broken. Make sure that both sides of any steps have handrails.  Any raised decks and porches should have guardrails on the edges.  Have any leaves, snow, or ice cleared regularly.  Use sand or salt on walking paths during winter.  Clean up any spills in your garage right away. This includes oil or grease spills. What can I do in the bathroom?  Use night lights.  Install grab bars by the toilet and in the tub and shower. Do not use towel bars as grab bars.  Use non-skid mats or decals in the tub or shower.  If you need to sit down in the shower, use a plastic, non-slip stool.  Keep the floor dry. Clean up any water that spills on the  floor as soon as it happens.  Remove soap buildup in the tub or shower regularly.  Attach bath mats securely with double-sided non-slip rug tape.  Do not have throw rugs and other things on the floor that can make you trip. What can I do in the bedroom?  Use night lights.  Make sure that you have a light by your bed that is easy to reach.  Do not use any sheets or blankets that are too big for your bed. They should not hang down onto the floor.  Have a firm chair that has side arms. You can use this for support while you get dressed.  Do not have throw rugs and other things on the floor that can make you trip. What can I do in the kitchen?  Clean up any spills right away.  Avoid walking on wet floors.  Keep items that you use a lot in easy-to-reach places.  If you need to reach something above you, use a strong step stool that has a grab bar.  Keep electrical cords out of the way.  Do not use floor polish or wax that makes floors slippery. If you must use wax, use non-skid floor wax.  Do  not have throw rugs and other things on the floor that can make you trip. What can I do with my stairs?  Do not leave any items on the stairs.  Make sure that there are handrails on both sides of the stairs and use them. Fix handrails that are broken or loose. Make sure that handrails are as long as the stairways.  Check any carpeting to make sure that it is firmly attached to the stairs. Fix any carpet that is loose or worn.  Avoid having throw rugs at the top or bottom of the stairs. If you do have throw rugs, attach them to the floor with carpet tape.  Make sure that you have a light switch at the top of the stairs and the bottom of the stairs. If you do not have them, ask someone to add them for you. What else can I do to help prevent falls?  Wear shoes that:  Do not have high heels.  Have rubber bottoms.  Are comfortable and fit you well.  Are closed at the toe. Do not wear  sandals.  If you use a stepladder:  Make sure that it is fully opened. Do not climb a closed stepladder.  Make sure that both sides of the stepladder are locked into place.  Ask someone to hold it for you, if possible.  Clearly mark and make sure that you can see:  Any grab bars or handrails.  First and last steps.  Where the edge of each step is.  Use tools that help you move around (mobility aids) if they are needed. These include:  Canes.  Walkers.  Scooters.  Crutches.  Turn on the lights when you go into a dark area. Replace any light bulbs as soon as they burn out.  Set up your furniture so you have a clear path. Avoid moving your furniture around.  If any of your floors are uneven, fix them.  If there are any pets around you, be aware of where they are.  Review your medicines with your doctor. Some medicines can make you feel dizzy. This can increase your chance of falling. Ask your doctor what other things that you can do to help prevent falls. This information is not intended to replace advice given to you by your health care provider. Make sure you discuss any questions you have with your health care provider. Document Released: 10/11/2009 Document Revised: 05/22/2016 Document Reviewed: 01/19/2015 Elsevier Interactive Patient Education  2017 [employer].

## 2021-02-05 ENCOUNTER — Ambulatory Visit: Payer: Medicare Other | Admitting: Occupational Therapy

## 2021-02-07 ENCOUNTER — Other Ambulatory Visit: Payer: Self-pay

## 2021-02-07 ENCOUNTER — Ambulatory Visit: Payer: Medicare Other | Attending: Family Medicine | Admitting: Occupational Therapy

## 2021-02-07 ENCOUNTER — Encounter: Payer: Self-pay | Admitting: Occupational Therapy

## 2021-02-07 DIAGNOSIS — R600 Localized edema: Secondary | ICD-10-CM

## 2021-02-07 DIAGNOSIS — M79642 Pain in left hand: Secondary | ICD-10-CM | POA: Diagnosis not present

## 2021-02-07 DIAGNOSIS — R29898 Other symptoms and signs involving the musculoskeletal system: Secondary | ICD-10-CM

## 2021-02-07 DIAGNOSIS — R203 Hyperesthesia: Secondary | ICD-10-CM

## 2021-02-07 DIAGNOSIS — Z9181 History of falling: Secondary | ICD-10-CM

## 2021-02-07 DIAGNOSIS — M25642 Stiffness of left hand, not elsewhere classified: Secondary | ICD-10-CM | POA: Diagnosis not present

## 2021-02-08 NOTE — Therapy (Addendum)
[address]. [place], [place], [address] Phone: [phone]   Fax:  [phone]  Occupational Therapy Treatment & Discharge Summary  Patient Details  Name: Tara Guerrero MRN: [id number] Date of Birth: 1946-06-05 Referring Provider (OT): Grier Mitts, MD   Encounter Date: 02/07/2021   OT End of Session - 02/07/21 1407    Visit Number 2    Number of Visits 7    Date for OT Re-Evaluation 03/04/21    Authorization Type [employer]    OT Start Time 1402    OT Stop Time 1436    OT Time Calculation (min) 34 min    Equipment Utilized During Treatment --    Activity Tolerance Patient tolerated treatment well    Behavior During Therapy [facility] for tasks assessed/performed           Past Medical History:  Diagnosis Date  . 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 Depression   . Diabetes mellitus without complication ([place])   . Hyperlipidemia   . Hypertension     Past Surgical History:  Procedure Laterality Date  . ABDOMINAL HYSTERECTOMY  1976   s/p uterine CA  . BREAST EXCISIONAL BIOPSY Bilateral   . BREAST SURGERY  1995   L breast node removal  . BREAST SURGERY     R breast biopsy  . EYE MUSCLE SURGERY  2019  . EYE SURGERY  2017   bilateral cataract surgery  . JOINT REPLACEMENT  2004   bilateral knee replacements  . TONSILLECTOMY  1950    There were no vitals filed for this visit.   Subjective Assessment - 02/07/21 1406    Subjective  "The sensitivity has gotten better, but I haven't done the things you gave me last time." Pt also reports the pain and decreased movement she was experiencing have improved significantly since previous OT session and that she has been able to find strategies independently that she thinks have helped alleviate pain. Pt stated she believes she does not need to continue with therapy for this issue at this time.    Pertinent History OA    Repetition Increases Symptoms    Special Tests  AROM, grip strength, pinch strength    Patient Stated Goals "I don't want this to happen to all my fingers, that's my big fear;" using the L hand without pain    Currently in Pain? No/denies            OT Treatments/Exercises (OP) - 02/08/21 1506      ADLs   General Comments OT provided education regarding joint protection, pain management, edema reduction, desensitization, and stretches/ROM exercises for the thumb to improve participation and safety with ADLs   Handouts provided; Strategies included, but not limitied to, using larger muscle groups, avoiding repetitive movements, use of hot/cold modalities, and gentle massage     Hand Exercises   Other Hand Exercises Stretches for thumb composite flexion, palmar to radial abduction, and thumb circumduction   Handout provided; pt completed exercises 10x each and OT provided demo with verbal and tactile cues for correct form and speed           OT Education - 02/08/21 1440    Education Details Education provided on joint protection strategies, pain management techniques, and stretches/exercises for the L thumb. OT also reviewed edema management and desensitization strategies discussed during previous visit.    Person(s) Educated Patient    Methods Explanation;Demonstration;Handout  Comprehension Verbalized understanding;Need further instruction;Returned demonstration            OT Short Term Goals - 02/08/21 1452      OT SHORT TERM GOAL #1   Title Pt will verbalize understanding of desensitization and edema management techniques, including brace/compression support for L hand, and report carryover to home at least 80% of the time.    Baseline Mod sensitivity and increased edema of L thumb with no techniques for at-home management    Time 3    Period Weeks   sessions scheduled every other week   Status Partially Met   Pt verbalizes understanding of strategies, but reports symptoms have improved on their own and not needing to  carryover techniques to home (02/07/21)   Target Date 03/04/21      OT SHORT TERM GOAL #2   Title Pt will independently verbalize/demonstrate at least 3 joint protection/pain managment strategies to implement during daily tasks.    Baseline No current joint protection or pain management strategies    Time 3    Period Weeks    Status Partially Met   Joint protection and pain management strategies introduced this session and pt verbalized understanding; handout provided (02/07/21). Pt encouraged to continue to incorporate strategies at home despite current improvement in pain.   Target Date 03/04/21            OT Long Term Goals - 02/08/21 1458      OT LONG TERM GOAL #1   Title Pt will be independent with HEP for ROM/strengthening of L hand and report carryover to home.    Baseline Pt not currently participating in any targeted exercises for L hand/thumb    Time 6    Period Weeks   sessions scheduled every other week   Status Partially Met   OT introduced light stretching and ROM exercises for the thumb. Pt verbalized understanding and returned demonstration of exercises; handout was provided (02/07/21)     OT LONG TERM GOAL #2   Title Pt will be able to lift medium to heavy weighted objects using L hand with pain less than 3/10 at home to improve participation in meal prep/eating activities.    Baseline Pt reports intermittent pain in thumb when lifting/maintaining hold of objects (e.g. coffee mug) with L hand    Time 6    Period Weeks    Status Unable to assess   Per pt report, pain has decreased significantly with lifting objects. OT reviewed pain management and joint protection strategies and encouraged pt to continue to carryover techniques to home (02/07/21)     OT LONG TERM GOAL #3   Title Pt will be able to use tablet and phone with decreased pain as evidenced by increased L thumb palmar abduction by at least 5 degrees.    Baseline 27 degrees of L thumb palmar abduction (R thumb,  45 degrees)    Time 6    Period Weeks    Status Achieved   L thumb palmar abduction increased by 10 degrees and pt reports independently finding compensatory strategies for phone use at home that she believes have been helpful (02/07/21)           Plan - 02/08/21 1222    Clinical Impression Statement Pt is a 75 y.o. female who presented to OP OT due to pain and decreased ROM of L thumb. Due to pt-reported improvement in symptoms, absence of difficulties with functional activities, and pt's desire to d/c therapy,  skilled occupational therapy services for this issue are no longer warranted at this time. Pt encouraged to call back if she experiences any changes or notices a development of limitations during functional activities.    OT Occupational Profile and History Problem Focused Assessment - Including review of records relating to presenting problem    Occupational performance deficits (Please refer to evaluation for details): ADL's;IADL's;Leisure    Body Structure / Function / Physical Skills ADL;UE functional use;Body mechanics;Flexibility;Pain;FMC;ROM;Coordination;Decreased knowledge of use of DME;Strength;IADL;Sensation;Edema;Dexterity    Psychosocial Skills Environmental  Adaptations    Rehab Potential Good    Clinical Decision Making Several treatment options, min-mod task modification necessary    Comorbidities Affecting Occupational Performance: May have comorbidities impacting occupational performance    Modification or Assistance to Complete Evaluation  No modification of tasks or assist necessary to complete eval    OT Frequency Biweekly   Pt requests 1x/week sessions be scheduled every other week due to financial reasons   OT Duration 6 weeks    OT Treatment/Interventions Self-care/ADL training;Ultrasound;Energy conservation;Compression bandaging;DME and/or AE instruction;Patient/family education;Paraffin;Passive range of motion;Cryotherapy;Fluidtherapy;Electrical  Stimulation;Contrast Bath;Splinting;Moist Heat;Therapeutic exercise;Manual Therapy;Therapeutic activities    Plan Skilled occupational therapy services are no longer warranted or pt-desired at this time; OT encouraged pt to call back if symptoms begin to negatively impact daily activities, as well as to follow-up with physician if symptoms continue to get worse.    Consulted and Agree with Plan of Care Patient           Patient will benefit from skilled therapeutic intervention in order to improve the following deficits and impairments:   Body Structure / Function / Physical Skills: ADL,UE functional use,Body mechanics,Flexibility,Pain,FMC,ROM,Coordination,Decreased knowledge of use of DME,Strength,IADL,Sensation,Edema,Dexterity   Psychosocial Skills: Environmental  Adaptations    OCCUPATIONAL THERAPY DISCHARGE SUMMARY  Visits from Start of Care: 2  Current functional level related to goals / functional outcomes: L thumb palmar abduction improved by 10 degrees and pt reports being able to lift objects of various weights with minimal pain in her L hand   Remaining deficits: Occasional discomfort/pain and decreased ROM   Education / Equipment: HEP, pain management, desensitization, edema management, and joint protection  Plan: Patient agrees to discharge.  Patient goals were partially met. Patient is being discharged due to the patient's request.  ?????       Visit Diagnosis: Pain in left hand  Stiffness of left hand, not elsewhere classified  Hyperesthesia  Localized edema  Other symptoms and signs involving the musculoskeletal system  History of falling    Problem List Patient Active Problem List   Diagnosis Date Noted  . Essential hypertension 06/08/2019  . Controlled type 2 diabetes mellitus without complication, without long-term current use of insulin ([place]) 06/08/2019     Kathrine Cords, OTR/L, MSOT 02/08/2021, 3:19 PM  [address]. [place], [place], [address] Phone: [phone]   Fax:  [phone]  Name: Tara Guerrero MRN: [id number] Date of Birth: 1946/05/28

## 2021-02-12 ENCOUNTER — Ambulatory Visit: Payer: Medicare Other | Admitting: Occupational Therapy

## 2021-02-19 ENCOUNTER — Ambulatory Visit: Payer: Medicare Other | Admitting: Occupational Therapy

## 2021-03-05 ENCOUNTER — Ambulatory Visit: Payer: Medicare Other | Admitting: Occupational Therapy

## 2021-03-15 DIAGNOSIS — L570 Actinic keratosis: Secondary | ICD-10-CM | POA: Diagnosis not present

## 2021-03-15 DIAGNOSIS — L821 Other seborrheic keratosis: Secondary | ICD-10-CM | POA: Diagnosis not present

## 2021-03-21 ENCOUNTER — Other Ambulatory Visit: Payer: Self-pay | Admitting: Family Medicine

## 2021-04-17 ENCOUNTER — Encounter: Payer: Self-pay | Admitting: *Deleted

## 2021-04-19 ENCOUNTER — Telehealth (INDEPENDENT_AMBULATORY_CARE_PROVIDER_SITE_OTHER): Payer: Medicare Other | Admitting: Family Medicine

## 2021-04-19 ENCOUNTER — Encounter: Payer: Self-pay | Admitting: Family Medicine

## 2021-04-19 DIAGNOSIS — K5900 Constipation, unspecified: Secondary | ICD-10-CM | POA: Diagnosis not present

## 2021-04-19 NOTE — Progress Notes (Signed)
Virtual Visit via Video Note  I connected with Tara Guerrero on 04/19/21 at  8:30 AM EDT by a video enabled telemedicine application 2/2 [id number] pandemic and verified that I am speaking with the correct person using two identifiers.  Location patient: home Location provider:work or home office Persons participating in the virtual visit: patient, provider  I discussed the limitations of evaluation and management by telemedicine and the availability of in person appointments. The patient expressed understanding and agreed to proceed.   HPI: Pt having abd pain and some bloating .  Was very constipated last wk.  Having soft BMs q 4-5 days after taking miralax, stool softener (store brand colace/senna), and mag citrate.  Pt eating eggs, a sandwchich.  Drinking mostly water.  Has coffee in the morning.  States needs increased vegetable intake.  Taking a probiotic daily.  Denies cramping, n/v, fever, chills, pain with defecation, LLQ pain.   ROS: See pertinent positives and negatives per HPI.  Past Medical History:  Diagnosis Date  . 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 Depression   . Diabetes mellitus without complication ([place])   . Diverticulosis   . Hyperlipidemia   . Hyperplastic colon polyp 11/2014  . Hypertension   . Sleep apnea     Past Surgical History:  Procedure Laterality Date  . ABDOMINAL HYSTERECTOMY  1976   s/p uterine CA  . BREAST EXCISIONAL BIOPSY Bilateral   . BREAST SURGERY  1995   L breast node removal  . BREAST SURGERY     R breast biopsy  . EYE MUSCLE SURGERY  2019  . EYE SURGERY  2017   bilateral cataract surgery  . JOINT REPLACEMENT  2004   bilateral knee replacements  . TONSILLECTOMY  1950    Family History  Problem Relation Age of Onset  . Cancer Mother   . Hyperlipidemia Mother   . Hypertension Mother   . Stroke Mother   . Breast cancer Mother        diagnosed in her 62's  . Alcohol abuse Father   . Cancer Sister   . Hyperlipidemia Sister   .  Hypertension Sister   . Stroke Sister   . Breast cancer Sister        diagnosed in her 41's  . Mental illness Maternal Grandmother   . Hypertension Maternal Grandmother   . Hyperlipidemia Maternal Grandmother   . Mental retardation Maternal Grandmother   . Hearing loss Maternal Grandfather   . Stroke Maternal Grandfather   . Alcohol abuse Paternal Grandmother   . Alcohol abuse Paternal Grandfather   . Breast cancer Cousin        diagnosed in her 60's  . Breast cancer Sister 83  . Breast cancer Other        diagnosed late 20's     Current Outpatient Medications:  .  APPLE CIDER VINEGAR PO, Take 2 tablets by mouth 2 (two) times daily., Disp: , Rfl:  .  Ascorbic Acid (VITAMIN C) 1000 MG tablet, Take 1,000 mg by mouth daily., Disp: , Rfl:  .  aspirin EC 81 MG tablet, Take 81 mg by mouth daily., Disp: , Rfl:  .  atorvastatin (LIPITOR) 20 MG tablet, TAKE 1 TABLET BY MOUTH  DAILY, Disp: 90 tablet, Rfl: 0 .  Blood Glucose Monitoring Suppl (ONE TOUCH ULTRA 2) w/Device KIT, Use to Check Blood glucose daily.( Dx:Z51.81), Disp: 1 each, Rfl: 0 .  buPROPion (WELLBUTRIN SR) 100 MG 12 hr tablet, TAKE 1 TABLET  BY MOUTH  DAILY, Disp: 90 tablet, Rfl: 3 .  Calcium Carb-Cholecalciferol (CALCIUM 500/D) 500-400 MG-UNIT CHEW, Chew by mouth daily., Disp: , Rfl:  .  cetirizine (ZYRTEC) 10 MG chewable tablet, Chew 10 mg by mouth daily., Disp: , Rfl:  .  Cholecalciferol (VITAMIN D) 50 MCG (2000 UT) tablet, Take 2,000 Units by mouth daily., Disp: , Rfl:  .  Empagliflozin-metFORMIN HCl (SYNJARDY) 12.04-999 MG TABS, Take 1,000 mg by mouth 2 (two) times daily., Disp: 180 tablet, Rfl: 5 .  ferrous sulfate 324 (65 Fe) MG TBEC, Take by mouth daily., Disp: , Rfl:  .  guaiFENesin (MUCINEX) 600 MG 12 hr tablet, Take 600 mg by mouth 2 (two) times daily., Disp: , Rfl:  .  Lactobacillus (PROBIOTIC ACIDOPHILUS PO), Take by mouth daily., Disp: , Rfl:  .  Multiple Vitamins-Minerals (HAIR SKIN AND NAILS FORMULA PO), Take by  mouth daily., Disp: , Rfl:  .  ramipril (ALTACE) 10 MG capsule, TAKE 1 CAPSULE BY MOUTH  DAILY, Disp: 90 capsule, Rfl: 0 .  verapamil (CALAN-SR) 180 MG CR tablet, TAKE 1 TABLET BY MOUTH AT  BEDTIME, Disp: 90 tablet, Rfl: 3  EXAM:  VITALS per patient if applicable: RR between 84-78 bpm  GENERAL: alert, oriented, appears well and in no acute distress  HEENT: atraumatic, conjunctiva clear, no obvious abnormalities on inspection of external nose and ears  NECK: normal movements of the head and neck  LUNGS: on inspection no signs of respiratory distress, breathing rate appears normal, no obvious gross SOB, gasping or wheezing  CV: no obvious cyanosis  MS: moves all visible extremities without noticeable abnormality  PSYCH/NEURO: pleasant and cooperative, no obvious depression or anxiety, speech and thought processing grossly intact  ASSESSMENT AND PLAN:  Discussed the following assessment and plan:  Constipation, unspecified constipation type -discussed possible causes. -Diet changes recommended including increasing vegetable intake -will d/c stool softener and mag citrate -MiraLAX twice daily -Given precautions  For continued or worsening symptoms patient advised to call the weekend or make an appointment for Monday if needed   I discussed the assessment and treatment plan with the patient. The patient was provided an opportunity to ask questions and all were answered. The patient agreed with the plan and demonstrated an understanding of the instructions.   The patient was advised to call back or seek an in-person evaluation if the symptoms worsen or if the condition fails to improve as anticipated.  Billie Ruddy, MD

## 2021-04-23 ENCOUNTER — Ambulatory Visit: Payer: Medicare Other | Admitting: Internal Medicine

## 2021-04-25 ENCOUNTER — Other Ambulatory Visit: Payer: Self-pay

## 2021-04-26 ENCOUNTER — Ambulatory Visit (INDEPENDENT_AMBULATORY_CARE_PROVIDER_SITE_OTHER): Payer: Medicare Other | Admitting: Family Medicine

## 2021-04-26 ENCOUNTER — Ambulatory Visit (INDEPENDENT_AMBULATORY_CARE_PROVIDER_SITE_OTHER): Payer: Medicare Other

## 2021-04-26 ENCOUNTER — Encounter: Payer: Self-pay | Admitting: Family Medicine

## 2021-04-26 VITALS — BP 142/68 | HR 78 | Temp 97.8°F | Wt 188.2 lb

## 2021-04-26 DIAGNOSIS — E119 Type 2 diabetes mellitus without complications: Secondary | ICD-10-CM | POA: Diagnosis not present

## 2021-04-26 DIAGNOSIS — R109 Unspecified abdominal pain: Secondary | ICD-10-CM | POA: Diagnosis not present

## 2021-04-26 DIAGNOSIS — Z1159 Encounter for screening for other viral diseases: Secondary | ICD-10-CM

## 2021-04-26 DIAGNOSIS — M545 Low back pain, unspecified: Secondary | ICD-10-CM

## 2021-04-26 DIAGNOSIS — R1084 Generalized abdominal pain: Secondary | ICD-10-CM | POA: Diagnosis not present

## 2021-04-26 DIAGNOSIS — R140 Abdominal distension (gaseous): Secondary | ICD-10-CM | POA: Diagnosis not present

## 2021-04-26 DIAGNOSIS — K5900 Constipation, unspecified: Secondary | ICD-10-CM

## 2021-04-26 LAB — COMPREHENSIVE METABOLIC PANEL
ALT: 21 U/L (ref 0–35)
AST: 17 U/L (ref 0–37)
Albumin: 4.4 g/dL (ref 3.5–5.2)
Alkaline Phosphatase: 69 U/L (ref 39–117)
BUN: 18 mg/dL (ref 6–23)
CO2: 24 mEq/L (ref 19–32)
Calcium: 9.7 mg/dL (ref 8.4–10.5)
Chloride: 99 mEq/L (ref 96–112)
Creatinine, Ser: 0.97 mg/dL (ref 0.40–1.20)
GFR: 56.76 mL/min — ABNORMAL LOW (ref >60.00)
Glucose, Bld: 162 mg/dL — ABNORMAL HIGH (ref 70–99)
Potassium: 4.2 mEq/L (ref 3.5–5.1)
Sodium: 137 mEq/L (ref 135–145)
Total Bilirubin: 0.4 mg/dL (ref 0.2–1.2)
Total Protein: 6.9 g/dL (ref 6.0–8.3)

## 2021-04-26 LAB — CBC WITH DIFFERENTIAL/PLATELET
Basophils Absolute: 0.1 10*3/uL (ref 0.0–0.1)
Basophils Relative: 0.8 % (ref 0.0–3.0)
Eosinophils Absolute: 0.5 10*3/uL (ref 0.0–0.7)
Eosinophils Relative: 5.8 % — ABNORMAL HIGH (ref 0.0–5.0)
HCT: 42.8 % (ref 36.0–46.0)
Hemoglobin: 14.3 g/dL (ref 12.0–15.0)
Lymphocytes Relative: 23.6 % (ref 12.0–46.0)
Lymphs Abs: 2.2 10*3/uL (ref 0.7–4.0)
MCHC: 33.4 g/dL (ref 30.0–36.0)
MCV: 84 fl (ref 78.0–100.0)
Monocytes Absolute: 0.7 10*3/uL (ref 0.1–1.0)
Monocytes Relative: 7.8 % (ref 3.0–12.0)
Neutro Abs: 5.8 10*3/uL (ref 1.4–7.7)
Neutrophils Relative %: 62 % (ref 43.0–77.0)
Platelets: 290 10*3/uL (ref 150.0–400.0)
RBC: 5.09 Mil/uL (ref 3.87–5.11)
RDW: 13.8 % (ref 11.5–15.5)
WBC: 9.3 10*3/uL (ref 4.0–10.5)

## 2021-04-26 LAB — POCT URINALYSIS DIPSTICK
Bilirubin, UA: NEGATIVE
Blood, UA: NEGATIVE
Glucose, UA: POSITIVE — AB
Ketones, UA: NEGATIVE
Leukocytes, UA: NEGATIVE
Nitrite, UA: NEGATIVE
Protein, UA: NEGATIVE
Spec Grav, UA: 1.01 (ref 1.010–1.025)
Urobilinogen, UA: NEGATIVE E.U./dL — AB
pH, UA: 6 (ref 5.0–8.0)

## 2021-04-26 LAB — HEMOGLOBIN A1C: Hgb A1c MFr Bld: 8.4 % — ABNORMAL HIGH (ref 4.6–6.5)

## 2021-04-26 LAB — TSH: TSH: 1.52 u[IU]/mL (ref 0.35–4.50)

## 2021-04-26 LAB — T4, FREE: Free T4: 0.91 ng/dL (ref 0.60–1.60)

## 2021-04-26 LAB — LIPASE: Lipase: 41 U/L (ref 11.0–59.0)

## 2021-04-26 NOTE — Patient Instructions (Signed)
Chronic Constipation Chronic constipation is a condition in which a person has three or fewer bowel movements a week, for 3 months or longer. This condition is especially common in older adults. What are the causes? Causes of chronic constipation may include:  Not drinking enough fluid, eating enough food or fiber, or getting enough physical activity.  Pregnancy.  A tear in the anus (anal fissure).  Blockage in the bowel (bowel obstruction).  Narrowing of the bowel (bowel stricture).  Having a long-term medical condition, such as: ? Diabetes, hypothyroidism, or iron-deficiency anemia. ? Stroke or spinal cord injury. ? Multiple sclerosis or Parkinson's disease. ? Colon cancer. ? Dementia. ? Inflammatory bowel disease (IBD), outward collapse of the rectum (rectal prolapse), or hemorrhoids.  Taking certain medicines, including: ? Narcotics. These are a certain type of prescription pain medicine. ? Antacids or iron supplements. ? Water pills (diuretics). ? Certain blood pressure medicines. ? Anti-seizure medicines. ? Antidepressants. ? Medicines for Parkinson's disease. Other causes of this condition may include:  Stress.  Problems in the nerves and muscles that control the movement of stool.  Weak or impaired pelvic floor muscles.   What increases the risk? You may be at higher risk for chronic constipation if:  You are older than age 87.  You are female.  You live in a long-term care facility.  You have a long-term disease.  You have a mental health disorder or eating disorder. What are the signs or symptoms? The main symptom of chronic constipation is having three or fewer bowel movements a week for several weeks. Other signs and symptoms may vary from person to person. These include:  Pushing hard (straining) to pass stool, or having hard or lumpy stools.  Painful bowel movements.  Having lower abdominal discomfort, such as cramps or bloating.  Being unable  to have a bowel movement when you feel the urge, or feeling like you still need to pass stool after a bowel movement.  Feeling that you have something in your rectum that is blocking or preventing bowel movements.  Seeing blood on the toilet paper or in your stool.  Worsening confusion (in older adults). How is this diagnosed? This condition may be diagnosed based on:  Your symptoms and medical history. You will be asked about your symptoms, lifestyle, diet, and any medicines that you are taking.  A physical exam. ? Your abdomen will be examined. ? A digital rectal exam may be done. For this exam, a health care provider places a lubricated, gloved finger into the rectum.  Tests to check for any underlying causes of your constipation. These may be ordered if you have bleeding in your rectum, weight loss, or a family history of colon cancer. In these cases, you may have: ? Imaging studies of the colon. These may include X-ray, ultrasound, or a CT scan. ? Blood tests. ? A procedure to examine the inside of your colon (colonoscopy). ? More specialized tests to check:  Whether your anal sphincter works well. This is a ring-shaped muscle that controls the closing of the anus.  How well food moves through your colon. ? Tests to measure the nerve signal in your pelvic floor muscles (electromyography). How is this treated? Treatment for chronic constipation depends on the cause. Most often, treatment starts with:  Being more active and getting regular exercise.  Drinking more fluids.  Adding fiber to your diet. Sources of fiber include fruits, vegetables, whole grains, and fiber supplements.  Using medicines such as stool softeners  or medicines that increase contractions in your digestive system (pro-motility agents).  Training your pelvic muscles with biofeedback.  Surgery, if there is obstruction. Treatment may also include:  Stopping or changing some medicines if they cause  constipation.  Using a fiber supplement (bulk laxative) or stool softener.  Using a prescription laxative. This works by [employer] into your colon (osmotic laxative). You may also need to see a specialist who treats conditions of the digestive system (gastroenterologist).   Follow these instructions at home: Medicines  Take over-the-counter and prescription medicines only as told by your health care provider.  If you are taking a laxative, take it as told by your health care provider. Eating and drinking  Eat a balanced diet that includes enough fiber. Ask your health care provider to recommend a diet that is right for you.  Drink clear fluids, especially water. Avoid drinking alcohol, caffeine, and soda. These can make constipation worse.  Drink enough fluid to keep your urine pale yellow.   General instructions  Get some physical activity every day. Ask your health care provider what activities are safe for you.  Get colon cancer screenings as told by your health care provider.  Keep all follow-up visits as told by your health care provider. This is important. Contact a health care provider if you have:  Three or fewer bowel movements a week.  Stools that are hard or lumpy.  Blood on the toilet paper or in your stool after you have a bowel movement.  Unexplained weight loss.  Rectum (rectal) pain.  Stool leakage.  Nausea or vomiting. Get help right away if you have:  Rectal bleeding or you pass blood clots.  Severe rectal pain.  Body tissue that pushes out (protrudes) from your anus.  Severe pain or bloating (distension) in your abdomen.  Vomiting that you cannot control. Summary  Chronic constipation is a condition in which a person has three or fewer bowel movements a week, for 3 months or longer.  You may have a higher risk for this condition if you are an older adult, you are female, or you have a long-term disease.  Treatment for this condition  depends on the cause. Most treatments for chronic constipation include adding fiber to your diet, drinking more fluids, and getting more physical activity. You may also need to treat any underlying medical conditions or stop or change certain medicines if they cause constipation.  If lifestyle changes do not relieve constipation, your health care provider may recommend taking a laxative. This information is not intended to replace advice given to you by your health care provider. Make sure you discuss any questions you have with your health care provider. Document Revised: 11/02/2019 Document Reviewed: 11/02/2019 Elsevier Patient Education  2021 [place].  Abdominal Pain, Adult Pain in the abdomen (abdominal pain) can be caused by many things. Often, abdominal pain is not serious and it gets better with no treatment or by being treated at home. However, sometimes abdominal pain is serious. Your health care provider will ask questions about your medical history and do a physical exam to try to determine the cause of your abdominal pain. Follow these instructions at home: Medicines  Take over-the-counter and prescription medicines only as told by your health care provider.  Do not take a laxative unless told by your health care provider. General instructions  Watch your condition for any changes.  Drink enough fluid to keep your urine pale yellow.  Keep all follow-up visits as  told by your health care provider. This is important.   Contact a health care provider if:  Your abdominal pain changes or gets worse.  You are not hungry or you lose weight without trying.  You are constipated or have diarrhea for more than 2-3 days.  You have pain when you urinate or have a bowel movement.  Your abdominal pain wakes you up at night.  Your pain gets worse with meals, after eating, or with certain foods.  You are vomiting and cannot keep anything down.  You have a fever.  You have  blood in your urine. Get help right away if:  Your pain does not go away as soon as your health care provider told you to expect.  You cannot stop vomiting.  Your pain is only in areas of the abdomen, such as the right side or the left lower portion of the abdomen. Pain on the right side could be caused by appendicitis.  You have bloody or black stools, or stools that look like tar.  You have severe pain, cramping, or bloating in your abdomen.  You have signs of dehydration, such as: ? Dark urine, very little urine, or no urine. ? Cracked lips. ? Dry mouth. ? Sunken eyes. ? Sleepiness. ? Weakness.  You have trouble breathing or chest pain. Summary  Often, abdominal pain is not serious and it gets better with no treatment or by being treated at home. However, sometimes abdominal pain is serious.  Watch your condition for any changes.  Take over-the-counter and prescription medicines only as told by your health care provider.  Contact a health care provider if your abdominal pain changes or gets worse.  Get help right away if you have severe pain, cramping, or bloating in your abdomen. This information is not intended to replace advice given to you by your health care provider. Make sure you discuss any questions you have with your health care provider. Document Revised: 02/03/2020 Document Reviewed: 04/25/2019 Elsevier Patient Education  2021 [place].  Type 2 Diabetes Mellitus, Self-Care, Adult When you have type 2 diabetes (type 2 diabetes mellitus), you must make sure your blood sugar (glucose) stays in a healthy range. You can do this with:  Nutrition.  Exercise.  Lifestyle changes.  Medicines or insulin, if needed.  Support from your doctors and others. What are the risks? Having diabetes can raise your risk for other long-term (chronic) health problems. You may get medicines to help prevent these problems. How to stay aware of blood sugar  Check your  blood sugar level every day, as often as told.  Have your A1C (hemoglobin A1C) level checked two or more times a year. Have it checked more often if told.  Your doctor will set personal treatment goals for you. In general, you should have these blood sugar levels: ? Before meals: 80-130 mg/dL (4.4-7.2 mmol/L). ? After meals: below 180 mg/dL (10 mmol/L). ? A1C: less than 7%.   How to manage high and low blood sugar Symptoms of high blood sugar High blood sugar is also called hyperglycemia. Know the symptoms of high blood sugar. These may include:  More thirst.  Hunger.  Feeling very tired.  Needing to pee (urinate) more often than normal.  Seeing things blurry. Symptoms of low blood sugar Low blood sugar is also called hypoglycemia. This is when blood sugar is at or below 70 mg/dL (3.9 mmol/L). Symptoms may include:  Hunger.  Feeling worried or nervous (anxious).  Feeling sweaty  and cold to the touch (clammy).  Being dizzy or light-headed.  Feeling sleepy.  A fast heartbeat.  Feeling grouchy (irritable).  Tingling or loss of feeling (numbness) around your mouth, lips, or tongue.  Restless sleep. Diabetes medicines can cause low blood sugar. You are more at risk:  While you exercise.  After exercise.  During sleep.  When you are sick.  When you skip meals or do not eat for a long time. Treating low blood sugar If you think you have low blood sugar, eat or drink something sugary right away. Keep 15 grams of a fast-acting carb (carbohydrate) with you all the time. Make sure your family and friends know how to treat you if you cannot treat yourself. Treating very low blood sugar Severe hypoglycemia is when your blood sugar is at or below 54 mg/dL (3 mmol/L). Severe hypoglycemia is an emergency. Do not wait to see if the symptoms will go away. Get medical help right away. Call your local emergency services (911 in the [place] Do not drive yourself to the hospital. You  may need a glucagon shot if you have very low blood sugar and you cannot eat or drink. Have a family member or friend learn how to check your blood sugar and how to give you a glucagon shot. Ask your doctor if you should have a kit for glucagon shots. Follow these instructions at home: Medicines  Take diabetes medicines as told. If your doctor prescribed insulin or diabetes medicines, take them each day.  Do not run out of insulin or other medicines. Plan ahead.  If you use insulin, change the amount you take based on how active you are and what foods you eat. Your doctor will tell you how to do this.  Take over-the-counter and prescription medicines only as told by your doctor. Eating and drinking  Eat healthy foods. These include: ? Low-fat (lean) proteins. ? Complex carbs, such as whole grains. ? Fresh fruits and vegetables. ? Low-fat dairy products. ? Healthy fats.  Meet with a food expert (dietitian) to make an eating plan.  Follow instructions from your doctor about what you cannot eat or drink.  Drink enough fluid to keep your pee (urine) pale yellow.  Keep track of carbs that you eat. Read food labels and learn serving sizes of foods.  Follow your sick-day plan when you cannot eat or drink as normal. Make this plan with your doctor so it is ready to use.   Activity  Exercise as told by your doctor. You may need to: ? Do stretching and strength exercises 2 or more times a week. ? Do 150 minutes or more of exercise each week that makes your heart beat faster and makes you sweat.  Spread out your exercise over 3 or more days a week.  Do not go more than 2 days in a row without exercise.  Talk with your doctor before you start a new exercise. Your doctor may tell you to change: ? How much insulin or medicines you take. ? How much food you eat. Lifestyle  Do not use any products that contain nicotine or tobacco, such as cigarettes, e-cigarettes, and chewing tobacco. If  you need help quitting, ask your doctor.  If you drink alcohol and your doctor says alcohol is safe for you: ? Limit how much you use to:  0-1 drink a day for women who are not pregnant.  0-2 drinks a day for men. ? Be aware of how much  alcohol is in your drink. In the [place] one drink equals one 12 oz bottle of beer (355 mL), one 5 oz glass of wine (148 mL), or one 1 oz glass of hard liquor (44 mL).  Learn to deal with stress. If you need help, ask your doctor. Body care  Stay up to date with your shots (immunizations).  Have your eyes and feet checked by a doctor as often as told.  Check your skin and feet every day. Check for cuts, bruises, redness, blisters, or sores.  Brush your teeth and gums two times a day. Floss one or more times a day.  Go to the dentist one or more times every 6 months.  Stay at a healthy weight.   General instructions  Share your diabetes care plan with: ? Your work or school. ? People you live with.  Carry a card or wear jewelry that says you have diabetes.  Keep all follow-up visits as told by your doctor. This is important. Questions to ask your doctor  Do I need to meet with a certified expert in diabetes education and care?  Where can I find a support group? Where to find more information  American Diabetes Association: www.diabetes.org  American Association of Diabetes Care and Education Specialists: www.diabeteseducator.org  International Diabetes Federation: MemberVerification.ca Summary  When you have type 2 diabetes, you must make sure your blood sugar (glucose) stays in a healthy range. You can do this with nutrition, exercise, medicines and insulin, and support from doctors and others.  Check your blood sugar every day, or as often as told.  Having diabetes can raise your risk for other long-term health problems. You may get medicines to help prevent these problems.  Share your diabetes management plan with people at work, school,  and home.  Keep all follow-up visits as told by your doctor. This is important. This information is not intended to replace advice given to you by your health care provider. Make sure you discuss any questions you have with your health care provider. Document Revised: 07/18/2020 Document Reviewed: 07/18/2020 Elsevier Patient Education  [place].

## 2021-04-26 NOTE — Progress Notes (Signed)
Subjective:    Patient ID: Tara Guerrero, female    DOB: 12-24-1946, 75 y.o.   MRN: [id number]  Chief Complaint  Patient presents with  . Abdominal Pain    On going for one month, was not able to have a bowel movement, however had a movement this morning.    HPI Patient was seen today for continued constipation, bloating, and abdominal pain 8/10, x 1 month.  Pt tried miralax.  Had a small BM this am.  +flatus, low back pain without radiation or movement. Denies changes in diet, n/v, fever, chills, dysuria.    Past Medical History:  Diagnosis Date  . Cancer ([place]) 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  . Depression   . Diabetes mellitus without complication ([place])   . Diverticulosis   . Hyperlipidemia   . Hyperplastic colon polyp 11/2014  . Hypertension   . Sleep apnea     Allergies  Allergen Reactions  . Codeine Other (See Comments)    Other Reaction: Other reaction  . Seasonal Ic [Cholestatin]     Seasonal allergies    ROS General: Denies fever, chills, night sweats, changes in weight, changes in appetite HEENT: Denies headaches, ear pain, changes in vision, rhinorrhea, sore throat CV: Denies CP, palpitations, SOB, orthopnea Pulm: Denies SOB, cough, wheezing GI: Denies abdominal pain, nausea, vomiting, diarrhea + constipation, abd pain, bloating GU: Denies dysuria, hematuria, frequency, vaginal discharge Msk: Denies muscle cramps, joint pains Neuro: Denies weakness, numbness, tingling Skin: Denies rashes, bruising Psych: Denies depression, anxiety, hallucinations     Objective:    Blood pressure (!) 142/68, pulse 78, temperature 97.8 F (36.6 C), temperature source Oral, weight 188 lb 3.2 oz (85.4 kg), SpO2 96 %.  Gen. Pleasant, well-nourished, in no distress, normal affect   HEENT: [place]/AT, face symmetric, conjunctiva clear, no scleral icterus, PERRLA, EOMI, nares patent without drainage Lungs: no accessory muscle use, CTAB, no wheezes or rales Cardiovascular: RRR, no m/r/g, no  peripheral edema Abdomen: BS present, soft, NT/ND, no hepatosplenomegaly. GU: normal external female genitalia.  Small external hemorrhoids.  Normal rectal tone, no stool in rectal vault, no internal hemorrhoids noted.  Hemoccult negative.  Chaperone present, T. Alveta Heimlich, CMA Musculoskeletal: No deformities, no cyanosis or clubbing, normal tone Neuro:  A&Ox3, CN II-XII intact, normal gait Skin:  Warm, no lesions/ rash   Wt Readings from Last 3 Encounters:  01/30/21 185 lb (83.9 kg)  06/29/20 190 lb (86.2 kg)  01/26/20 180 lb (81.6 kg)    Lab Results  Component Value Date   WBC 8.8 12/14/2018   HGB 14.4 12/14/2018   HCT 43.5 12/14/2018   PLT 312.0 12/14/2018   GLUCOSE 132 (H) 10/28/2019   ALT 26 10/28/2019   AST 20 10/28/2019   NA 139 10/28/2019   K 4.1 10/28/2019   CL 98 10/28/2019   CREATININE 0.85 10/28/2019   BUN 21 10/28/2019   CO2 30 10/28/2019   TSH 2.52 12/14/2018   HGBA1C 7.8 (A) 06/29/2020   MICROALBUR <0.7 10/28/2019    Assessment/Plan:  Constipation, unspecified constipation type  -discussed daily bowel regimen -diet modifications -will obtain labs -further imaging such as CT abd/pelvis based on labs -last colonoscopy 2015 with polypectomy, 5 yr repeat due, but postponed 2/2 COVID-19 pandemic. -for new or worsening symptoms follow-up with GI -given handout - Plan: POCT urinalysis dipstick, CMP, TSH, T4, Free, Iron, TIBC and Ferritin Panel, DG Abd 1 View  Generalized abdominal pain -Likely 2/2 constipation versus gas -We will obtain labs -Given strict  precautions - Plan: POCT urinalysis dipstick, CBC with Differential/Platelet, CMP, Lipase, Hep C Antibody, Tissue transglutaminase, IgA, Iron, TIBC and Ferritin Panel, DG Abd 1 View  Bloating  Acute left-sided low back pain without sciatica  - Plan: POCT urinalysis dipstick  Controlled type 2 diabetes mellitus without complication, without long-term current use of insulin (H[facility]) -hgb A1C 7.8% on  06/29/20 -Lifestyle modifications  -Synjardy 12.04-999 mg twice daily - Plan: Hemoglobin A1c  Encounter for hepatitis C screening test for low risk patient -Plan: Hep C antibody   F/u prn  Grier Mitts, MD

## 2021-04-29 LAB — TISSUE TRANSGLUTAMINASE, IGA: (tTG) Ab, IgA: <1 U/mL

## 2021-04-29 LAB — IRON,TIBC AND FERRITIN PANEL
%SAT: 22 % (calc) (ref 16–45)
Ferritin: 28 ng/mL (ref 16–288)
Iron: 90 ug/dL (ref 45–160)
TIBC: 410 mcg/dL (calc) (ref 250–450)

## 2021-04-29 LAB — HEPATITIS C ANTIBODY
Hepatitis C Ab: NONREACTIVE
SIGNAL TO CUT-OFF: 0 (ref <1.00)

## 2021-05-02 ENCOUNTER — Other Ambulatory Visit: Payer: Self-pay | Admitting: Family Medicine

## 2021-05-02 DIAGNOSIS — R141 Gas pain: Secondary | ICD-10-CM

## 2021-05-02 DIAGNOSIS — K5900 Constipation, unspecified: Secondary | ICD-10-CM

## 2021-05-02 MED ORDER — SIMETHICONE 80 MG PO TABS: 80.0000 mg | ORAL_TABLET | Freq: Three times a day (TID) | ORAL | 0 refills | Status: DC | PRN

## 2021-05-02 MED ORDER — LACTULOSE 10 GM/15ML PO SOLN: 10.0000 g | Freq: Once | ORAL | 0 refills | Status: AC

## 2021-05-06 NOTE — Progress Notes (Signed)
Patient viewed results on MyChart. 

## 2021-05-07 ENCOUNTER — Encounter: Payer: Self-pay | Admitting: Family Medicine

## 2021-05-13 ENCOUNTER — Encounter: Payer: Self-pay | Admitting: Family Medicine

## 2021-05-24 ENCOUNTER — Other Ambulatory Visit: Payer: Self-pay | Admitting: Family Medicine

## 2021-06-25 DIAGNOSIS — C44319 Basal cell carcinoma of skin of other parts of face: Secondary | ICD-10-CM | POA: Diagnosis not present

## 2021-06-25 DIAGNOSIS — C4401 Basal cell carcinoma of skin of lip: Secondary | ICD-10-CM | POA: Diagnosis not present

## 2021-06-25 DIAGNOSIS — C4372 Malignant melanoma of left lower limb, including hip: Secondary | ICD-10-CM | POA: Diagnosis not present

## 2021-06-25 DIAGNOSIS — L570 Actinic keratosis: Secondary | ICD-10-CM | POA: Diagnosis not present

## 2021-06-25 DIAGNOSIS — Z85828 Personal history of other malignant neoplasm of skin: Secondary | ICD-10-CM | POA: Diagnosis not present

## 2021-06-25 DIAGNOSIS — L821 Other seborrheic keratosis: Secondary | ICD-10-CM | POA: Diagnosis not present

## 2021-06-25 DIAGNOSIS — D0461 Carcinoma in situ of skin of right upper limb, including shoulder: Secondary | ICD-10-CM | POA: Diagnosis not present

## 2021-06-25 DIAGNOSIS — Z85820 Personal history of malignant melanoma of skin: Secondary | ICD-10-CM | POA: Diagnosis not present

## 2021-06-25 DIAGNOSIS — L720 Epidermal cyst: Secondary | ICD-10-CM | POA: Diagnosis not present

## 2021-06-29 ENCOUNTER — Other Ambulatory Visit: Payer: Self-pay | Admitting: Family Medicine

## 2021-07-25 DIAGNOSIS — C4372 Malignant melanoma of left lower limb, including hip: Secondary | ICD-10-CM | POA: Diagnosis not present

## 2021-07-25 DIAGNOSIS — D0372 Melanoma in situ of left lower limb, including hip: Secondary | ICD-10-CM | POA: Diagnosis not present

## 2021-08-21 DIAGNOSIS — H26493 Other secondary cataract, bilateral: Secondary | ICD-10-CM | POA: Diagnosis not present

## 2021-08-26 ENCOUNTER — Encounter (HOSPITAL_COMMUNITY): Payer: Self-pay | Admitting: Emergency Medicine

## 2021-08-26 ENCOUNTER — Emergency Department (HOSPITAL_COMMUNITY)
Admission: EM | Admit: 2021-08-26 | Discharge: 2021-08-26 | Disposition: A | Discharge status: Home / Self Care | Payer: Medicare Other | Attending: Emergency Medicine | Admitting: Emergency Medicine

## 2021-08-26 ENCOUNTER — Other Ambulatory Visit: Payer: Self-pay

## 2021-08-26 ENCOUNTER — Emergency Department (HOSPITAL_COMMUNITY): Payer: Medicare Other

## 2021-08-26 DIAGNOSIS — Z794 Long term (current) use of insulin: Secondary | ICD-10-CM | POA: Insufficient documentation

## 2021-08-26 DIAGNOSIS — Z79899 Other long term (current) drug therapy: Secondary | ICD-10-CM | POA: Diagnosis not present

## 2021-08-26 DIAGNOSIS — K8020 Calculus of gallbladder without cholecystitis without obstruction: Secondary | ICD-10-CM | POA: Insufficient documentation

## 2021-08-26 DIAGNOSIS — E119 Type 2 diabetes mellitus without complications: Secondary | ICD-10-CM | POA: Diagnosis not present

## 2021-08-26 DIAGNOSIS — Z87891 Personal history of nicotine dependence: Secondary | ICD-10-CM | POA: Insufficient documentation

## 2021-08-26 DIAGNOSIS — Z7982 Long term (current) use of aspirin: Secondary | ICD-10-CM | POA: Insufficient documentation

## 2021-08-26 DIAGNOSIS — I10 Essential (primary) hypertension: Secondary | ICD-10-CM | POA: Diagnosis not present

## 2021-08-26 DIAGNOSIS — Z96653 Presence of artificial knee joint, bilateral: Secondary | ICD-10-CM | POA: Insufficient documentation

## 2021-08-26 DIAGNOSIS — R1011 Right upper quadrant pain: Secondary | ICD-10-CM | POA: Diagnosis not present

## 2021-08-26 DIAGNOSIS — Z8542 Personal history of malignant neoplasm of other parts of uterus: Secondary | ICD-10-CM | POA: Diagnosis not present

## 2021-08-26 LAB — CBC WITH DIFFERENTIAL/PLATELET
Abs Immature Granulocytes: 0.03 10*3/uL (ref 0.00–0.07)
Basophils Absolute: 0.1 10*3/uL (ref 0.0–0.1)
Basophils Relative: 1 %
Eosinophils Absolute: 0.5 10*3/uL (ref 0.0–0.5)
Eosinophils Relative: 7 %
HCT: 45.3 % (ref 36.0–46.0)
Hemoglobin: 14.9 g/dL (ref 12.0–15.0)
Immature Granulocytes: 0 %
Lymphocytes Relative: 24 %
Lymphs Abs: 1.8 10*3/uL (ref 0.7–4.0)
MCH: 28.9 pg (ref 26.0–34.0)
MCHC: 32.9 g/dL (ref 30.0–36.0)
MCV: 87.8 fL (ref 80.0–100.0)
Monocytes Absolute: 0.6 10*3/uL (ref 0.1–1.0)
Monocytes Relative: 8 %
Neutro Abs: 4.5 10*3/uL (ref 1.7–7.7)
Neutrophils Relative %: 60 %
Platelets: 268 10*3/uL (ref 150–400)
RBC: 5.16 MIL/uL — ABNORMAL HIGH (ref 3.87–5.11)
RDW: 13 % (ref 11.5–15.5)
WBC: 7.5 10*3/uL (ref 4.0–10.5)
nRBC: 0 % (ref 0.0–0.2)

## 2021-08-26 LAB — URINALYSIS, ROUTINE W REFLEX MICROSCOPIC
Bacteria, UA: NONE SEEN
Bilirubin Urine: NEGATIVE
Glucose, UA: >=500 mg/dL — AB
Hgb urine dipstick: NEGATIVE
Ketones, ur: NEGATIVE mg/dL
Leukocytes,Ua: NEGATIVE
Nitrite: NEGATIVE
Protein, ur: NEGATIVE mg/dL
Specific Gravity, Urine: 1.009 (ref 1.005–1.030)
pH: 5 (ref 5.0–8.0)

## 2021-08-26 LAB — COMPREHENSIVE METABOLIC PANEL
ALT: 28 U/L (ref 0–44)
AST: 23 U/L (ref 15–41)
Albumin: 3.9 g/dL (ref 3.5–5.0)
Alkaline Phosphatase: 67 U/L (ref 38–126)
Anion gap: 10 (ref 5–15)
BUN: 12 mg/dL (ref 8–23)
CO2: 25 mmol/L (ref 22–32)
Calcium: 9.7 mg/dL (ref 8.9–10.3)
Chloride: 104 mmol/L (ref 98–111)
Creatinine, Ser: 0.91 mg/dL (ref 0.44–1.00)
GFR, Estimated: >60 mL/min (ref >60)
Glucose, Bld: 186 mg/dL — ABNORMAL HIGH (ref 70–99)
Potassium: 4.3 mmol/L (ref 3.5–5.1)
Sodium: 139 mmol/L (ref 135–145)
Total Bilirubin: 0.8 mg/dL (ref 0.3–1.2)
Total Protein: 6.7 g/dL (ref 6.5–8.1)

## 2021-08-26 LAB — LIPASE, BLOOD: Lipase: 38 U/L (ref 11–51)

## 2021-08-26 MED ORDER — ONDANSETRON 4 MG PO TBDP: 4.0000 mg | ORAL_TABLET | Freq: Three times a day (TID) | ORAL | 0 refills | Status: DC | PRN

## 2021-08-26 MED ORDER — ONDANSETRON 4 MG PO TBDP
4.0000 mg | ORAL_TABLET | Freq: Once | ORAL | Status: AC
  Administered 2021-08-26: 4 mg via ORAL
  Filled 2021-08-26: qty 1

## 2021-08-26 MED ORDER — HYDROCODONE-ACETAMINOPHEN 5-325 MG PO TABS: 2.0000 | ORAL_TABLET | Freq: Once | ORAL | Status: DC

## 2021-08-26 MED ORDER — KETOROLAC TROMETHAMINE 60 MG/2ML IM SOLN
60.0000 mg | Freq: Once | INTRAMUSCULAR | Status: AC
  Administered 2021-08-26: 60 mg via INTRAMUSCULAR
  Filled 2021-08-26: qty 2

## 2021-08-26 MED ORDER — ACETAMINOPHEN 500 MG PO TABS
1000.0000 mg | ORAL_TABLET | Freq: Once | ORAL | Status: AC
  Administered 2021-08-26: 1000 mg via ORAL
  Filled 2021-08-26: qty 2

## 2021-08-26 NOTE — ED Provider Notes (Signed)
Emergency Medicine Provider Triage Evaluation Note  Tara Guerrero , a 75 y.o. female  was evaluated in triage.  Pt complains of abd pain.  Review of Systems  Positive: RUQ pain radiates to back Negative: Fever, chills, n/v/d, cp, sob, cough, dysuria  Physical Exam  BP (!) 142/79   Pulse 70   Temp (!) 97.5 F (36.4 C) (Oral)   Resp 16   SpO2 96%  Gen:   Awake, no distress   Resp:  Normal effort  MSK:   Moves extremities without difficulty  Other:  TTP RUQ  Medical Decision Making  Medically screening exam initiated at 12:13 PM.  Appropriate orders placed.  Tara Guerrero was informed that the remainder of the evaluation will be completed by another provider, this initial triage assessment does not replace that evaluation, and the importance of remaining in the ED until their evaluation is complete.  RUQ pain x 1 week.  Has intact gallbladder, no other complaint.  No CP/SOB.    Domenic Moras, PA-C 08/26/21 1214    Godfrey Pick, MD 08/28/21 [phone]

## 2021-08-26 NOTE — ED Provider Notes (Signed)
[facility] EMERGENCY DEPARTMENT Provider Note   CSN: [id number] Arrival date & time: 08/26/21  1120     History Chief Complaint  Patient presents with   Abdominal Pain    Tara Guerrero is a 75 y.o. female PMHx HTN, HLD, T2DM, uterine cancer s/p hysterectomy who presents for evaluation of abdominal pain.   Patient reports an approximately 1 week history of intermittent right upper quadrant abdominal pain.  She denies any provoking or palliating features.  She states that she experiences associated mild nausea without vomiting.  No fevers or chills.  Over the past 3 days, she states that her right upper quadrant abdominal pain has been constant.  She subsequently presented to the emergency department for further evaluation.     Past Medical History:  Diagnosis Date   Cancer ([place]) 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   Depression    Diabetes mellitus without complication ([place])    Diverticulosis    Hyperlipidemia    Hyperplastic colon polyp 11/2014   Hypertension    Sleep apnea     Patient Active Problem List   Diagnosis Date Noted   Essential hypertension 06/08/2019   Controlled type 2 diabetes mellitus without complication, without long-term current use of insulin ([place]) 06/08/2019    Past Surgical History:  Procedure Laterality Date   ABDOMINAL HYSTERECTOMY  1976   s/p uterine CA   BREAST EXCISIONAL BIOPSY Bilateral    BREAST SURGERY  1995   L breast node removal   BREAST SURGERY     R breast biopsy   EYE MUSCLE SURGERY  2019   EYE SURGERY  2017   bilateral cataract surgery   JOINT REPLACEMENT  2004   bilateral knee replacements   TONSILLECTOMY  1950     OB History   No obstetric history on file.     Family History  Problem Relation Age of Onset   Cancer Mother    Hyperlipidemia Mother    Hypertension Mother    Stroke Mother    Breast cancer Mother        diagnosed in her 31's   Alcohol abuse Father    Cancer Sister    Hyperlipidemia Sister     Hypertension Sister    Stroke Sister    Breast cancer Sister        diagnosed in her 66's   Mental illness Maternal Grandmother    Hypertension Maternal Grandmother    Hyperlipidemia Maternal Grandmother    Mental retardation Maternal Grandmother    Hearing loss Maternal Grandfather    Stroke Maternal Grandfather    Alcohol abuse Paternal Grandmother    Alcohol abuse Paternal Grandfather    Breast cancer Cousin        diagnosed in her 31's   Breast cancer Sister 85   Breast cancer Other        diagnosed late 20's    Social History   Tobacco Use   Smoking status: Former   Smokeless tobacco: Never  Scientific laboratory technician Use: Never used  Substance Use Topics   Alcohol use: Yes    Alcohol/week: 1.0 standard drink    Types: 1 Glasses of wine per week   Drug use: Never    Home Medications Prior to Admission medications   Medication Sig Start Date End Date Taking? Authorizing Provider  acetaminophen (TYLENOL) 325 MG tablet Take 650 mg by mouth every 6 (six) hours as needed for headache or mild pain.   Yes [provider]  aspirin EC 81 MG tablet Take 81 mg by mouth daily.   Yes [provider]  atorvastatin (LIPITOR) 20 MG tablet TAKE 1 TABLET BY MOUTH  DAILY 07/02/21  Yes Billie Ruddy, MD  buPROPion [employer] SR) 100 MG 12 hr tablet TAKE 1 TABLET BY MOUTH  DAILY 01/21/21  Yes Billie Ruddy, MD  Cholecalciferol (VITAMIN D) 50 MCG (2000 UT) tablet Take 2,000 Units by mouth daily.   Yes [provider]  cycloSPORINE (RESTASIS) 0.05 % ophthalmic emulsion Place 1 drop into both eyes 2 (two) times daily.   Yes [provider]  Lactobacillus (PROBIOTIC ACIDOPHILUS PO) Take by mouth daily.   Yes [provider]  Multiple Vitamins-Minerals (HAIR SKIN AND NAILS FORMULA PO) Take by mouth daily.   Yes [provider]  ondansetron (ZOFRAN ODT) 4 MG disintegrating tablet Take 1 tablet (4 mg total) by mouth every 8 (eight) hours as  needed for nausea or vomiting. 08/26/21  Yes Violet Baldy, MD  ramipril (ALTACE) 10 MG capsule TAKE 1 CAPSULE BY MOUTH  DAILY 05/24/21  Yes Billie Ruddy, MD  Simethicone 80 MG TABS Take 1 tablet (80 mg total) by mouth 3 (three) times daily as needed. 05/02/21  Yes Billie Ruddy, MD  SYNJARDY 12.04-999 MG TABS TAKE 1 TABLET BY MOUTH  TWICE DAILY 07/02/21  Yes Billie Ruddy, MD  verapamil (CALAN-SR) 180 MG CR tablet TAKE 1 TABLET BY MOUTH AT  BEDTIME 07/02/21  Yes Billie Ruddy, MD  Blood Glucose Monitoring Suppl (ONE TOUCH ULTRA 2) w/Device KIT Use to Check Blood glucose daily.( Dx:Z51.81) 01/31/19   Billie Ruddy, MD    Allergies    Codeine and Seasonal ic [cholestatin]  Review of Systems   Review of Systems  Constitutional:  Negative for chills and fever.  HENT:  Negative for ear pain and sore throat.   Eyes:  Negative for pain and visual disturbance.  Respiratory:  Negative for cough and shortness of breath.   Cardiovascular:  Negative for chest pain and palpitations.  Gastrointestinal:  Positive for abdominal pain and nausea. Negative for vomiting.  Genitourinary:  Negative for dysuria and hematuria.  Musculoskeletal:  Negative for arthralgias and back pain.  Skin:  Negative for color change and rash.  Neurological:  Negative for seizures and syncope.  All other systems reviewed and are negative.  Physical Exam Updated Vital Signs BP 125/62   Pulse 62   Temp 98.2 F (36.8 C) (Oral)   Resp 16   SpO2 100%   Physical Exam Vitals and nursing note reviewed.  Constitutional:      General: She is not in acute distress.    Appearance: She is well-developed.  HENT:     Head: Normocephalic and atraumatic.  Eyes:     Conjunctiva/sclera: Conjunctivae normal.  Cardiovascular:     Rate and Rhythm: Normal rate and regular rhythm.     Heart sounds: No murmur heard. Pulmonary:     Effort: Pulmonary effort is normal. No respiratory distress.     Breath sounds: Normal breath  sounds.  Abdominal:     General: Abdomen is flat. Bowel sounds are normal. There is no distension.     Palpations: Abdomen is soft.     Tenderness: There is abdominal tenderness in the right upper quadrant. There is no guarding or rebound. Negative signs include Murphy's sign and McBurney's sign.     Hernia: No hernia is present.  Musculoskeletal:  Cervical back: Neck supple.  Skin:    General: Skin is warm and dry.  Neurological:     Mental Status: She is alert.    ED Results / Procedures / Treatments   Labs (all labs ordered are listed, but only abnormal results are displayed) Labs Reviewed  CBC WITH DIFFERENTIAL/PLATELET - Abnormal; Notable for the following components:      Result Value   RBC 5.16 (*)    All other components within normal limits  COMPREHENSIVE METABOLIC PANEL - Abnormal; Notable for the following components:   Glucose, Bld 186 (*)    All other components within normal limits  URINALYSIS, ROUTINE W REFLEX MICROSCOPIC - Abnormal; Notable for the following components:   Color, Urine STRAW (*)    Glucose, UA >=500 (*)    All other components within normal limits  LIPASE, BLOOD    EKG None  Radiology US Abdomen Limited  Result Date: 08/26/2021 CLINICAL DATA:  RIGHT upper quadrant pain in a 75 year old female. EXAM: ULTRASOUND ABDOMEN LIMITED RIGHT UPPER QUADRANT COMPARISON:  None aside from abdominal plain film of the same date. FINDINGS: Gallbladder: Cholelithiasis without gallbladder wall thickening or pericholecystic fluid. Reported tenderness noted by the sonographer over the gallbladder. Largest calculus 1.3 cm. Common bile duct: Diameter: 4.7 mm. Liver: Heterogeneous echotexture and nodular contour of the liver. Portal vein is patent on color Doppler imaging with normal direction of blood flow towards the liver. Other: None IMPRESSION: Cholelithiasis with reported tenderness over the gallbladder. Findings may be indicative of early cholecystitis but are  not associated with wall thickening or pericholecystic stranding. Correlation with HIDA scan may be helpful particularly if findings are equivocal on physical examination. No dilation of the common bile duct. Signs of liver disease with nodular heterogeneous liver, potentially with some background variable steatotic changes. Electronically Signed   By: Zetta Bills M.D.   On: 08/26/2021 14:05    Procedures Procedures   Medications Ordered in ED Medications  ketorolac (TORADOL) injection 60 mg (60 mg Intramuscular Given 08/26/21 1809)  ondansetron (ZOFRAN-ODT) disintegrating tablet 4 mg (4 mg Oral Given 08/26/21 1810)  acetaminophen (TYLENOL) tablet 1,000 mg (1,000 mg Oral Given 08/26/21 1809)    ED Course  I have reviewed the triage vital signs and the nursing notes.  Pertinent labs & imaging results that were available during my care of the patient were reviewed by me and considered in my medical decision making (see chart for details).  Clinical Course as of 08/27/21 1843  Mon Aug 26, 2021  6812 Symptomatic cholelithiasis, starting with symptomatic management. [CH]  2031 CBC without leukocytosis or left shift.  CMP is unremarkable without hyperbilirubinemia, transaminitis, or elevations in alkaline phosphatase.  Lipase is normal.  Urinalysis only notable for mild glucosuria.  Patient was treated with Tylenol and Toradol, with complete resolution of her pain.  No further tenderness on serial abdominal exam.  She will follow-up with general surgery in outpatient setting.  Patient was provided with prescription for Zofran.  Strict return precautions given. [CH]    Clinical Course User Index [CH] Violet Baldy, MD   MDM Rules/Calculators/A&P                          75 y.o. female with past medical history as above who presents for evaluation of right upper quadrant tenderness.  Afebrile and hemodynamically stable here.  Exam notable for mild right upper quadrant tenderness without Murphy  sign. CBC without leukocytosis or  left shift.  CMP is unremarkable without hyperbilirubinemia, transaminitis, or elevations in alkaline phosphatase.  Lipase is normal.  Urinalysis only notable for mild glucosuria.  Patient was treated with Tylenol and Toradol, with complete resolution of her pain.  No further tenderness on serial abdominal exam.  Presentation is consistent with symptomatic cholelithiasis.  The patient would like to be discharged home at this time.  She will follow-up with general surgery in outpatient setting.  Patient was provided with prescription for Zofran.  Strict return precautions given.  Final Clinical Impression(s) / ED Diagnoses Final diagnoses:  Calculus of gallbladder without cholecystitis without obstruction    Rx / DC Orders ED Discharge Orders          Ordered    ondansetron (ZOFRAN ODT) 4 MG disintegrating tablet  Every 8 hours PRN        08/26/21 2001             Violet Baldy, MD 08/27/21 1845    Elnora Morrison, MD 08/28/21 0009

## 2021-08-26 NOTE — ED Triage Notes (Signed)
Patient coming from home, complaint of abdominal pain x1 week. Worse today. A&Ox4. NAD.

## 2021-08-26 NOTE — ED Notes (Signed)
RN reviewed discharge instructions w/ pt. Follow up, prescriptions and pain management reviewed, pt had no further questions.

## 2021-08-27 ENCOUNTER — Other Ambulatory Visit: Payer: Self-pay | Admitting: Family Medicine

## 2021-08-27 ENCOUNTER — Ambulatory Visit
Admission: RE | Admit: 2021-08-27 | Discharge: 2021-08-27 | Disposition: A | Discharge status: Home / Self Care | Payer: Medicare Other | Source: Ambulatory Visit | Attending: Family Medicine | Admitting: Family Medicine

## 2021-08-27 DIAGNOSIS — Z1231 Encounter for screening mammogram for malignant neoplasm of breast: Secondary | ICD-10-CM

## 2021-09-16 ENCOUNTER — Ambulatory Visit: Payer: Self-pay | Admitting: Surgery

## 2021-09-16 DIAGNOSIS — K8010 Calculus of gallbladder with chronic cholecystitis without obstruction: Secondary | ICD-10-CM | POA: Diagnosis not present

## 2021-09-30 ENCOUNTER — Other Ambulatory Visit: Payer: Self-pay | Admitting: Family Medicine

## 2021-10-09 NOTE — Pre-Procedure Instructions (Signed)
Surgical Instructions    Your procedure is scheduled on Thursday, October 20th.  Report to [facility] Main Entrance "A" at 12:15 P.M., then check in with the Admitting office.  Call this number if you have problems the morning of surgery:  [phone]   If you have any questions prior to your surgery date call [phone]: Open Monday-Friday 8am-4pm    Remember:  Do not eat after midnight the night before your surgery  You may drink clear liquids until 11:15 a.m. the morning of your surgery.   Clear liquids allowed are: Water, Non-Citrus Juices (without pulp), Carbonated Beverages, Clear Tea, Black Coffee ONLY (NO MILK, CREAM OR POWDERED CREAMER of any kind), and Gatorade    Take these medicines the morning of surgery with A SIP OF WATER  atorvastatin (LIPITOR) buPROPion (WELLBUTRIN SR)  cetirizine (ZYRTEC)  cycloSPORINE (RESTASIS)  acetaminophen (TYLENOL)-as needed.  Follow your surgeon's instructions on when to stop Aspirin.  If no instructions were given by your surgeon then you will need to call the office to get those instructions.    As of today, STOP taking any Aspirin (unless otherwise instructed by your surgeon) Aleve, Naproxen, Ibuprofen, Motrin, Advil, Goody's, BC's, all herbal medications, fish oil, and all vitamins.  WHAT DO I DO ABOUT MY DIABETES MEDICATION?   Do not take SYNJARDY on Wednesday (10/19) or Thursday (10/20).   HOW TO MANAGE YOUR DIABETES BEFORE AND AFTER SURGERY  Why is it important to control my blood sugar before and after surgery? Improving blood sugar levels before and after surgery helps healing and can limit problems. A way of improving blood sugar control is eating a healthy diet by:  Eating less sugar and carbohydrates  Increasing activity/exercise  Talking with your doctor about reaching your blood sugar goals High blood sugars (greater than 180 mg/dL) can raise your risk of infections and slow your recovery, so you will need to focus  on controlling your diabetes during the weeks before surgery. Make sure that the doctor who takes care of your diabetes knows about your planned surgery including the date and location.  How do I manage my blood sugar before surgery? Check your blood sugar at least 4 times a day, starting 2 days before surgery, to make sure that the level is not too high or low.  Check your blood sugar the morning of your surgery when you wake up and every 2 hours until you get to the Short Stay unit.  If your blood sugar is less than 70 mg/dL, you will need to treat for low blood sugar: Do not take insulin. Treat a low blood sugar (less than 70 mg/dL) with  cup of clear juice (cranberry or apple), 4 glucose tablets, OR glucose gel. Recheck blood sugar in 15 minutes after treatment (to make sure it is greater than 70 mg/dL). If your blood sugar is not greater than 70 mg/dL on recheck, call [phone] for further instructions. Report your blood sugar to the short stay nurse when you get to Short Stay.  If you are admitted to the hospital after surgery: Your blood sugar will be checked by the staff and you will probably be given insulin after surgery (instead of oral diabetes medicines) to make sure you have good blood sugar levels. The goal for blood sugar control after surgery is 80-180 mg/dL.      After your COVID test   You are not required to quarantine however you are required to wear a well-fitting mask when you  are out and around people not in your household.  If your mask becomes wet or soiled, replace with a new one.  Wash your hands often with soap and water for 20 seconds or clean your hands with an alcohol-based hand sanitizer that contains at least 60% alcohol.  Do not share personal items.  Notify your provider: if you are in close contact with someone who has COVID  or if you develop a fever of 100.4 or greater, sneezing, cough, sore throat, shortness of breath or body aches.              Do not wear jewelry or makeup Do not wear lotions, powders, perfumes/colognes, or deodorant. Do not shave 48 hours prior to surgery.  Men may shave face and neck. Do not bring valuables to the hospital. DO Not wear nail polish, gel polish, artificial nails, or any other type of covering on natural nails including finger and toenails. If patients have artificial nails, gel coating, etc. that need to be removed by a nail salon, please have this removed prior to surgery or surgery may need to be canceled/delayed if the surgeon/ anesthesia feels like the patient is unable to be adequately monitored.             [facility] is not responsible for any belongings or valuables.  Do NOT Smoke (Tobacco/Vaping)  24 hours prior to your procedure  If you use a CPAP at night, you may bring your mask for your overnight stay.   Contacts, glasses, hearing aids, dentures or partials may not be worn into surgery, please bring cases for these belongings   For patients admitted to the hospital, discharge time will be determined by your treatment team.   Patients discharged the day of surgery will not be allowed to drive home, and someone needs to stay with them for 24 hours.  NO VISITORS WILL BE ALLOWED IN PRE-OP WHERE PATIENTS ARE PREPPED FOR SURGERY.  ONLY 1 SUPPORT PERSON MAY BE PRESENT IN THE WAITING ROOM WHILE YOU ARE IN SURGERY.  IF YOU ARE TO BE ADMITTED, ONCE YOU ARE IN YOUR ROOM YOU WILL BE ALLOWED TWO (2) VISITORS. 1 (ONE) VISITOR MAY STAY OVERNIGHT BUT MUST ARRIVE TO THE ROOM BY 8pm.  Minor children may have two parents present. Special consideration for safety and communication needs will be reviewed on a case by case basis.  Special instructions:    Oral Hygiene is also important to reduce your risk of infection.  Remember - BRUSH YOUR TEETH THE MORNING OF SURGERY WITH YOUR REGULAR TOOTHPASTE   [facility]- Preparing For Surgery  Before surgery, you can play an important role. Because  skin is not sterile, your skin needs to be as free of germs as possible. You can reduce the number of germs on your skin by washing with CHG (chlorahexidine gluconate) Soap before surgery.  CHG is an antiseptic cleaner which kills germs and bonds with the skin to continue killing germs even after washing.     Please do not use if you have an allergy to CHG or antibacterial soaps. If your skin becomes reddened/irritated stop using the CHG.  Do not shave (including legs and underarms) for at least 48 hours prior to first CHG shower. It is OK to shave your face.  Please follow these instructions carefully.     Shower the NIGHT BEFORE SURGERY and the MORNING OF SURGERY with CHG Soap.   If you chose to wash your hair, wash your  hair first as usual with your normal shampoo. After you shampoo, rinse your hair and body thoroughly to remove the shampoo.  Then [employer] and genitals (private parts) with your normal soap and rinse thoroughly to remove soap.  After that Use CHG Soap as you would any other liquid soap. You can apply CHG directly to the skin and wash gently with a scrungie or a clean washcloth.   Apply the CHG Soap to your body ONLY FROM THE NECK DOWN.  Do not use on open wounds or open sores. Avoid contact with your eyes, ears, mouth and genitals (private parts). Wash Face and genitals (private parts)  with your normal soap.   Wash thoroughly, paying special attention to the area where your surgery will be performed.  Thoroughly rinse your body with warm water from the neck down.  DO NOT shower/wash with your normal soap after using and rinsing off the CHG Soap.  Pat yourself dry with a CLEAN TOWEL.  Wear CLEAN PAJAMAS to bed the night before surgery  Place CLEAN SHEETS on your bed the night before your surgery  DO NOT SLEEP WITH PETS.   Day of Surgery:  Take a shower with CHG soap. Wear Clean/Comfortable clothing the morning of surgery Do not apply any deodorants/lotions.    Remember to brush your teeth WITH YOUR REGULAR TOOTHPASTE.   Please read over the following fact sheets that you were given.

## 2021-10-10 ENCOUNTER — Inpatient Hospital Stay (HOSPITAL_COMMUNITY)
Admission: RE | Admit: 2021-10-10 | Discharge: 2021-10-10 | Disposition: A | Discharge status: Home / Self Care | Payer: Medicare Other | Source: Ambulatory Visit

## 2021-10-11 ENCOUNTER — Encounter (HOSPITAL_COMMUNITY): Payer: Self-pay

## 2021-10-11 ENCOUNTER — Other Ambulatory Visit: Payer: Self-pay

## 2021-10-11 ENCOUNTER — Encounter (HOSPITAL_COMMUNITY)
Admission: RE | Admit: 2021-10-11 | Discharge: 2021-10-11 | Disposition: A | Discharge status: Home / Self Care | Payer: Medicare Other | Source: Ambulatory Visit | Attending: Surgery | Admitting: Surgery

## 2021-10-11 DIAGNOSIS — Z7984 Long term (current) use of oral hypoglycemic drugs: Secondary | ICD-10-CM | POA: Diagnosis not present

## 2021-10-11 DIAGNOSIS — Z90710 Acquired absence of both cervix and uterus: Secondary | ICD-10-CM | POA: Insufficient documentation

## 2021-10-11 DIAGNOSIS — Z96653 Presence of artificial knee joint, bilateral: Secondary | ICD-10-CM | POA: Insufficient documentation

## 2021-10-11 DIAGNOSIS — G4733 Obstructive sleep apnea (adult) (pediatric): Secondary | ICD-10-CM | POA: Diagnosis not present

## 2021-10-11 DIAGNOSIS — E669 Obesity, unspecified: Secondary | ICD-10-CM | POA: Diagnosis not present

## 2021-10-11 DIAGNOSIS — Z8542 Personal history of malignant neoplasm of other parts of uterus: Secondary | ICD-10-CM | POA: Diagnosis not present

## 2021-10-11 DIAGNOSIS — K8080 Other cholelithiasis without obstruction: Secondary | ICD-10-CM | POA: Insufficient documentation

## 2021-10-11 DIAGNOSIS — E119 Type 2 diabetes mellitus without complications: Secondary | ICD-10-CM | POA: Diagnosis not present

## 2021-10-11 DIAGNOSIS — Z87891 Personal history of nicotine dependence: Secondary | ICD-10-CM | POA: Insufficient documentation

## 2021-10-11 DIAGNOSIS — Z01818 Encounter for other preprocedural examination: Secondary | ICD-10-CM | POA: Insufficient documentation

## 2021-10-11 DIAGNOSIS — E785 Hyperlipidemia, unspecified: Secondary | ICD-10-CM | POA: Insufficient documentation

## 2021-10-11 DIAGNOSIS — Z6831 Body mass index (BMI) 31.0-31.9, adult: Secondary | ICD-10-CM | POA: Diagnosis not present

## 2021-10-11 DIAGNOSIS — Z7982 Long term (current) use of aspirin: Secondary | ICD-10-CM | POA: Insufficient documentation

## 2021-10-11 DIAGNOSIS — Z79899 Other long term (current) drug therapy: Secondary | ICD-10-CM | POA: Insufficient documentation

## 2021-10-11 DIAGNOSIS — Z9989 Dependence on other enabling machines and devices: Secondary | ICD-10-CM | POA: Diagnosis not present

## 2021-10-11 DIAGNOSIS — I10 Essential (primary) hypertension: Secondary | ICD-10-CM | POA: Insufficient documentation

## 2021-10-11 LAB — COMPREHENSIVE METABOLIC PANEL
ALT: 25 U/L (ref 0–44)
AST: 24 U/L (ref 15–41)
Albumin: 4 g/dL (ref 3.5–5.0)
Alkaline Phosphatase: 70 U/L (ref 38–126)
Anion gap: 13 (ref 5–15)
BUN: 16 mg/dL (ref 8–23)
CO2: 26 mmol/L (ref 22–32)
Calcium: 9.7 mg/dL (ref 8.9–10.3)
Chloride: 98 mmol/L (ref 98–111)
Creatinine, Ser: 1.06 mg/dL — ABNORMAL HIGH (ref 0.44–1.00)
GFR, Estimated: 54 mL/min — ABNORMAL LOW (ref >60)
Glucose, Bld: 238 mg/dL — ABNORMAL HIGH (ref 70–99)
Potassium: 4 mmol/L (ref 3.5–5.1)
Sodium: 137 mmol/L (ref 135–145)
Total Bilirubin: 0.5 mg/dL (ref 0.3–1.2)
Total Protein: 6.7 g/dL (ref 6.5–8.1)

## 2021-10-11 LAB — CBC WITH DIFFERENTIAL/PLATELET
Abs Immature Granulocytes: 0.06 10*3/uL (ref 0.00–0.07)
Basophils Absolute: 0.1 10*3/uL (ref 0.0–0.1)
Basophils Relative: 1 %
Eosinophils Absolute: 0.8 10*3/uL — ABNORMAL HIGH (ref 0.0–0.5)
Eosinophils Relative: 8 %
HCT: 44.7 % (ref 36.0–46.0)
Hemoglobin: 14.8 g/dL (ref 12.0–15.0)
Immature Granulocytes: 1 %
Lymphocytes Relative: 25 %
Lymphs Abs: 2.6 10*3/uL (ref 0.7–4.0)
MCH: 28.8 pg (ref 26.0–34.0)
MCHC: 33.1 g/dL (ref 30.0–36.0)
MCV: 87.1 fL (ref 80.0–100.0)
Monocytes Absolute: 1 10*3/uL (ref 0.1–1.0)
Monocytes Relative: 10 %
Neutro Abs: 5.8 10*3/uL (ref 1.7–7.7)
Neutrophils Relative %: 55 %
Platelets: 290 10*3/uL (ref 150–400)
RBC: 5.13 MIL/uL — ABNORMAL HIGH (ref 3.87–5.11)
RDW: 13 % (ref 11.5–15.5)
WBC: 10.4 10*3/uL (ref 4.0–10.5)
nRBC: 0 % (ref 0.0–0.2)

## 2021-10-11 LAB — HEMOGLOBIN A1C
Hgb A1c MFr Bld: 8.1 % — ABNORMAL HIGH (ref 4.8–5.6)
Mean Plasma Glucose: 185.77 mg/dL

## 2021-10-11 LAB — GLUCOSE, CAPILLARY: Glucose-Capillary: 231 mg/dL — ABNORMAL HIGH (ref 70–99)

## 2021-10-11 NOTE — Progress Notes (Addendum)
PCP - Dr. Lockie Pares Cardiologist - denies  PPM/ICD - n/a Device Orders - n/a Rep Notified - n/a  Chest x-ray - n/a EKG - 10/11/21 Stress Test - denies ECHO - denies Cardiac Cath - denies  Sleep Study - Yes. Per patient sleep study was done many years ago in [place].  CPAP - Wears nightly  Fasting Blood Sugar - 231 Checks Blood Sugar once a month  Type 2 DM  Blood Thinner Instructions: n/a Aspirin Instructions: As of today stop taking Aspirin unless otherwise instructed by your surgeon. If you have not received instructions then you need to contact your surgeon's office for instructions.   ERAS Protcol - Yes PRE-SURGERY Ensure or G2- No  COVID TEST- Ambulatory Surgery   Anesthesia review: Yes  Patient denies shortness of breath, fever, cough and chest pain at PAT appointment   All instructions explained to the patient, with a verbal understanding of the material. Patient agrees to go over the instructions while at home for a better understanding. Patient also instructed to self quarantine after being tested for COVID-19. The opportunity to ask questions was provided.

## 2021-10-14 ENCOUNTER — Other Ambulatory Visit (HOSPITAL_COMMUNITY): Payer: Medicare Other

## 2021-10-14 NOTE — Anesthesia Preprocedure Evaluation (Addendum)
Anesthesia Evaluation  Patient identified by MRN, date of birth, ID band Patient awake    Reviewed: Allergy & Precautions, NPO status , Patient's Chart, lab work & pertinent test results  History of Anesthesia Complications Negative for: history of anesthetic complications  Airway Mallampati: II  TM Distance: >3 FB Neck ROM: Full    Dental  (+) Dental Advisory Given, Teeth Intact   Pulmonary sleep apnea and Continuous Positive Airway Pressure Ventilation , former smoker,    breath sounds clear to auscultation       Cardiovascular hypertension, Pt. on medications (-) angina Rhythm:Regular Rate:Normal     Neuro/Psych Depression negative neurological ROS     GI/Hepatic Neg liver ROS, neg GERD  ,Painful gallbladder/stones   Endo/Other  diabetes, Oral Hypoglycemic Agentsobese  Renal/GU negative Renal ROS     Musculoskeletal   Abdominal (+) + obese,   Peds  Hematology negative hematology ROS (+)   Anesthesia Other Findings   Reproductive/Obstetrics                            Anesthesia Physical Anesthesia Plan  ASA: 3  Anesthesia Plan: General   Post-op Pain Management:    Induction: Intravenous  PONV Risk Score and Plan: 3 and Ondansetron, Dexamethasone and Treatment may vary due to age or medical condition  Airway Management Planned: Oral ETT  Additional Equipment: None  Intra-op Plan:   Post-operative Plan: Extubation in OR  Informed Consent: I have reviewed the patients History and Physical, chart, labs and discussed the procedure including the risks, benefits and alternatives for the proposed anesthesia with the patient or authorized representative who has indicated his/her understanding and acceptance.     Dental advisory given  Plan Discussed with: CRNA and Surgeon  Anesthesia Plan Comments: (PAT note written 10/14/2021 by Myra Gianotti, PA-C. )        Anesthesia Quick Evaluation

## 2021-10-14 NOTE — Progress Notes (Signed)
Anesthesia Chart Review:  Case: [id number] Date/Time: 10/17/21 1400   Procedure: LAPAROSCOPIC CHOLECYSTECTOMY   Anesthesia type: General   Pre-op diagnosis: GALLSTONES   Location: [place] OR ROOM 04 / [place] OR   Surgeons: Erroll Luna, MD       DISCUSSION: Patient is a 75 year old female scheduled for the above procedure.  She had ED visit on 08/26/2021 for abdominal pain.  Also with intermittent nausea and vomiting.  Ultrasound showed cholelithiasis without evidence of cholecystitis or obstruction.  She was referred for outpatient general surgery consultation and cholecystectomy was recommended.  History includes former smoker, HTN, HLD, DM2, uterine cancer (s/p hysterectomy 1976), OSA (uses CPAP).  BMI is consistent with obesity.  She only checks her home CBGs approximately once a month.  Her A1c is down to 8.1% since April. She will get a CBG on arrival.  Anesthesia team to evaluate on the day of surgery.   VS: BP 123/63   Pulse 99   Temp 36.5 C   Ht 5' 5"  (1.651 m)   Wt 84.7 kg   SpO2 96%   BMI 31.07 kg/m   PROVIDERS: Billie Ruddy, MD is PCP    LABS: Preoperative labs noted. A1c 8.1%, down from 8.4% on 04/26/21.  A1c result routed to Dr. Brantley Stage.  (all labs ordered are listed, but only abnormal results are displayed)  Labs Reviewed  GLUCOSE, CAPILLARY - Abnormal; Notable for the following components:      Result Value   Glucose-Capillary 231 (*)    All other components within normal limits  CBC WITH DIFFERENTIAL/PLATELET - Abnormal; Notable for the following components:   RBC 5.13 (*)    Eosinophils Absolute 0.8 (*)    All other components within normal limits  COMPREHENSIVE METABOLIC PANEL - Abnormal; Notable for the following components:   Glucose, Bld 238 (*)    Creatinine, Ser 1.06 (*)    GFR, Estimated 54 (*)    All other components within normal limits  HEMOGLOBIN A1C - Abnormal; Notable for the following components:   Hgb A1c MFr Bld 8.1 (*)    All other  components within normal limits     IMAGES: [place] Abd limited 08/26/21: IMPRESSION: - Cholelithiasis with reported tenderness over the gallbladder. Findings may be indicative of early cholecystitis but are not associated with wall thickening or pericholecystic stranding. Correlation with HIDA scan may be helpful particularly if findings are equivocal on physical examination. - No dilation of the common bile duct. - Signs of liver disease with nodular heterogeneous liver, potentially with some background variable steatotic changes.    EKG: 10/11/21: NSR   CV: N/A  Past Medical History:  Diagnosis Date   Cancer ([place]) 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   Depression    Diabetes mellitus without complication ([place])    Diverticulosis    Hyperlipidemia    Hyperplastic colon polyp 11/2014   Hypertension    Sleep apnea     Past Surgical History:  Procedure Laterality Date   ABDOMINAL HYSTERECTOMY  1976   s/p uterine CA   BREAST EXCISIONAL BIOPSY Bilateral    BREAST SURGERY  1995   L breast node removal   BREAST SURGERY     R breast biopsy   EYE MUSCLE SURGERY  2019   EYE SURGERY  2017   bilateral cataract surgery   JOINT REPLACEMENT  2004   bilateral knee replacements   TONSILLECTOMY  1950    MEDICATIONS:  acetaminophen (TYLENOL) 500 MG tablet   Ascorbic Acid (  VITAMIN C) 1000 MG tablet   aspirin EC 81 MG tablet   atorvastatin (LIPITOR) 20 MG tablet   Blood Glucose Monitoring Suppl (ONE TOUCH ULTRA 2) w/Device KIT   buPROPion (WELLBUTRIN SR) 100 MG 12 hr tablet   Calcium Carb-Cholecalciferol (CALCIUM 600 + D PO)   cetirizine (ZYRTEC) 10 MG tablet   cholecalciferol (VITAMIN D3) 25 MCG (1000 UNIT) tablet   cycloSPORINE (RESTASIS) 0.05 % ophthalmic emulsion   ferrous sulfate 325 (65 FE) MG tablet   Lactobacillus (PROBIOTIC ACIDOPHILUS PO)   Multiple Vitamin (MULTIVITAMIN WITH MINERALS) TABS tablet   Multiple Vitamins-Minerals (HAIR/SKIN/NAILS) CAPS   ondansetron (ZOFRAN ODT) 4 MG  disintegrating tablet   ramipril (ALTACE) 10 MG capsule   Simethicone 80 MG TABS   SYNJARDY 12.04-999 MG TABS   verapamil (CALAN-SR) 180 MG CR tablet   No current facility-administered medications for this encounter.    Myra Gianotti, PA-C Surgical Short Stay/Anesthesiology [facility] Phone [phone] [facility] Phone [phone] 10/14/2021 4:05 PM

## 2021-10-17 ENCOUNTER — Ambulatory Visit (HOSPITAL_COMMUNITY): Payer: Medicare Other | Admitting: Vascular Surgery

## 2021-10-17 ENCOUNTER — Ambulatory Visit (HOSPITAL_COMMUNITY)
Admission: RE | Admit: 2021-10-17 | Discharge: 2021-10-17 | Disposition: A | Discharge status: Home / Self Care | Payer: Medicare Other | Attending: Surgery | Admitting: Surgery

## 2021-10-17 ENCOUNTER — Encounter (HOSPITAL_COMMUNITY): Payer: Self-pay | Admitting: Surgery

## 2021-10-17 ENCOUNTER — Ambulatory Visit (HOSPITAL_COMMUNITY): Payer: Medicare Other | Admitting: Anesthesiology

## 2021-10-17 ENCOUNTER — Encounter (HOSPITAL_COMMUNITY)
Admission: RE | Disposition: A | Discharge status: Home / Self Care | Payer: Self-pay | Source: Home / Self Care | Attending: Surgery

## 2021-10-17 ENCOUNTER — Other Ambulatory Visit: Payer: Self-pay

## 2021-10-17 DIAGNOSIS — K8018 Calculus of gallbladder with other cholecystitis without obstruction: Secondary | ICD-10-CM | POA: Diagnosis not present

## 2021-10-17 DIAGNOSIS — G4730 Sleep apnea, unspecified: Secondary | ICD-10-CM | POA: Diagnosis not present

## 2021-10-17 DIAGNOSIS — Z419 Encounter for procedure for purposes other than remedying health state, unspecified: Secondary | ICD-10-CM

## 2021-10-17 DIAGNOSIS — Z87891 Personal history of nicotine dependence: Secondary | ICD-10-CM | POA: Diagnosis not present

## 2021-10-17 DIAGNOSIS — I10 Essential (primary) hypertension: Secondary | ICD-10-CM | POA: Diagnosis not present

## 2021-10-17 DIAGNOSIS — Z6830 Body mass index (BMI) 30.0-30.9, adult: Secondary | ICD-10-CM | POA: Diagnosis not present

## 2021-10-17 DIAGNOSIS — Z885 Allergy status to narcotic agent status: Secondary | ICD-10-CM | POA: Insufficient documentation

## 2021-10-17 DIAGNOSIS — K8010 Calculus of gallbladder with chronic cholecystitis without obstruction: Secondary | ICD-10-CM | POA: Diagnosis not present

## 2021-10-17 DIAGNOSIS — E119 Type 2 diabetes mellitus without complications: Secondary | ICD-10-CM | POA: Insufficient documentation

## 2021-10-17 DIAGNOSIS — E669 Obesity, unspecified: Secondary | ICD-10-CM | POA: Diagnosis not present

## 2021-10-17 DIAGNOSIS — E785 Hyperlipidemia, unspecified: Secondary | ICD-10-CM | POA: Insufficient documentation

## 2021-10-17 DIAGNOSIS — Z7984 Long term (current) use of oral hypoglycemic drugs: Secondary | ICD-10-CM | POA: Insufficient documentation

## 2021-10-17 DIAGNOSIS — Z7982 Long term (current) use of aspirin: Secondary | ICD-10-CM | POA: Insufficient documentation

## 2021-10-17 DIAGNOSIS — K8064 Calculus of gallbladder and bile duct with chronic cholecystitis without obstruction: Secondary | ICD-10-CM | POA: Insufficient documentation

## 2021-10-17 DIAGNOSIS — F32A Depression, unspecified: Secondary | ICD-10-CM | POA: Diagnosis not present

## 2021-10-17 DIAGNOSIS — Z79899 Other long term (current) drug therapy: Secondary | ICD-10-CM | POA: Insufficient documentation

## 2021-10-17 HISTORY — PX: CHOLECYSTECTOMY: SHX55

## 2021-10-17 LAB — GLUCOSE, CAPILLARY
Glucose-Capillary: 155 mg/dL — ABNORMAL HIGH (ref 70–99)
Glucose-Capillary: 195 mg/dL — ABNORMAL HIGH (ref 70–99)

## 2021-10-17 SURGERY — LAPAROSCOPIC CHOLECYSTECTOMY
Anesthesia: General | Site: Abdomen

## 2021-10-17 MED ORDER — SUGAMMADEX SODIUM 200 MG/2ML IV SOLN
INTRAVENOUS | Status: DC | PRN
  Administered 2021-10-17: 200 mg via INTRAVENOUS

## 2021-10-17 MED ORDER — PROMETHAZINE HCL 25 MG/ML IJ SOLN: 6.2500 mg | INTRAMUSCULAR | Status: DC | PRN

## 2021-10-17 MED ORDER — BUPIVACAINE-EPINEPHRINE 0.25% -1:200000 IJ SOLN
INTRAMUSCULAR | Status: DC | PRN
  Administered 2021-10-17: 1 mL
  Administered 2021-10-17: 7 mL

## 2021-10-17 MED ORDER — HYDROMORPHONE HCL 1 MG/ML IJ SOLN
INTRAMUSCULAR | Status: AC
  Filled 2021-10-17: qty 1

## 2021-10-17 MED ORDER — ORAL CARE MOUTH RINSE: 15.0000 mL | Freq: Once | OROMUCOSAL | Status: AC

## 2021-10-17 MED ORDER — CEFAZOLIN SODIUM-DEXTROSE 2-4 GM/100ML-% IV SOLN
2.0000 g | INTRAVENOUS | Status: AC
  Administered 2021-10-17: 2 g via INTRAVENOUS

## 2021-10-17 MED ORDER — DEXAMETHASONE SODIUM PHOSPHATE 10 MG/ML IJ SOLN
INTRAMUSCULAR | Status: DC | PRN
  Administered 2021-10-17: 10 mg via INTRAVENOUS

## 2021-10-17 MED ORDER — ACETAMINOPHEN 500 MG PO TABS: 1000.0000 mg | ORAL_TABLET | ORAL | Status: AC

## 2021-10-17 MED ORDER — MEPERIDINE HCL 25 MG/ML IJ SOLN: 6.2500 mg | INTRAMUSCULAR | Status: DC | PRN

## 2021-10-17 MED ORDER — PROPOFOL 10 MG/ML IV BOLUS
INTRAVENOUS | Status: AC
  Filled 2021-10-17: qty 20

## 2021-10-17 MED ORDER — BUPIVACAINE-EPINEPHRINE (PF) 0.25% -1:200000 IJ SOLN
INTRAMUSCULAR | Status: AC
  Filled 2021-10-17: qty 30

## 2021-10-17 MED ORDER — CHLORHEXIDINE GLUCONATE CLOTH 2 % EX PADS: 6.0000 | MEDICATED_PAD | Freq: Once | CUTANEOUS | Status: DC

## 2021-10-17 MED ORDER — HYDRALAZINE HCL 20 MG/ML IJ SOLN
INTRAMUSCULAR | Status: AC
  Filled 2021-10-17: qty 1

## 2021-10-17 MED ORDER — ACETAMINOPHEN 500 MG PO TABS
ORAL_TABLET | ORAL | Status: AC
  Administered 2021-10-17: 1000 mg via ORAL
  Filled 2021-10-17: qty 2

## 2021-10-17 MED ORDER — IBUPROFEN 800 MG PO TABS: 800.0000 mg | ORAL_TABLET | Freq: Three times a day (TID) | ORAL | 0 refills | Status: DC | PRN

## 2021-10-17 MED ORDER — CHLORHEXIDINE GLUCONATE 0.12 % MT SOLN: 15.0000 mL | Freq: Once | OROMUCOSAL | Status: AC

## 2021-10-17 MED ORDER — CHLORHEXIDINE GLUCONATE 0.12 % MT SOLN
OROMUCOSAL | Status: AC
  Administered 2021-10-17: 15 mL via OROMUCOSAL
  Filled 2021-10-17: qty 15

## 2021-10-17 MED ORDER — OXYCODONE HCL 5 MG/5ML PO SOLN: 5.0000 mg | Freq: Once | ORAL | Status: DC | PRN

## 2021-10-17 MED ORDER — LACTATED RINGERS IV SOLN: INTRAVENOUS | Status: DC

## 2021-10-17 MED ORDER — MIDAZOLAM HCL 2 MG/2ML IJ SOLN: 0.5000 mg | Freq: Once | INTRAMUSCULAR | Status: DC | PRN

## 2021-10-17 MED ORDER — ROCURONIUM BROMIDE 10 MG/ML (PF) SYRINGE
PREFILLED_SYRINGE | INTRAVENOUS | Status: DC | PRN
  Administered 2021-10-17: 50 mg via INTRAVENOUS

## 2021-10-17 MED ORDER — OXYCODONE HCL 5 MG PO TABS: 5.0000 mg | ORAL_TABLET | Freq: Once | ORAL | Status: DC | PRN

## 2021-10-17 MED ORDER — PHENYLEPHRINE 40 MCG/ML (10ML) SYRINGE FOR IV PUSH (FOR BLOOD PRESSURE SUPPORT)
PREFILLED_SYRINGE | INTRAVENOUS | Status: DC | PRN
  Administered 2021-10-17: 80 ug via INTRAVENOUS

## 2021-10-17 MED ORDER — ONDANSETRON HCL 4 MG/2ML IJ SOLN
INTRAMUSCULAR | Status: DC | PRN
  Administered 2021-10-17: 4 mg via INTRAVENOUS

## 2021-10-17 MED ORDER — HYDRALAZINE HCL 20 MG/ML IJ SOLN
5.0000 mg | Freq: Once | INTRAMUSCULAR | Status: AC
  Administered 2021-10-17: 5 mg via INTRAVENOUS

## 2021-10-17 MED ORDER — FENTANYL CITRATE (PF) 250 MCG/5ML IJ SOLN
INTRAMUSCULAR | Status: DC | PRN
  Administered 2021-10-17 (×4): 50 ug via INTRAVENOUS

## 2021-10-17 MED ORDER — FENTANYL CITRATE (PF) 250 MCG/5ML IJ SOLN
INTRAMUSCULAR | Status: AC
  Filled 2021-10-17: qty 5

## 2021-10-17 MED ORDER — 0.9 % SODIUM CHLORIDE (POUR BTL) OPTIME
TOPICAL | Status: DC | PRN
  Administered 2021-10-17: 1000 mL

## 2021-10-17 MED ORDER — LABETALOL HCL 5 MG/ML IV SOLN
INTRAVENOUS | Status: DC | PRN
  Administered 2021-10-17 (×2): 5 mg via INTRAVENOUS

## 2021-10-17 MED ORDER — OXYCODONE HCL 5 MG PO TABS: 5.0000 mg | ORAL_TABLET | Freq: Four times a day (QID) | ORAL | 0 refills | Status: DC | PRN

## 2021-10-17 MED ORDER — PROPOFOL 10 MG/ML IV BOLUS
INTRAVENOUS | Status: DC | PRN
  Administered 2021-10-17: 100 mg via INTRAVENOUS
  Administered 2021-10-17 (×2): 50 mg via INTRAVENOUS

## 2021-10-17 MED ORDER — SODIUM CHLORIDE 0.9 % IR SOLN
Status: DC | PRN
  Administered 2021-10-17: 1000 mL

## 2021-10-17 MED ORDER — CEFAZOLIN SODIUM-DEXTROSE 2-4 GM/100ML-% IV SOLN
INTRAVENOUS | Status: AC
  Filled 2021-10-17: qty 100

## 2021-10-17 MED ORDER — HYDROMORPHONE HCL 1 MG/ML IJ SOLN
0.2500 mg | INTRAMUSCULAR | Status: DC | PRN
  Administered 2021-10-17: 0.5 mg via INTRAVENOUS

## 2021-10-17 MED ORDER — LIDOCAINE 2% (20 MG/ML) 5 ML SYRINGE
INTRAMUSCULAR | Status: DC | PRN
  Administered 2021-10-17: 20 mg via INTRAVENOUS

## 2021-10-17 SURGICAL SUPPLY — 38 items
APPLIER CLIP ROT 10 11.4 M/L (STAPLE) ×2
BAG COUNTER SPONGE SURGICOUNT (BAG) ×2 IMPLANT
BLADE CLIPPER SURG (BLADE) IMPLANT
CANISTER SUCT 3000ML PPV (MISCELLANEOUS) ×2 IMPLANT
CHLORAPREP W/TINT 26 (MISCELLANEOUS) ×2 IMPLANT
CLIP APPLIE ROT 10 11.4 M/L (STAPLE) ×1 IMPLANT
COVER SURGICAL LIGHT HANDLE (MISCELLANEOUS) ×2 IMPLANT
DERMABOND ADVANCED (GAUZE/BANDAGES/DRESSINGS) ×1
DERMABOND ADVANCED .7 DNX12 (GAUZE/BANDAGES/DRESSINGS) ×1 IMPLANT
ELECT REM PT RETURN 9FT ADLT (ELECTROSURGICAL) ×2
ELECTRODE REM PT RTRN 9FT ADLT (ELECTROSURGICAL) ×1 IMPLANT
GLOVE SRG 8 PF TXTR STRL LF DI (GLOVE) ×1 IMPLANT
GLOVE SURG ENC MOIS LTX SZ8 (GLOVE) ×2 IMPLANT
GLOVE SURG UNDER POLY LF SZ8 (GLOVE) ×1
GOWN STRL REUS W/ TWL LRG LVL3 (GOWN DISPOSABLE) ×2 IMPLANT
GOWN STRL REUS W/ TWL XL LVL3 (GOWN DISPOSABLE) ×1 IMPLANT
GOWN STRL REUS W/TWL LRG LVL3 (GOWN DISPOSABLE) ×2
GOWN STRL REUS W/TWL XL LVL3 (GOWN DISPOSABLE) ×1
KIT BASIN OR (CUSTOM PROCEDURE TRAY) ×2 IMPLANT
KIT TURNOVER KIT B (KITS) ×2 IMPLANT
NS IRRIG 1000ML POUR BTL (IV SOLUTION) ×2 IMPLANT
PAD ARMBOARD 7.5X6 YLW CONV (MISCELLANEOUS) ×2 IMPLANT
POUCH RETRIEVAL ECOSAC 10 (ENDOMECHANICALS) ×1 IMPLANT
POUCH RETRIEVAL ECOSAC 10MM (ENDOMECHANICALS) ×1
SCISSORS LAP 5X35 DISP (ENDOMECHANICALS) ×2 IMPLANT
SET IRRIG TUBING LAPAROSCOPIC (IRRIGATION / IRRIGATOR) ×2 IMPLANT
SET TUBE SMOKE EVAC HIGH FLOW (TUBING) ×2 IMPLANT
SLEEVE ENDOPATH XCEL 5M (ENDOMECHANICALS) ×2 IMPLANT
SPECIMEN JAR SMALL (MISCELLANEOUS) ×2 IMPLANT
SUT MNCRL AB 4-0 PS2 18 (SUTURE) ×2 IMPLANT
TOWEL GREEN STERILE (TOWEL DISPOSABLE) ×2 IMPLANT
TOWEL GREEN STERILE FF (TOWEL DISPOSABLE) ×2 IMPLANT
TRAY LAPAROSCOPIC MC (CUSTOM PROCEDURE TRAY) ×2 IMPLANT
TROCAR XCEL BLUNT TIP 100MML (ENDOMECHANICALS) ×2 IMPLANT
TROCAR XCEL NON-BLD 11X100MML (ENDOMECHANICALS) ×2 IMPLANT
TROCAR XCEL NON-BLD 5MMX100MML (ENDOMECHANICALS) ×2 IMPLANT
WARMER LAPAROSCOPE (MISCELLANEOUS) ×2 IMPLANT
WATER STERILE IRR 1000ML POUR (IV SOLUTION) ×2 IMPLANT

## 2021-10-17 NOTE — H&P (Signed)
Subjective   Chief Complaint: Gallbladder    History of Present Illness: Tara Guerrero is a 75 y.o. female who is seen today as an office consultation at the request of Dr. Reather Converse for evaluation of Gallbladder  .   Patient presented on 8/29 2022 to the emergency room with epigastric abdominal pain. She also had nausea and vomiting. She had a mild episode of this about 3 months ago but nothing since. She is unclear what she ate. She is paying more attention to what she eats now. The pain lasted a few hours. She underwent an ultrasound which showed gallstones without signs of cholecystitis. She was sent today to discuss options of management of her gallstones as well as her biliary colic. She is watching her diet which is helping her symptoms not be so severe.  Review of Systems: A complete review of systems was obtained from the patient. I have reviewed this information and discussed as appropriate with the patient. See HPI as well for other ROS.  Review of Systems  Constitutional: Negative for chills and fever.  HENT: Negative.  Eyes: Negative.  Respiratory: Negative.  Cardiovascular: Negative.  Gastrointestinal: Positive for abdominal pain. Negative for diarrhea and vomiting.  Genitourinary: Negative.  Musculoskeletal: Negative.  Skin: Negative.  Neurological: Negative.  Endo/Heme/Allergies: Negative.  Psychiatric/Behavioral: Negative.    Medical History: Past Medical History:  Diagnosis Date   Adhesive contact dermatitis   Arthritis  OA   Cervical cancer (CMS-HCC)   Diabetes mellitus type 2, uncomplicated (CMS-HCC)   Hyperlipidemia   Hypertension   Sleep apnea  nasal pillar-CPAP uses everynight   Patient Active Problem List  Diagnosis   Right foot pain   Fracture, toe   Sebaceous cyst of breast, right   Infected sebaceous cyst   Abscess of breast   Controlled type 2 diabetes mellitus without complication, without long-term current use of insulin (CMS-HCC)    Essential hypertension   Past Surgical History:  Procedure Laterality Date   BREAST EXCISIONAL BIOPSY Right 10/08/2004  RUPTURED EPIDERMAL INCLUSION CYST   cervical cancer   CORNEAL EYE SURGERY  SK with [facility] OD 04/01/17   EXTRACTION CATARACT EXTRACAPSULAR W/INSERTION INTRAOCULAR PROSTHESIS Right 07/28/2017  Procedure: Cataract Extraction with intraocular lens implantation, right eye; Surgeon: Moody Bruins, MD; Location: [place]; Service: Ophthalmology; Laterality: Right;   EXTRACTION CATARACT EXTRACAPSULAR W/INSERTION INTRAOCULAR PROSTHESIS Left 09/15/2017  Procedure: Cataract Extraction with intraocular lens implantation, left eye; Surgeon: Moody Bruins, MD; Location: [place]; Service: Ophthalmology; Laterality: Left;   HYSTERECTOMY 1976   INCISIONAL BIOPSY BREAST Left 02/06/2003  INTRADUCTAL PAPILLOMATOSIS   JOINT REPLACEMENT Bilateral 2004  knees   OPEN EXCISION BREAST LESION Right 11/15/2015  Procedure: EXCISION OF SEBACEOUS CYST RIGHT BREAST; Surgeon: Jeananne Rama, MD; Location: [place]; Service: General Surgery; Laterality: Right;    Allergies  Allergen Reactions   Codeine Other (See Comments)  Other Reaction: Other reaction   Current Outpatient Medications on File Prior to Visit  Medication Sig Dispense Refill   aspirin 81 MG EC tablet Take 81 mg by mouth every morning. PATIENT DENIES HEART STENT OR STROKE.   atorvastatin (LIPITOR) 20 MG tablet Take 20 mg by mouth every evening.    BUPROPION HCL ORAL Take 150 mg by mouth every morning.    FLUZONE HIGH-DOSE 2016-17, PF, 180 mcg/0.5 mL Syrg IM syringe ADM 0.5ML IM UTD 0   hydrochlorothiazide (HYDRODIURIL) 25 MG tablet Take 25 mg by mouth every morning.    ketorolac (ACULAR LS) 0.4 %  ophthalmic solution Place 1 drop into the right eye 4 (four) times daily. 5 mL 6   ofloxacin (OCUFLOX) 0.3 % ophthalmic solution INSTILL 1 DROP IN RIGHT EYE FOUR TIMES DAILY 5 mL 0   RAMIPRIL ORAL Take 10 mg by mouth  every morning.    SYNJARDY XR 12.5-1,000 mg TBph Take 1 tablet by mouth 2 (two) times daily. 2   VERAPAMIL HCL (VERAPAMIL ORAL) Take 180 mg by mouth every evening.    No current facility-administered medications on file prior to visit.   Family History  Problem Relation Age of Onset   Breast cancer Mother 25   Stroke Mother   Breast cancer Sister 30   Stroke Sister   Breast cancer Maternal Aunt 75   Breast cancer Cousin 88   Breast cancer Other 26   Breast cancer Sister 86   Glaucoma Neg Hx   Macular degeneration Neg Hx    Social History   Tobacco Use  Smoking Status Former Smoker   Years: 1.00   Types: Cigarettes   Quit date: 12/29/1968   Years since quitting: 52.7  Smokeless Tobacco Never Used    Social History   Socioeconomic History   Marital status: Single  Tobacco Use   Smoking status: Former Smoker  Years: 1.00  Types: Cigarettes  Quit date: 12/29/1968  Years since quitting: 52.7   Smokeless tobacco: Never Used  Vaping Use   Vaping Use: Never used  Substance and Sexual Activity   Alcohol use: No   Drug use: No   Objective:   Vitals:  09/16/21 1455  BP: (!) 140/60  Pulse: 108  Temp: 36.7 C (98.1 F)  SpO2: 98%  Weight: 83.6 kg (184 lb 6.4 oz)  Height: 165.1 cm (5\' 5" )   Body mass index is 30.69 kg/m.  HEENT: No scleral icterus extraocular movements are intact  Neck: Soft nontender trachea midline  Pulmonary: Lung sounds are clear cardiovascular: Regular rate and rhythm  Abdomen: Soft nontender without rebound guarding previous scar from hysterectomy noted  Extremities no clubbing cyanosis nor edema  Labs, Imaging and Diagnostic Testing: CLINICAL DATA: RIGHT upper quadrant pain in a 75 year old female.   EXAM: ULTRASOUND ABDOMEN LIMITED RIGHT UPPER QUADRANT   COMPARISON: None aside from abdominal plain film of the same date.   FINDINGS: Gallbladder:   Cholelithiasis without gallbladder wall thickening or pericholecystic fluid.  Reported tenderness noted by the sonographer over the gallbladder. Largest calculus 1.3 cm.   Common bile duct:   Diameter: 4.7 mm.   Liver:   Heterogeneous echotexture and nodular contour of the liver. Portal vein is patent on color Doppler imaging with normal direction of blood flow towards the liver.   Other: None   IMPRESSION: Cholelithiasis with reported tenderness over the gallbladder. Findings may be indicative of early cholecystitis but are not associated with wall thickening or pericholecystic stranding. Correlation with HIDA scan may be helpful particularly if findings are equivocal on physical examination.   No dilation of the common bile duct.   Signs of liver disease with nodular heterogeneous liver, potentially with some background variable steatotic changes.     Electronically Signed By: Zetta Bills M.D. On: 08/26/2021 14:05  Assessment and Plan:  Diagnoses and all orders for this visit:  Calculus of gallbladder with chronic cholecystitis without obstruction    Pros and cons of surgical management of gallbladder disease discussed. Pathophysiology of gallstones and the anatomy of the biliary tract discussed. Medical and surgical options discussed with success rates and  long-term outcomes as well as expectations. She is opted for laparoscopic cholecystectomy with possible cholangiogram. Risks and benefits of surgery discussed. Risk of bleeding, infection, common bile duct injury, organ injury, blood vessel injury, injury to neighboring structures, death, DVT, myocardial event, she will vascular him, the need for open surgery and/or additional procedures discussed. The role of cholangiography discussed the pros and cons of as well as potential ERCP if necessary. She agrees to proceed  No follow-ups on file.  Kennieth Francois, MD   Total time 45 minutes face to face, documentation, reviewing chart, records

## 2021-10-17 NOTE — Op Note (Signed)
Laparoscopic Cholecystectomy  Procedure Note  Indications: This patient presents with symptomatic gallbladder disease and will undergo laparoscopic cholecystectomy.The procedure has been discussed with the patient. Operative and non operative treatments have been discussed. Risks of surgery include bleeding, infection,  Common bile duct injury,  Injury to the stomach,liver, colon,small intestine, abdominal wall,  Diaphragm,  Major blood vessels,  And the need for an open procedure.  Other risks include worsening of medical problems, death,  DVT and pulmonary embolism, and cardiovascular events.   Medical options have also been discussed. The patient has been informed of long term expectations of surgery and non surgical options,  The patient agrees to proceed.     Pre-operative Diagnosis: Calculus of gallbladder with other cholecystitis, without mention of obstruction  Post-operative Diagnosis: Same  Surgeon: Turner Daniels MD    Anesthesia: General endotracheal anesthesia and Local anesthesia 0.25.% bupivacaine  ASA Class: 2  Procedure Details  The patient was seen again in the Holding Room. The risks, benefits, complications, treatment options, and expected outcomes were discussed with the patient. The possibilities of reaction to medication, pulmonary aspiration, perforation of viscus, bleeding, recurrent infection, finding a normal gallbladder, the need for additional procedures, failure to diagnose a condition, the possible need to convert to an open procedure, and creating a complication requiring transfusion or operation were discussed with the patient. The patient and/or family concurred with the proposed plan, giving informed consent. The site of surgery properly noted/marked. The patient was taken to Operating Room, identified as Tara Guerrero and the procedure verified as Laparoscopic Cholecystectomy with Intraoperative Cholangiograms. A Time Out was held and the above information  confirmed.  Prior to the induction of general anesthesia, antibiotic prophylaxis was administered. General endotracheal anesthesia was then administered and tolerated well. After the induction, the abdomen was prepped in the usual sterile fashion. The patient was positioned in the supine position with the left arm comfortably tucked, along with some reverse Trendelenburg.  Local anesthetic agent was injected into the skin near the umbilicus and an incision made. The midline fascia was incised and the Hasson technique was used to introduce a 12 mm port under direct vision. It was secured with a figure of eight Vicryl suture placed in the usual fashion. Pneumoperitoneum was then created with CO2 and tolerated well without any adverse changes in the patient's vital signs. Additional trocars were introduced under direct vision with an 11 mm trocar in the epigastrium and 2 5 mm trocars in the right upper quadrant. All skin incisions were infiltrated with a local anesthetic agent before making the incision and placing the trocars.   The gallbladder was identified, the fundus grasped and retracted cephalad. Adhesions were lysed bluntly and with the electrocautery where indicated, taking care not to injure any adjacent organs or viscus. The infundibulum was grasped and retracted laterally, exposing the peritoneum overlying the triangle of Calot. This was then divided and exposed in a blunt fashion. The cystic duct was clearly identified and bluntly dissected circumferentially. The junctions of the gallbladder, cystic duct and common bile duct were clearly identified prior to the division of any linear structure.   The cystic duct was extremely small and the critical view was obtained.  A cholangiogram was not performed . Her LFT s and U/S showed no evidence of choledocolithiasis.   The cystic duct was then  ligated with surgical clips  on the patient side and  clipped on the gallbladder side and divided. The  cystic artery was identified,  dissected free, ligated with clips and divided as well. Posterior cystic artery clipped and divided.  The gallbladder was dissected from the liver bed in retrograde fashion with the electrocautery. The gallbladder was removed. The liver bed was irrigated and inspected. Hemostasis was achieved with the electrocautery. Copious irrigation was utilized and was repeatedly aspirated until clear all particulate matter. Hemostasis was achieved with no signs  Of bleeding or bile leakage.  The umbilical port site was closed with 0 vicryl.   Pneumoperitoneum was completely reduced after viewing removal of the trocars under direct vision. The wound was thoroughly irrigated and the fascia was then closed with a figure of eight suture; the skin was then closed with 4 O MONOCRYL  and a sterile dressing of dermabond  was applied.  Instrument, sponge, and needle counts were correct at closure and at the conclusion of the case.   Findings:  Cholelithiasis and chronic cholecystitis  Estimated Blood Loss: less than 50 mL         Drains: none         Total IV Fluids: per OR record          Specimens: Gallbladder           Complications: None; patient tolerated the procedure well.         Disposition: PACU - hemodynamically stable.         Condition: stable

## 2021-10-17 NOTE — Discharge Instructions (Addendum)
[facility] ______[facility] LAPAROSCOPIC SURGERY: POST OP INSTRUCTIONS Always review your discharge instruction sheet given to you by the facility where your surgery was performed. IF YOU HAVE DISABILITY OR FAMILY LEAVE FORMS, YOU MUST BRING THEM TO THE OFFICE FOR PROCESSING.   DO NOT GIVE THEM TO YOUR DOCTOR.  A prescription for pain medication may be given to you upon discharge.  Take your pain medication as prescribed, if needed.  If narcotic pain medicine is not needed, then you may take acetaminophen (Tylenol) or ibuprofen (Advil) as needed. Take your usually prescribed medications unless otherwise directed. If you need a refill on your pain medication, please contact your pharmacy.  They will contact our office to request authorization. Prescriptions will not be filled after 5pm or on week-ends. You should follow a light diet the first few days after arrival home, such as soup and crackers, etc.  Be sure to include lots of fluids daily. Most patients will experience some swelling and bruising in the area of the incisions.  Ice packs will help.  Swelling and bruising can take several days to resolve.  It is common to experience some constipation if taking pain medication after surgery.  Increasing fluid intake and taking a stool softener (such as Colace) will usually help or prevent this problem from occurring.  A mild laxative (Milk of Magnesia or Miralax) should be taken according to package instructions if there are no bowel movements after 48 hours. Unless discharge instructions indicate otherwise, you may remove your bandages 24-48 hours after surgery, and you may shower at that time.  You may have steri-strips (small skin tapes) in place directly over the incision.  These strips should be left on the skin for 7-10 days.  If your surgeon used skin glue on the incision, you may shower in 24 hours.  The glue will flake off over the next 2-3 weeks.  Any sutures or staples will be  removed at the office during your follow-up visit. ACTIVITIES:  You may resume regular (light) daily activities beginning the next day--such as daily self-care, walking, climbing stairs--gradually increasing activities as tolerated.  You may have sexual intercourse when it is comfortable.  Refrain from any heavy lifting or straining until approved by your doctor. You may drive when you are no longer taking prescription pain medication, you can comfortably wear a seatbelt, and you can safely maneuver your car and apply brakes. RETURN TO WORK:  __________________________________________________________ You should see your doctor in the office for a follow-up appointment approximately 2-3 weeks after your surgery.  Make sure that you call for this appointment within a day or two after you arrive home to insure a convenient appointment time. OTHER INSTRUCTIONS: __________________________________________________________________________________________________________________________ __________________________________________________________________________________________________________________________ WHEN TO CALL YOUR DOCTOR: Fever over 101.0 Inability to urinate Continued bleeding from incision. Increased pain, redness, or drainage from the incision. Increasing abdominal pain  The clinic staff is available to answer your questions during regular business hours.  Please don't hesitate to call and ask to speak to one of the nurses for clinical concerns.  If you have a medical emergency, go to the nearest emergency room or call 911.  A surgeon from [facility] is always on call at the hospital. [address], [address], [place], [place]  [address] ? [address] [address], , [place]   [address] [phone] ? [phone] ? FAX [phone] Web site: [email or web address]  

## 2021-10-17 NOTE — Anesthesia Postprocedure Evaluation (Signed)
Anesthesia Post Note  Patient: PEARLENA OW  Procedure(s) Performed: LAPAROSCOPIC CHOLECYSTECTOMY (Abdomen)     Patient location during evaluation: PACU Anesthesia Type: General Level of consciousness: awake and alert Pain management: pain level controlled Vital Signs Assessment: post-procedure vital signs reviewed and stable Respiratory status: spontaneous breathing, nonlabored ventilation and respiratory function stable Cardiovascular status: blood pressure returned to baseline and stable Postop Assessment: no apparent nausea or vomiting Anesthetic complications: no   No notable events documented.  Last Vitals:  Vitals:   10/17/21 1551 10/17/21 1606  BP: (!) 175/83 (!) 174/69  Pulse: 76 74  Resp: 11 13  Temp:    SpO2: 96% 95%    Last Pain:  Vitals:   10/17/21 1606  TempSrc:   PainSc: 3                  Judine Arciniega,W. EDMOND

## 2021-10-17 NOTE — Interval H&P Note (Signed)
History and Physical Interval Note:  10/17/2021 1:44 PM  Tara Guerrero  has presented today for surgery, with the diagnosis of GALLSTONES.  The various methods of treatment have been discussed with the patient and family. After consideration of risks, benefits and other options for treatment, the patient has consented to  Procedure(s): LAPAROSCOPIC CHOLECYSTECTOMY (N/A) as a surgical intervention.  The patient's history has been reviewed, patient examined, no change in status, stable for surgery.  I have reviewed the patient's chart and labs.  Questions were answered to the patient's satisfaction.     [place]

## 2021-10-17 NOTE — Transfer of Care (Signed)
Immediate Anesthesia Transfer of Care Note  Patient: Tara Guerrero  Procedure(s) Performed: LAPAROSCOPIC CHOLECYSTECTOMY (Abdomen)  Patient Location: PACU  Anesthesia Type:General  Level of Consciousness: awake, alert  and oriented  Airway & Oxygen Therapy: Patient Spontanous Breathing  Post-op Assessment: Report given to RN and Post -op Vital signs reviewed and stable  Post vital signs: Reviewed and stable  Last Vitals:  Vitals Value Taken Time  BP 182/90 10/17/21 1521  Temp    Pulse 71 10/17/21 1525  Resp 12 10/17/21 1525  SpO2 100 % 10/17/21 1525  Vitals shown include unvalidated device data.  Last Pain:  Vitals:   10/17/21 1243  TempSrc:   PainSc: 8          Complications: No notable events documented.

## 2021-10-17 NOTE — Anesthesia Procedure Notes (Signed)
Procedure Name: Intubation Date/Time: 10/17/2021 2:17 PM Performed by: Dorthea Cove, CRNA Pre-anesthesia Checklist: Patient identified, Emergency Drugs available, Suction available and Patient being monitored Patient Re-evaluated:Patient Re-evaluated prior to induction Oxygen Delivery Method: Circle system utilized Preoxygenation: Pre-oxygenation with 100% oxygen Induction Type: IV induction Ventilation: Mask ventilation without difficulty Laryngoscope Size: Mac and 3 Grade View: Grade II Tube type: Oral Tube size: 7.0 mm Number of attempts: 1 Airway Equipment and Method: Stylet and Oral airway Placement Confirmation: ETT inserted through vocal cords under direct vision, positive ETCO2 and breath sounds checked- equal and bilateral Secured at: 22 cm Tube secured with: Tape Dental Injury: Teeth and Oropharynx as per pre-operative assessment  Comments: Attempt x1 by CRNA with MAC 3 grade II view but unable to pass tube. Blade removed and mask ventilated. MD attempt x1 grade II view with MAC 3 successfully passed tube.

## 2021-10-18 ENCOUNTER — Encounter (HOSPITAL_COMMUNITY): Payer: Self-pay | Admitting: Surgery

## 2021-10-21 LAB — SURGICAL PATHOLOGY

## 2021-10-29 DIAGNOSIS — D2372 Other benign neoplasm of skin of left lower limb, including hip: Secondary | ICD-10-CM | POA: Diagnosis not present

## 2021-10-29 DIAGNOSIS — L821 Other seborrheic keratosis: Secondary | ICD-10-CM | POA: Diagnosis not present

## 2021-10-29 DIAGNOSIS — L570 Actinic keratosis: Secondary | ICD-10-CM | POA: Diagnosis not present

## 2021-10-29 DIAGNOSIS — D2271 Melanocytic nevi of right lower limb, including hip: Secondary | ICD-10-CM | POA: Diagnosis not present

## 2021-10-29 DIAGNOSIS — L814 Other melanin hyperpigmentation: Secondary | ICD-10-CM | POA: Diagnosis not present

## 2021-10-29 DIAGNOSIS — D485 Neoplasm of uncertain behavior of skin: Secondary | ICD-10-CM | POA: Diagnosis not present

## 2021-10-29 DIAGNOSIS — D225 Melanocytic nevi of trunk: Secondary | ICD-10-CM | POA: Diagnosis not present

## 2021-10-29 DIAGNOSIS — D1801 Hemangioma of skin and subcutaneous tissue: Secondary | ICD-10-CM | POA: Diagnosis not present

## 2021-11-26 DIAGNOSIS — G4733 Obstructive sleep apnea (adult) (pediatric): Secondary | ICD-10-CM | POA: Diagnosis not present

## 2021-12-31 DIAGNOSIS — G4733 Obstructive sleep apnea (adult) (pediatric): Secondary | ICD-10-CM | POA: Diagnosis not present

## 2022-02-04 DIAGNOSIS — L814 Other melanin hyperpigmentation: Secondary | ICD-10-CM | POA: Diagnosis not present

## 2022-02-04 DIAGNOSIS — L570 Actinic keratosis: Secondary | ICD-10-CM | POA: Diagnosis not present

## 2022-02-04 DIAGNOSIS — D2372 Other benign neoplasm of skin of left lower limb, including hip: Secondary | ICD-10-CM | POA: Diagnosis not present

## 2022-02-04 DIAGNOSIS — L738 Other specified follicular disorders: Secondary | ICD-10-CM | POA: Diagnosis not present

## 2022-02-04 DIAGNOSIS — Z85820 Personal history of malignant melanoma of skin: Secondary | ICD-10-CM | POA: Diagnosis not present

## 2022-02-04 DIAGNOSIS — Z85828 Personal history of other malignant neoplasm of skin: Secondary | ICD-10-CM | POA: Diagnosis not present

## 2022-02-05 ENCOUNTER — Ambulatory Visit (INDEPENDENT_AMBULATORY_CARE_PROVIDER_SITE_OTHER): Payer: Medicare Other

## 2022-02-05 ENCOUNTER — Ambulatory Visit: Payer: Medicare Other

## 2022-02-05 VITALS — BP 113/70 | HR 75 | Temp 98.2°F | Ht 65.0 in | Wt 188.0 lb

## 2022-02-05 DIAGNOSIS — Z0000 Encounter for general adult medical examination without abnormal findings: Secondary | ICD-10-CM | POA: Diagnosis not present

## 2022-02-05 NOTE — Progress Notes (Signed)
Subjective:   Tara Guerrero is a 76 y.o. female who presents for Medicare Annual (Subsequent) preventive examination.  Review of Systems    Virtual Visit via Telephone Note  I connected with  Tara Guerrero on 02/05/22 at 11:15 AM EST by telephone and verified that I am speaking with the correct person using two identifiers.  Location: Patient: Home Provider: Office Persons participating in the virtual visit: patient/Nurse Health Advisor   I discussed the limitations, risks, security and privacy concerns of performing an evaluation and management service by telephone and the availability of in person appointments. The patient expressed understanding and agreed to proceed.  Interactive audio and video telecommunications were attempted between this nurse and patient, however failed, due to patient having technical difficulties OR patient did not have access to video capability.  We continued and completed visit with audio only.  Some vital signs may be absent or patient reported.   Criselda Peaches, LPN  Cardiac Risk Factors include: advanced age (>70mn, >>72women);diabetes mellitus;hypertension     Objective:    Today's Vitals   02/05/22 1115 02/05/22 1117  BP: 113/70   Pulse: 75   Temp: 98.2 F (36.8 C)   Weight: 188 lb (85.3 kg)   Height: _0  (1.651 m)   PainSc:  7    Body mass index is 31.28 kg/m.  Advanced Directives 02/05/2022 10/17/2021 10/11/2021 08/26/2021 01/30/2021 01/21/2021 01/26/2020  Does Patient Have a Medical Advance Directive? Yes Yes No No Yes Yes No  Type of AParamed[place]of [place] - - [place] - -  Does patient want to make changes to medical advance directive? No - Patient declined No - Patient declined - - - No - Patient declined -  Copy of [place] Chart? Yes - validated most recent copy scanned in chart (See row information)  Yes - validated most recent copy scanned in chart (See row information) - - No - copy requested - -  Would patient like information on creating a medical advance directive? - - Yes (MAU/Ambulatory/Procedural Areas - Information given) No - Patient declined - - Yes (MAU/Ambulatory/Procedural Areas - Information given)    Current Medications (verified) Outpatient Encounter Medications as of 02/05/2022  Medication Sig   acetaminophen (TYLENOL) 500 MG tablet Take 500 mg by mouth every 6 (six) hours as needed for moderate pain.   Ascorbic Acid (VITAMIN C) 1000 MG tablet Take 1,000 mg by mouth 2 (two) times a week.   aspirin EC 81 MG tablet Take 81 mg by mouth daily.   atorvastatin (LIPITOR) 20 MG tablet TAKE 1 TABLET BY MOUTH  DAILY (Patient taking differently: Take 20 mg by mouth daily.)   Blood Glucose Monitoring Suppl (ONE TOUCH ULTRA 2) w/Device KIT Use to Check Blood glucose daily.( Dx:Z51.81)   buPROPion (WELLBUTRIN SR) 100 MG 12 hr tablet TAKE 1 TABLET BY MOUTH  DAILY   Calcium Carb-Cholecalciferol (CALCIUM 600 + D PO) Take 1 tablet by mouth 2 (two) times a week.   cetirizine (ZYRTEC) 10 MG tablet Take 10 mg by mouth daily.   cholecalciferol (VITAMIN D3) 25 MCG (1000 UNIT) tablet Take 1,000 Units by mouth 2 (two) times a week.   cycloSPORINE (RESTASIS) 0.05 % ophthalmic emulsion Place 1 drop into both eyes daily.   ferrous sulfate 325 (65 FE) MG tablet Take 325 mg by mouth 2 (two) times a week.   ibuprofen (  ADVIL) 800 MG tablet Take 1 tablet (800 mg total) by mouth every 8 (eight) hours as needed.   Lactobacillus (PROBIOTIC ACIDOPHILUS PO) Take 1 tablet by mouth daily.   Multiple Vitamin (MULTIVITAMIN WITH MINERALS) TABS tablet Take 1 tablet by mouth 2 (two) times a week.   Multiple Vitamins-Minerals (HAIR/SKIN/NAILS) CAPS Take 1 tablet by mouth 2 (two) times a week.   oxyCODONE (OXY IR/ROXICODONE) 5 MG immediate release tablet Take 1 tablet (5 mg total) by mouth every 6 (six) hours as needed  for severe pain.   ramipril (ALTACE) 10 MG capsule TAKE 1 CAPSULE BY MOUTH  DAILY   SYNJARDY 12.04-999 MG TABS TAKE 1 TABLET BY MOUTH  TWICE DAILY   verapamil (CALAN-SR) 180 MG CR tablet TAKE 1 TABLET BY MOUTH AT  BEDTIME   No facility-administered encounter medications on file as of 02/05/2022.    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   Depression    Diabetes mellitus without complication ([place])    Diverticulosis    Hyperlipidemia    Hyperplastic colon polyp 11/2014   Hypertension    Sleep apnea    Past Surgical History:  Procedure Laterality Date   ABDOMINAL HYSTERECTOMY  1976   s/p uterine CA   BREAST EXCISIONAL BIOPSY Bilateral    BREAST SURGERY  1995   L breast node removal   BREAST SURGERY     R breast biopsy   CHOLECYSTECTOMY N/A 10/17/2021   Procedure: LAPAROSCOPIC CHOLECYSTECTOMY;  Surgeon: Erroll Luna, MD;  Location: [facility];  Service: General;  Laterality: N/A;   EYE MUSCLE SURGERY  2019   EYE SURGERY  2017   bilateral cataract surgery   [facility] REPLACEMENT  2004   bilateral knee replacements   TONSILLECTOMY  1950   Family History  Problem Relation Age of Onset   Cancer Mother    Hyperlipidemia Mother    Hypertension Mother    Stroke Mother    Breast cancer Mother        diagnosed in her 43's   Alcohol abuse Father    Cancer Sister    Hyperlipidemia Sister    Hypertension Sister    Stroke Sister    Breast cancer Sister        diagnosed in her 56's   Mental illness Maternal Grandmother    Hypertension Maternal Grandmother    Hyperlipidemia Maternal Grandmother    Mental retardation Maternal Grandmother    Hearing loss Maternal Grandfather    Stroke Maternal Grandfather    Alcohol abuse Paternal Grandmother    Alcohol abuse Paternal Grandfather    Breast cancer Cousin        diagnosed in her 34's   Breast cancer Sister 47   Breast cancer Other         diagnosed late 20's   Social History   Socioeconomic History   Marital status: Single    Spouse name: Not on file   Number of children: 1   Years of education: 14   Highest education level: Associate degree: occupational, Hotel manager, or vocational program  Occupational History   Occupation: retired  Tobacco Use   Smoking status: Former   Smokeless tobacco: Never  Scientific laboratory technician Use: Never used  Substance and Sexual Activity   Alcohol use: Yes    Alcohol/week: 1.0 standard drink    Types: 1 Glasses of wine per  week    Comment: Social   Drug use: Never   Sexual activity: Not Currently  Other Topics Concern   Not on file  Social History Narrative   01/24/2019: Lives with granddaughter and dog.    Retired    Scientist, physiological Strain: Low Risk    Difficulty of Paying Living Expenses: Not hard at all  Food Insecurity: No Food Insecurity   Worried About Charity fundraiser in the Last Year: Never true   Arboriculturist in the Last Year: Never true  Transportation Needs: No Transportation Needs   Lack of Transportation (Medical): No   Lack of Transportation (Non-Medical): No  Physical Activity: Sufficiently Active   Days of Exercise per Week: 7 days   Minutes of Exercise per Session: 30 min  Stress: No Stress Concern Present   Feeling of Stress : Not at all  Social Connections: Moderately Isolated   Frequency of Communication with Friends and Family: More than three times a week   Frequency of Social Gatherings with Friends and Family: More than three times a week   Attends Religious Services: Never   Marine scientist or Organizations: Yes   Attends Archivist Meetings: Never   Marital Status: Divorced     Clinical Intake: Pre-visit preparation completed: Yes  Diabetic? Yes  Interpreter Needed?: NoNutrition Risk Assessment:  Has the patient had any N/V/D within the last 2 months?  No  Does the patient have any  non-healing wounds?  No  Has the patient had any unintentional weight loss or weight gain?  No   Diabetes:  Is the patient diabetic?  Yes  If diabetic, was a CBG obtained today?  Yes CBG 171 Did the patient bring in their glucometer from home?  No  Audio Visit How often do you monitor your CBG's? Daily.   Financial Strains and Diabetes Management:  Are you having any financial strains with the device, your supplies or your medication? No .  Does the patient want to be seen by Chronic Care Management for management of their diabetes?  No  Would the patient like to be referred to a Nutritionist or for Diabetic Management?  No   Diabetic Exams:  Diabetic Eye Exam: Completed yes. Overdue for diabetic eye exam. Pt has been advised about the importance in completing this exam. A referral has been placed today. Message sent to referral coordinator for scheduling purposes. Advised pt to expect a call from office referred to regarding appt.  Diabetic Foot Exam: Completed Yes. Pt has been advised about the importance in completing this exam. Pt is scheduled for diabetic foot exam on Followed by PCP.    Information entered by :: Rolene Arbour LPN   Activities of Daily Living In your present state of health, do you have any difficulty performing the following activities: 02/05/2022 10/11/2021  Hearing? N Y  Comment - Has bilateal hearing aids  Vision? N N  Difficulty concentrating or making decisions? N N  Walking or climbing stairs? N N  Dressing or bathing? N N  Doing errands, shopping? N N  Preparing Food and eating ? N -  Using the Toilet? N -  In the past six months, have you accidently leaked urine? Y -  Comment Wears pad -  Do you have problems with loss of bowel control? N -  Managing your Medications? N -  Managing your Finances? N -  Housekeeping or managing your  Housekeeping? N -  Some recent data might be hidden    Patient Care Team: Billie Ruddy, MD as PCP - General  (Family Medicine)  Indicate any recent Medical Services you may have received from other than [facility] providers in the past year (date may be approximate).     Assessment:   This is a routine wellness examination for Tara Guerrero.  Hearing/Vision screen Hearing Screening - Comments:: Wears hearing aids Vision Screening - Comments:: Wears glasses.Followed by [facility]  Dietary issues and exercise activities discussed: Current Exercise Habits: Home exercise routine, Type of exercise: walking, Time (Minutes): 40, Frequency (Times/Week): 7, Weekly Exercise (Minutes/Week): 280, Intensity: Moderate, Exercise limited by: None identified   Goals Addressed             This Visit's Progress    Patient Stated       Avoid covid.       Depression Screen PHQ 2/9 Scores 02/05/2022 01/30/2021 01/26/2020 01/24/2019  PHQ - 2 Score 0 0 0 -  Exception Documentation - - - Patient refusal    Fall Risk Fall Risk  02/05/2022 01/30/2021 01/26/2020 01/24/2019  Falls in the past year? 0 1 0 0  Number falls in past yr: 0 1 - -  Injury with Fall? 0 1 - -  Comment - bruised arm when walking dog and fell, fell in bedroom off stool - -  Risk for fall due to : No Fall Risks Impaired vision;History of fall(s) Medication side effect -  Follow up - Falls prevention discussed Falls evaluation completed;Education provided;Falls prevention discussed -    FALL RISK PREVENTION PERTAINING TO THE HOME:  Any stairs in or around the home? Yes  If so, are there any without handrails? Yes  Home free of loose throw rugs in walkways, pet beds, electrical cords, etc? Yes  Adequate lighting in your home to reduce risk of falls? Yes   ASSISTIVE DEVICES UTILIZED TO PREVENT FALLS:  Life alert? No  Use of a cane, walker or w/c? No  Grab bars in the bathroom? No  Shower chair or bench in shower? No  Elevated toilet seat or a handicapped toilet? Yes   TIMED UP AND GO:  Was the test performed? No . Audio Visit  Cognitive  Function:     6CIT Screen 02/05/2022 01/30/2021 01/26/2020  What Year? 0 points 0 points 0 points  What month? 0 points 0 points 0 points  What time? 0 points - 0 points  Count back from 20 0 points 0 points 0 points  Months in reverse 0 points 0 points 0 points  Repeat phrase 0 points 0 points 0 points  Total Score 0 - 0    Immunizations Immunization History  Administered Date(s) Administered   Fluad Quad(high Dose 65+) 09/23/2019   Influenza-Unspecified 09/27/2015, 10/18/2020   Moderna Sars-Covid-2 Vaccination 01/29/2020, 02/26/2020, 10/18/2020   PFIZER Comirnaty(Gray Top)Covid-19 Tri-Sucrose Vaccine 09/16/2021   PFIZER(Purple Top)SARS-COV-2 Vaccination 08/16/2021   Tdap 04/10/2016   Zoster Recombinat (Shingrix) 04/05/2017, 09/03/2017    TDAP status: Up to date  Flu Vaccine: Patient stated received 2022 unsure of  date Pneumonia Vaccine: Patient stated received 2022 unsure of date  Covid-19 vaccine status: Completed vaccines  Qualifies for Shingles Vaccine? Yes   Zostavax completed Yes   Shingrix Completed?: Yes  Screening Tests Health Maintenance  Topic Date Due   INFLUENZA VACCINE  07/29/2021   OPHTHALMOLOGY EXAM  07/30/2021   COVID-19 Vaccine (6 - Booster for Moderna series) 11/11/2021  Pneumonia Vaccine 18+ Years old (1 - PCV) 02/05/2023 (Originally 10/21/2009)   FOOT EXAM  03/05/2022   HEMOGLOBIN A1C  04/11/2022   TETANUS/TDAP  04/10/2026   DEXA SCAN  Completed   Hepatitis C Screening  Completed   Zoster Vaccines- Shingrix  Completed   HPV VACCINES  Aged Out   COLONOSCOPY (Pts 45-65yr Insurance coverage will need to be confirmed)  Discontinued    Health Maintenance  Health Maintenance Due  Topic Date Due   INFLUENZA VACCINE  07/29/2021   OPHTHALMOLOGY EXAM  07/30/2021   COVID-19 Vaccine (6 - Booster for Moderna series) 11/11/2021    Colorectal cancer screening: No longer required.   Mammogram status: No longer required due to Age.  Bone Density  status: Completed 08/11/19. Results reflect: Bone density results: NORMAL. Repeat every 10 years.  Lung Cancer Screening: (Low Dose CT Chest recommended if Age 78-80years, 30 pack-year currently smoking OR have quit w/in 15years.) does not qualify.   Additional Screening:  Hepatitis C Screening: does qualify; Completed 04/26/21  Vision Screening: Recommended annual ophthalmology exams for early detection of glaucoma and other disorders of the eye. Is the patient up to date with their annual eye exam?  Yes  Who is the provider or what is the name of the office in which the patient attends annual eye exams? [facility] pt is not established with a provider, would they like to be referred to a provider to establish care? No .   Dental Screening: Recommended annual dental exams for proper oral hygiene  Community Resource Referral / Chronic Care Management:  CRR required this visit?  No   CCM required this visit?  No      Plan:     I have personally reviewed and noted the following in the patients chart:   Medical and social history Use of alcohol, tobacco or illicit drugs  Current medications and supplements including opioid prescriptions. Patient currently taking opioids Functional ability and status Nutritional status Physical activity Advanced directives List of other physicians Hospitalizations, surgeries, and ER visits in previous 12 months Vitals Screenings to include cognitive, depression, and falls Referrals and appointments  In addition, I have reviewed and discussed with patient certain preventive protocols, quality metrics, and best practice recommendations. A written personalized care plan for preventive services as well as general preventive health recommendations were provided to patient.     BCriselda Peaches LPN   [id number]/04/[id number]  Nurse Notes: None

## 2022-02-05 NOTE — Patient Instructions (Addendum)
[name] , Thank you for taking time to come for your Medicare Wellness Visit. I appreciate your ongoing commitment to your health goals. Please review the following plan we discussed and let me know if I can assist you in the future.   These are the goals we discussed:  Goals      Patient Stated     Avoid covid.     Patient Stated     Lose weight         This is a list of the screening recommended for you and due dates:  Health Maintenance  Topic Date Due   Flu Shot  07/29/2021   Eye exam for diabetics  07/30/2021   COVID-19 Vaccine (6 - Booster for Moderna series) 11/11/2021   Pneumonia Vaccine (1 - PCV) 02/05/2023*   Complete foot exam   03/05/2022   Hemoglobin A1C  04/11/2022   Tetanus Vaccine  04/10/2026   DEXA scan (bone density measurement)  Completed   Hepatitis C Screening: USPSTF Recommendation to screen - Ages 40-79 yo.  Completed   Zoster (Shingles) Vaccine  Completed   HPV Vaccine  Aged Out   Colon Cancer Screening  Discontinued  *Topic was postponed. The date shown is not the original due date.   Opioid Pain Medicine Management Opioids are powerful medicines that are used to treat moderate to severe pain. When used for short periods of time, they can help you to: Sleep better. Do better in physical or occupational therapy. Feel better in the first few days after an injury. Recover from surgery. Opioids should be taken with the supervision of a trained health care provider. They should be taken for the shortest period of time possible. This is because opioids can be addictive, and the longer you take opioids, the greater your risk of addiction. This addiction can also be called opioid use disorder. What are the risks? Using opioid pain medicines for longer than 3 days increases your risk of side effects. Side effects include: Constipation. Nausea and vomiting. Breathing difficulties (respiratory depression). Drowsiness. Confusion. Opioid use  disorder. Itching. Taking opioid pain medicine for a long period of time can affect your ability to do daily tasks. It also puts you at risk for: Motor vehicle crashes. Depression. Suicide. Heart attack. Overdose, which can be life-threatening. What is a pain treatment plan? A pain treatment plan is an agreement between you and your health care provider. Pain is unique to each person, and treatments vary depending on your condition. To manage your pain, you and your health care provider need to work together. To help you do this: Discuss the goals of your treatment, including how much pain you might expect to have and how you will manage the pain. Review the risks and benefits of taking opioid medicines. Remember that a good treatment plan uses more than one approach and minimizes the chance of side effects. Be honest about the amount of medicines you take and about any drug or alcohol use. Get pain medicine prescriptions from only one health care provider. Pain can be managed with many types of alternative treatments. Ask your health care provider to refer you to one or more specialists who can help you manage pain through: Physical or occupational therapy. Counseling (cognitive behavioral therapy). Good nutrition. Biofeedback. Massage. Meditation. Non-opioid medicine. Following a gentle exercise program. How to use opioid pain medicine Taking medicine Take your pain medicine exactly as told by your health care provider. Take it only when you need it.  If your pain gets less severe, you may take less than your prescribed dose if your health care provider approves. If you are not having pain, do nottake pain medicine unless your health care provider tells you to take it. If your pain is severe, do nottry to treat it yourself by taking more pills than instructed on your prescription. Contact your health care provider for help. Write down the times when you take your pain medicine. It is  easy to become confused while on pain medicine. Writing the time can help you avoid overdose. Take other over-the-counter or prescription medicines only as told by your health care provider. Keeping yourself and others safe  While you are taking opioid pain medicine: Do not drive, use machinery, or power tools. Do not sign legal documents. Do not drink alcohol. Do not take sleeping pills. Do not supervise children by yourself. Do not do activities that require climbing or being in high places. Do not go to a lake, river, ocean, spa, or swimming pool. Do not share your pain medicine with anyone. Keep pain medicine in a locked cabinet or in a secure area where pets and children cannot reach it. Stopping your use of opioids If you have been taking opioid medicine for more than a few weeks, you may need to slowly decrease (taper) how much you take until you stop completely. Tapering your use of opioids can decrease your risk of symptoms of withdrawal, such as: Pain and cramping in the abdomen. Nausea. Sweating. Sleepiness. Restlessness. Uncontrollable shaking (tremors). Cravings for the medicine. Do not attempt to taper your use of opioids on your own. Talk with your health care provider about how to do this. Your health care provider may prescribe a step-down schedule based on how much medicine you are taking and how long you have been taking it. Getting rid of leftover pills Do not save any leftover pills. Get rid of leftover pills safely by: Taking the medicine to a prescription take-back program. This is usually offered by the county or law enforcement. Bringing them to a pharmacy that has a drug disposal container. Flushing them down the toilet. Check the label or package insert of your medicine to see whether this is safe to do. Throwing them out in the trash. Check the label or package insert of your medicine to see whether this is safe to do. If it is safe to throw it out, remove  the medicine from the original container, put it into a sealable bag or container, and mix it with used coffee grounds, food scraps, dirt, or cat litter before putting it in the trash. Follow these instructions at home: Activity Do exercises as told by your health care provider. Avoid activities that make your pain worse. Return to your normal activities as told by your health care provider. Ask your health care provider what activities are safe for you. General instructions You may need to take these actions to prevent or treat constipation: Drink enough fluid to keep your urine pale yellow. Take over-the-counter or prescription medicines. Eat foods that are high in fiber, such as beans, whole grains, and fresh fruits and vegetables. Limit foods that are high in fat and processed sugars, such as fried or sweet foods. Keep all follow-up visits. This is important. Where to find support If you have been taking opioids for a long time, you may benefit from receiving support for quitting from a local support group or counselor. Ask your health care provider for a referral  to these resources in your area. Where to find more information Centers for Disease Control and Prevention (CDC): http://www.wolf.info/ U.[place]. Food and Drug Administration (FDA): [place].[employer] Get help right away if: You may have taken too much of an opioid (overdosed). Common symptoms of an overdose: Your breathing is slower or more shallow than normal. You have a very slow heartbeat (pulse). You have slurred speech. You have nausea and vomiting. Your pupils become very small. You have other potential symptoms: You are very confused. You faint or feel like you will faint. You have cold, clammy skin. You have blue lips or fingernails. You have thoughts of harming yourself or harming others. These symptoms may represent a serious problem that is an emergency. Do not wait to see if the symptoms will go away. Get medical help right away.  Call your local emergency services (911 in the [place] Do not drive yourself to the hospital.  If you ever feel like you may hurt yourself or others, or have thoughts about taking your own life, get help right away. Go to your nearest emergency department or: Call your local emergency services (911 in the [place] Call the [facility] (406)001-3785 in the [place] Call a suicide crisis helpline, such as the [place] at 617-670-9203 or 988 in the [place]. This is open 24 hours a day in the [place] Text the Crisis Text Line at (661) 379-8005 (in the [place] Summary Opioid medicines can help you manage moderate to severe pain for a short period of time. A pain treatment plan is an agreement between you and your health care provider. Discuss the goals of your treatment, including how much pain you might expect to have and how you will manage the pain. If you think that you or someone else may have taken too much of an opioid, get medical help right away. This information is not intended to replace advice given to you by your health care provider. Make sure you discuss any questions you have with your health care provider. Document Revised: 07/10/2021 Document Reviewed: 03/27/2021 Elsevier Patient Education  [place] directives: Yes Documents on file  Conditions/risks identified: None  Next appointment: Follow up in one year for your annual wellness visit    Preventive Care 65 Years and Older, Female Preventive care refers to lifestyle choices and visits with your health care provider that can promote health and wellness. What does preventive care include? A yearly physical exam. This is also called an annual well check. Dental exams once or twice a year. Routine eye exams. Ask your health care provider how often you should have your eyes checked. Personal lifestyle choices, including: Daily care of your teeth and gums. Regular physical  activity. Eating a healthy diet. Avoiding tobacco and drug use. Limiting alcohol use. Practicing safe sex. Taking low-dose aspirin every day. Taking vitamin and mineral supplements as recommended by your health care provider. What happens during an annual well check? The services and screenings done by your health care provider during your annual well check will depend on your age, overall health, lifestyle risk factors, and family history of disease. Counseling  Your health care provider may ask you questions about your: Alcohol use. Tobacco use. Drug use. Emotional well-being. Home and relationship well-being. Sexual activity. Eating habits. History of falls. Memory and ability to understand (cognition). Work and work Statistician. Reproductive health. Screening  You may have the following tests or measurements: Height, weight, and BMI. Blood pressure. Lipid and  cholesterol levels. These may be checked every 5 years, or more frequently if you are over 15 years old. Skin check. Lung cancer screening. You may have this screening every year starting at age 83 if you have a 30-pack-year history of smoking and currently smoke or have quit within the past 15 years. Fecal occult blood test (FOBT) of the stool. You may have this test every year starting at age 31. Flexible sigmoidoscopy or colonoscopy. You may have a sigmoidoscopy every 5 years or a colonoscopy every 10 years starting at age 30. Hepatitis C blood test. Hepatitis B blood test. Sexually transmitted disease (STD) testing. Diabetes screening. This is done by checking your blood sugar (glucose) after you have not eaten for a while (fasting). You may have this done every 1-3 years. Bone density scan. This is done to screen for osteoporosis. You may have this done starting at age 27. Mammogram. This may be done every 1-2 years. Talk to your health care provider about how often you should have regular mammograms. Talk with your  health care provider about your test results, treatment options, and if necessary, the need for more tests. Vaccines  Your health care provider may recommend certain vaccines, such as: Influenza vaccine. This is recommended every year. Tetanus, diphtheria, and acellular pertussis (Tdap, Td) vaccine. You may need a Td booster every 10 years. Zoster vaccine. You may need this after age 75. Pneumococcal 13-valent conjugate (PCV13) vaccine. One dose is recommended after age 19. Pneumococcal polysaccharide (PPSV23) vaccine. One dose is recommended after age 41. Talk to your health care provider about which screenings and vaccines you need and how often you need them. This information is not intended to replace advice given to you by your health care provider. Make sure you discuss any questions you have with your health care provider. Document Released: 01/11/2016 Document Revised: 09/03/2016 Document Reviewed: 10/16/2015 Elsevier Interactive Patient Education  2017 [place] Prevention in the Home Falls can cause injuries. They can happen to people of all ages. There are many things you can do to make your home safe and to help prevent falls. What can I do on the outside of my home? Regularly fix the edges of walkways and driveways and fix any cracks. Remove anything that might make you trip as you walk through a door, such as a raised step or threshold. Trim any bushes or trees on the path to your home. Use bright outdoor lighting. Clear any walking paths of anything that might make someone trip, such as rocks or tools. Regularly check to see if handrails are loose or broken. Make sure that both sides of any steps have handrails. Any raised decks and porches should have guardrails on the edges. Have any leaves, snow, or ice cleared regularly. Use sand or salt on walking paths during winter. Clean up any spills in your garage right away. This includes oil or grease spills. What can I  do in the bathroom? Use night lights. Install grab bars by the toilet and in the tub and shower. Do not use towel bars as grab bars. Use non-skid mats or decals in the tub or shower. If you need to sit down in the shower, use a plastic, non-slip stool. Keep the floor dry. Clean up any water that spills on the floor as soon as it happens. Remove soap buildup in the tub or shower regularly. Attach bath mats securely with double-sided non-slip rug tape. Do not have throw rugs and other  things on the floor that can make you trip. What can I do in the bedroom? Use night lights. Make sure that you have a light by your bed that is easy to reach. Do not use any sheets or blankets that are too big for your bed. They should not hang down onto the floor. Have a firm chair that has side arms. You can use this for support while you get dressed. Do not have throw rugs and other things on the floor that can make you trip. What can I do in the kitchen? Clean up any spills right away. Avoid walking on wet floors. Keep items that you use a lot in easy-to-reach places. If you need to reach something above you, use a strong step stool that has a grab bar. Keep electrical cords out of the way. Do not use floor polish or wax that makes floors slippery. If you must use wax, use non-skid floor wax. Do not have throw rugs and other things on the floor that can make you trip. What can I do with my stairs? Do not leave any items on the stairs. Make sure that there are handrails on both sides of the stairs and use them. Fix handrails that are broken or loose. Make sure that handrails are as long as the stairways. Check any carpeting to make sure that it is firmly attached to the stairs. Fix any carpet that is loose or worn. Avoid having throw rugs at the top or bottom of the stairs. If you do have throw rugs, attach them to the floor with carpet tape. Make sure that you have a light switch at the top of the stairs  and the bottom of the stairs. If you do not have them, ask someone to add them for you. What else can I do to help prevent falls? Wear shoes that: Do not have high heels. Have rubber bottoms. Are comfortable and fit you well. Are closed at the toe. Do not wear sandals. If you use a stepladder: Make sure that it is fully opened. Do not climb a closed stepladder. Make sure that both sides of the stepladder are locked into place. Ask someone to hold it for you, if possible. Clearly mark and make sure that you can see: Any grab bars or handrails. First and last steps. Where the edge of each step is. Use tools that help you move around (mobility aids) if they are needed. These include: Canes. Walkers. Scooters. Crutches. Turn on the lights when you go into a dark area. Replace any light bulbs as soon as they burn out. Set up your furniture so you have a clear path. Avoid moving your furniture around. If any of your floors are uneven, fix them. If there are any pets around you, be aware of where they are. Review your medicines with your doctor. Some medicines can make you feel dizzy. This can increase your chance of falling. Ask your doctor what other things that you can do to help prevent falls. This information is not intended to replace advice given to you by your health care provider. Make sure you discuss any questions you have with your health care provider. Document Released: 10/11/2009 Document Revised: 05/22/2016 Document Reviewed: 01/19/2015 Elsevier Interactive Patient Education  2017 [employer].

## 2022-02-14 DIAGNOSIS — M7741 Metatarsalgia, right foot: Secondary | ICD-10-CM | POA: Diagnosis not present

## 2022-02-14 DIAGNOSIS — M19079 Primary osteoarthritis, unspecified ankle and foot: Secondary | ICD-10-CM | POA: Diagnosis not present

## 2022-02-14 DIAGNOSIS — M79671 Pain in right foot: Secondary | ICD-10-CM | POA: Diagnosis not present

## 2022-03-05 ENCOUNTER — Telehealth: Payer: Self-pay | Admitting: Family Medicine

## 2022-03-05 MED ORDER — BUPROPION HCL ER (SR) 100 MG PO TB12: 100.0000 mg | ORAL_TABLET | Freq: Every day | ORAL | 0 refills | Status: DC

## 2022-03-05 NOTE — Telephone Encounter (Signed)
Pt call and stated she need a refill on buPROPion (WELLBUTRIN SR) 100 MG 12 hr tablet sent to  ?[facility] ([place], [place] Phone:  [phone]  ?Fax:  [phone]  ?  ? ?

## 2022-04-01 DIAGNOSIS — G4733 Obstructive sleep apnea (adult) (pediatric): Secondary | ICD-10-CM | POA: Diagnosis not present

## 2022-04-03 ENCOUNTER — Ambulatory Visit (INDEPENDENT_AMBULATORY_CARE_PROVIDER_SITE_OTHER): Payer: Medicare Other | Admitting: Family Medicine

## 2022-04-03 ENCOUNTER — Encounter: Payer: Self-pay | Admitting: Family Medicine

## 2022-04-03 VITALS — BP 132/66 | HR 77 | Temp 98.4°F | Wt 185.2 lb

## 2022-04-03 DIAGNOSIS — I10 Essential (primary) hypertension: Secondary | ICD-10-CM | POA: Diagnosis not present

## 2022-04-03 DIAGNOSIS — E119 Type 2 diabetes mellitus without complications: Secondary | ICD-10-CM | POA: Diagnosis not present

## 2022-04-03 DIAGNOSIS — H9191 Unspecified hearing loss, right ear: Secondary | ICD-10-CM

## 2022-04-03 DIAGNOSIS — H6981 Other specified disorders of Eustachian tube, right ear: Secondary | ICD-10-CM | POA: Diagnosis not present

## 2022-04-03 LAB — POCT GLYCOSYLATED HEMOGLOBIN (HGB A1C): Hemoglobin A1C: 9.2 % — AB (ref 4.0–5.6)

## 2022-04-03 NOTE — Progress Notes (Signed)
Subjective:  ? ? Patient ID: Tara Guerrero, female    DOB: [birth year], 76 y.o.   MRN: [id number] ? ?Chief Complaint  ?Patient presents with  ? Hearing Problem  ?  Started noticing it Sunday, Monday was worse, Tuesday did the wax removal drops and did not help. Right ear feels full, not muffled.   ? ? ?HPI ?Patient was seen today for acute concern.  Pt endorses hearing change over the wknd, becoming progressively worse/more muffled.  Thought wax was causing sx so used OTC Debrox drops with no improvement.  Wears a hearing aid but states the blue tooth sounds different.  Denies HAs, facial pain/pressure. ? ?Pt states she has been eating everything.  Not sure if she wants to know her A1C. ? ?Past Medical History:  ?Diagnosis Date  ? 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 Depression   ? Diabetes mellitus without complication ([place])   ? Diverticulosis   ? Hyperlipidemia   ? Hyperplastic colon polyp 11/2014  ? Hypertension   ? Sleep apnea   ? ? ?Allergies  ?Allergen Reactions  ? Codeine Other (See Comments)  ?  Agitation   ? ? ?ROS ?General: Denies fever, chills, night sweats, changes in weight, changes in appetite ?HEENT: Denies headaches, ear pain, changes in vision, rhinorrhea, sore throat  + change in hearing/muffled hearing ?CV: Denies CP, palpitations, SOB, orthopnea ?Pulm: Denies SOB, cough, wheezing ?GI: Denies abdominal pain, nausea, vomiting, diarrhea, constipation ?GU: Denies dysuria, hematuria, frequency, vaginal discharge ?Msk: Denies muscle cramps, joint pains ?Neuro: Denies weakness, numbness, tingling ?Skin: Denies rashes, bruising ?Psych: Denies depression, anxiety, hallucinations ? ?   ?Objective:  ?  ?Blood pressure 132/66, pulse 77, temperature 98.4 ?F (36.9 ?C), temperature source Oral, weight 185 lb 3.2 oz (84 kg), SpO2 96 %. ? ?Gen. Pleasant, well-nourished, in no distress, normal affect   ?HEENT: [place]/AT, face symmetric, conjunctiva clear, no scleral icterus, PERRLA, EOMI, nares patent without drainage,  pharynx without erythema or exudate.   ?No cerumen in b/l canals.  Right TM full.  Left TM normal. ?Lungs: no accessory muscle use, CTAB, no wheezes or rales ?Cardiovascular: RRR, no m/r/g, no peripheral edema ?Musculoskeletal: No deformities, no cyanosis or clubbing, normal tone ?Neuro:  A&Ox3, CN II-XII intact, normal gait ?Skin:  Warm, no lesions/ rash ? ?Diabetic Foot Exam - Simple   ?Simple Foot Form ?Diabetic Foot exam was performed with the following findings: Yes 04/03/2022  9:59 AM  ?Visual Inspection ?No deformities, no ulcerations, no other skin breakdown bilaterally: Yes ?See comments: Yes ?Sensation Testing ?Intact to touch and monofilament testing bilaterally: Yes ?Pulse Check ?Posterior Tibialis and Dorsalis pulse intact bilaterally: Yes ?Comments ?Healing surgical incision on medial surface of L foot at great toe/MTP jt.  Slightly decreased vibratory sense of lateral b/l feet. ?  ? ? ? ?Wt Readings from Last 3 Encounters:  ?04/03/22 185 lb 3.2 oz (84 kg)  ?02/05/22 188 lb (85.3 kg)  ?10/17/21 184 lb (83.5 kg)  ? ? ?Lab Results  ?Component Value Date  ? WBC 10.4 10/11/2021  ? HGB 14.8 10/11/2021  ? HCT 44.7 10/11/2021  ? PLT 290 10/11/2021  ? GLUCOSE 238 (H) 10/11/2021  ? ALT 25 10/11/2021  ? AST 24 10/11/2021  ? NA 137 10/11/2021  ? K 4.0 10/11/2021  ? CL 98 10/11/2021  ? CREATININE 1.06 (H) 10/11/2021  ? BUN 16 10/11/2021  ? CO2 26 10/11/2021  ? TSH 1.52 04/26/2021  ? HGBA1C 8.1 (H) 10/11/2021  ?  MICROALBUR <0.7 10/28/2019  ? ? ?Assessment/Plan: ? ?Dysfunction of right eustachian tube ?-Possibly caused by seasonal allergies ?-Likely causing muffled sound in the ear ?-Discussed using OTC antihistamine such as Allegra, Zyrtec, Claritin, Xyzal ?-Can also use saline nasal rinse or Flonase nasal spray ? ?Decreased hearing of right ear ?-2/2 eustachian tube dysfunction ?-OTC antihistamine/nasal saline rinse or Flonase nasal spray ?-Continue wearing hearing aid ?-For continued or worsening symptoms follow-up  with audiology ? ?Controlled type 2 diabetes mellitus without complication, without long-term current use of insulin ([place])  ?-Previously controlled his hemoglobin A1c was 8.1% ?-Hemoglobin A1c this visit now 9.2% likely 2/2 diet changes. ?-Patient advised to restart lifestyle modifications ?-Continue current medications Synjardy 12.04-999 mg twice daily. ?-Discussed checking blood sugar daily ?-For continued elevation will start additional medication ?-Foot exam done this visit ?-Continue ACE I and statin, Lipitor 20 mg ?- Plan: POC HgB A1c ? ?Essential hypertension ?-Controlled ?-Continue current medications including ramipril 10 mg daily, verapamil 180 mg nightly. ? ?F/u as needed for continued or worsening ear symptoms.  Follow-up in 3-4 months for DM ? ?Grier Mitts, MD ?

## 2022-04-18 ENCOUNTER — Other Ambulatory Visit: Payer: Self-pay | Admitting: Family Medicine

## 2022-04-18 DIAGNOSIS — Z1231 Encounter for screening mammogram for malignant neoplasm of breast: Secondary | ICD-10-CM

## 2022-04-27 DIAGNOSIS — G4733 Obstructive sleep apnea (adult) (pediatric): Secondary | ICD-10-CM | POA: Diagnosis not present

## 2022-05-06 ENCOUNTER — Other Ambulatory Visit: Payer: Self-pay | Admitting: Family Medicine

## 2022-05-06 DIAGNOSIS — L821 Other seborrheic keratosis: Secondary | ICD-10-CM | POA: Diagnosis not present

## 2022-05-06 DIAGNOSIS — I8392 Asymptomatic varicose veins of left lower extremity: Secondary | ICD-10-CM | POA: Diagnosis not present

## 2022-05-06 DIAGNOSIS — L814 Other melanin hyperpigmentation: Secondary | ICD-10-CM | POA: Diagnosis not present

## 2022-05-06 DIAGNOSIS — Z85820 Personal history of malignant melanoma of skin: Secondary | ICD-10-CM | POA: Diagnosis not present

## 2022-05-06 DIAGNOSIS — D1801 Hemangioma of skin and subcutaneous tissue: Secondary | ICD-10-CM | POA: Diagnosis not present

## 2022-05-06 DIAGNOSIS — D225 Melanocytic nevi of trunk: Secondary | ICD-10-CM | POA: Diagnosis not present

## 2022-05-06 DIAGNOSIS — D235 Other benign neoplasm of skin of trunk: Secondary | ICD-10-CM | POA: Diagnosis not present

## 2022-05-06 DIAGNOSIS — L438 Other lichen planus: Secondary | ICD-10-CM | POA: Diagnosis not present

## 2022-05-06 DIAGNOSIS — L570 Actinic keratosis: Secondary | ICD-10-CM | POA: Diagnosis not present

## 2022-05-06 DIAGNOSIS — Z85828 Personal history of other malignant neoplasm of skin: Secondary | ICD-10-CM | POA: Diagnosis not present

## 2022-07-03 ENCOUNTER — Other Ambulatory Visit: Payer: Self-pay | Admitting: Family Medicine

## 2022-07-16 ENCOUNTER — Other Ambulatory Visit: Payer: Self-pay | Admitting: Family Medicine

## 2022-07-23 DIAGNOSIS — G4733 Obstructive sleep apnea (adult) (pediatric): Secondary | ICD-10-CM | POA: Diagnosis not present

## 2022-07-25 ENCOUNTER — Other Ambulatory Visit: Payer: Self-pay | Admitting: Family Medicine

## 2022-07-25 NOTE — Telephone Encounter (Signed)
Last Ov 04/03/22 Filled 07/02/21 Is it ok to refill?

## 2022-07-25 NOTE — Telephone Encounter (Signed)
Pt is calling to request a new Rx for: verapamil (CALAN-SR) 180 MG CR tablet  Last OV: 04/03/22  Please advise.  [phone]  Please send to: [facility] ([place], [place] Phone:  [phone]  Fax:  [phone]

## 2022-08-10 ENCOUNTER — Other Ambulatory Visit: Payer: Self-pay | Admitting: Family Medicine

## 2022-08-28 ENCOUNTER — Ambulatory Visit
Admission: RE | Admit: 2022-08-28 | Discharge: 2022-08-28 | Disposition: A | Discharge status: Home / Self Care | Payer: Medicare Other | Source: Ambulatory Visit | Attending: Family Medicine | Admitting: Family Medicine

## 2022-08-28 DIAGNOSIS — Z1231 Encounter for screening mammogram for malignant neoplasm of breast: Secondary | ICD-10-CM | POA: Diagnosis not present

## 2022-09-05 ENCOUNTER — Other Ambulatory Visit: Payer: Self-pay | Admitting: Family Medicine

## 2022-09-17 ENCOUNTER — Telehealth: Payer: Self-pay | Admitting: Family Medicine

## 2022-09-17 MED ORDER — ATORVASTATIN CALCIUM 20 MG PO TABS: 20.0000 mg | ORAL_TABLET | Freq: Every day | ORAL | 0 refills | Status: DC

## 2022-09-17 NOTE — Telephone Encounter (Signed)
Pt is calling and is waiting on mail order delivery . Pt need #14 atorvastatin (LIPITOR) 20 MG tablet send to  [place], [place] AT [facility] [place] Phone:  [phone]  Fax:  [phone]

## 2022-09-17 NOTE — Telephone Encounter (Signed)
Rx done. 

## 2022-09-18 ENCOUNTER — Encounter: Payer: Self-pay | Admitting: *Deleted

## 2022-09-18 NOTE — Progress Notes (Signed)
[facility] Quality Team Note  Name: Cyla O Caracci Date of Birth: 11/22/1946 MRN: [id number] Date: 09/18/2022  [facility] Quality Team has reviewed this patient's chart, please see recommendations below:  [facility] Home Kits; [facility] Quality Coordinator mailed patient a KED home kit. [facility] will fax results directly to provider office. 

## 2022-09-23 ENCOUNTER — Ambulatory Visit: Payer: Medicare Other | Admitting: Podiatry

## 2022-09-23 DIAGNOSIS — M216X1 Other acquired deformities of right foot: Secondary | ICD-10-CM | POA: Diagnosis not present

## 2022-09-23 DIAGNOSIS — L84 Corns and callosities: Secondary | ICD-10-CM

## 2022-09-23 DIAGNOSIS — E1149 Type 2 diabetes mellitus with other diabetic neurological complication: Secondary | ICD-10-CM | POA: Diagnosis not present

## 2022-09-23 NOTE — Progress Notes (Signed)
Subjective:   Patient ID: Tara Guerrero, female   DOB: 76 y.o.   MRN: [id number]   HPI Chief Complaint  Patient presents with   Callouses    Patient came in with right foot callus on the forefoot, which started hurting the last 6 months, sharp burning pain, rate of pain 9 out of 10, patient seen Ortho and they told her to buy rocker shoes, A1c-9.2 BG- 205 (weeks ago)    4-year-old female presents with above concerns.  She says been present for the last year but is getting worse over the last 6 months.  Denies any opening.  No injuries in the started.  No recent treatment otherwise.  No other concerns.   Review of Systems  All other systems reviewed and are negative.  Past Medical History:  Diagnosis Date   Cancer ([place]) 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   Depression    Diabetes mellitus without complication ([place])    Diverticulosis    Hyperlipidemia    Hyperplastic colon polyp 11/2014   Hypertension    Sleep apnea     Past Surgical History:  Procedure Laterality Date   ABDOMINAL HYSTERECTOMY  1976   s/p uterine CA   BREAST EXCISIONAL BIOPSY Bilateral    BREAST SURGERY  1995   L breast node removal   BREAST SURGERY     R breast biopsy   CHOLECYSTECTOMY N/A 10/17/2021   Procedure: LAPAROSCOPIC CHOLECYSTECTOMY;  Surgeon: Erroll Luna, MD;  Location: [facility];  Service: General;  Laterality: N/A;   EYE MUSCLE SURGERY  2019   EYE SURGERY  2017   bilateral cataract surgery   GALLBLADDER SURGERY     JOINT REPLACEMENT  2004   bilateral knee replacements   TONSILLECTOMY  1950     Current Outpatient Medications:    acetaminophen (TYLENOL) 500 MG tablet, Take 500 mg by mouth every 6 (six) hours as needed for moderate pain., Disp: , Rfl:    APPLE CIDER VINEGAR PO, Take by mouth., Disp: , Rfl:    Ascorbic Acid (VITAMIN C) 1000 MG tablet, Take 1,000 mg by mouth 2 (two) times a week. (Patient not taking: Reported on 04/03/2022), Disp: , Rfl:    aspirin EC 81 MG tablet, Take 81 mg by mouth daily.,  Disp: , Rfl:    atorvastatin (LIPITOR) 20 MG tablet, Take 1 tablet (20 mg total) by mouth daily., Disp: 14 tablet, Rfl: 0   Blood Glucose Monitoring Suppl (ONE TOUCH ULTRA 2) w/Device KIT, Use to Check Blood glucose daily.( Dx:Z51.81), Disp: 1 each, Rfl: 0   buPROPion ER (WELLBUTRIN SR) 100 MG 12 hr tablet, TAKE 1 TABLET BY MOUTH DAILY, Disp: 90 tablet, Rfl: 3   Calcium Carb-Cholecalciferol (CALCIUM 600 + D PO), Take 1 tablet by mouth 2 (two) times a week., Disp: , Rfl:    cetirizine (ZYRTEC) 10 MG tablet, Take 10 mg by mouth daily., Disp: , Rfl:    cholecalciferol (VITAMIN D3) 25 MCG (1000 UNIT) tablet, Take 1,000 Units by mouth 2 (two) times a week. (Patient not taking: Reported on 04/03/2022), Disp: , Rfl:    cycloSPORINE (RESTASIS) 0.05 % ophthalmic emulsion, Place 1 drop into both eyes daily., Disp: , Rfl:    ferrous sulfate 325 (65 FE) MG tablet, Take 325 mg by mouth 2 (two) times a week. (Patient not taking: Reported on 04/03/2022), Disp: , Rfl:    ibuprofen (ADVIL) 800 MG tablet, Take 1 tablet (800 mg total) by mouth every 8 (eight) hours as needed.,  Disp: 30 tablet, Rfl: 0   Lactobacillus (PROBIOTIC ACIDOPHILUS PO), Take 1 tablet by mouth daily. (Patient not taking: Reported on 04/03/2022), Disp: , Rfl:    Multiple Vitamin (MULTIVITAMIN WITH MINERALS) TABS tablet, Take 1 tablet by mouth 2 (two) times a week. (Patient not taking: Reported on 04/03/2022), Disp: , Rfl:    ramipril (ALTACE) 10 MG capsule, TAKE 1 CAPSULE BY MOUTH  DAILY, Disp: 100 capsule, Rfl: 2   SYNJARDY 12.04-999 MG TABS, TAKE 1 TABLET BY MOUTH  TWICE DAILY, Disp: 200 tablet, Rfl: 2   verapamil (CALAN-SR) 180 MG CR tablet, TAKE 1 TABLET BY MOUTH AT  BEDTIME, Disp: 100 tablet, Rfl: 2  Allergies  Allergen Reactions   Codeine Other (See Comments)    Agitation           Objective:  Physical Exam  General: AAO x3, NAD  Dermatological: Hyperkeratotic lesion noted on the right plantar forefoot without any underlying ulceration  drainage or signs of infection.  There is a submetatarsal 1.  Vascular: Dorsalis Pedis artery and Posterior Tibial artery pedal pulses are 2/4 bilateral with immedate capillary fill time. There is no pain with calf compression, swelling, warmth, erythema.   Neruologic: Sensation mildly decreased with Semmes Weinstein monofilament.  Musculoskeletal: Prominent sesamoids noted on the right foot resulting in hyperkeratotic lesion.  Gait: Unassisted, Nonantalgic.       Assessment:   Prominent sesamoids with hyperkeratotic lesion, uncontrolled diabetes     Plan:  -Treatment options discussed including all alternatives, risks, and complications -Etiology of symptoms were discussed -Sharply debrided hyperkeratotic lesion without any complications or bleeding.  Recommend moisturizer and offloading daily.  I do think she will benefit from a diabetic shoe with offloading.  I will reappoint her to get measured for these.  Discussed daily foot inspection, glucose control.      -diabetic inserts offload sub1

## 2022-09-25 ENCOUNTER — Ambulatory Visit (INDEPENDENT_AMBULATORY_CARE_PROVIDER_SITE_OTHER): Payer: Medicare Other

## 2022-09-25 DIAGNOSIS — E119 Type 2 diabetes mellitus without complications: Secondary | ICD-10-CM

## 2022-09-25 NOTE — Progress Notes (Signed)
Patient presents to the office today for diabetic shoe and insole measuring.  Patient was measured with brannock device to determine size and width for 1 pair of extra depth shoes and foam casted for 3 pair of insoles.   Documentation of medical necessity will be sent to patient's treating diabetic doctor to verify and sign.   Patient's diabetic provider: Grier Mitts, MD  Shoes and insoles will be ordered at that time and patient will be notified for an appointment for fitting when they arrive.   Shoe size (per patient): 9.5 wide women's   Brannock measurement: 9 wide women's   Patient shoe selection-   Shoe choice:  [place] size ordered: 9.5 wide women's

## 2022-10-14 ENCOUNTER — Ambulatory Visit (INDEPENDENT_AMBULATORY_CARE_PROVIDER_SITE_OTHER): Payer: Medicare Other | Admitting: Family Medicine

## 2022-10-14 ENCOUNTER — Encounter: Payer: Self-pay | Admitting: Family Medicine

## 2022-10-14 VITALS — BP 140/66 | HR 70 | Temp 97.4°F | Ht 65.0 in | Wt 186.9 lb

## 2022-10-14 DIAGNOSIS — J029 Acute pharyngitis, unspecified: Secondary | ICD-10-CM | POA: Diagnosis not present

## 2022-10-14 LAB — POCT INFLUENZA A/B
Influenza A, POC: NEGATIVE
Influenza B, POC: NEGATIVE

## 2022-10-14 LAB — POCT RAPID STREP A (OFFICE): Rapid Strep A Screen: NEGATIVE

## 2022-10-14 NOTE — Progress Notes (Signed)
Established Patient Office Visit  Subjective   Patient ID: Tara Guerrero, female    DOB: 10-20-1946  Age: 76 y.o. MRN: [id number]  Chief Complaint  Patient presents with   Sore Throat        Cough    Productive cough, Tried Corisiden     HPI   Tara Guerrero seen as a work and with sore throat for approximately the past week.  No fever.  She has chronic nasal congestion which she states is unchanged.  Has had some mild cough.  No body aches.  Possible strep exposure last week.  Has been taking occasional Advil for her sore throat symptoms.  Denies any shortness of breath.  Past Medical History:  Diagnosis Date   Cancer ([place]) 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   Depression    Diabetes mellitus without complication ([place])    Diverticulosis    Hyperlipidemia    Hyperplastic colon polyp 11/2014   Hypertension    Sleep apnea    Past Surgical History:  Procedure Laterality Date   ABDOMINAL HYSTERECTOMY  1976   s/p uterine CA   BREAST EXCISIONAL BIOPSY Bilateral    BREAST SURGERY  1995   L breast node removal   BREAST SURGERY     R breast biopsy   CHOLECYSTECTOMY N/A 10/17/2021   Procedure: LAPAROSCOPIC CHOLECYSTECTOMY;  Surgeon: Erroll Luna, MD;  Location: [facility];  Service: General;  Laterality: N/A;   EYE MUSCLE SURGERY  2019   EYE SURGERY  2017   bilateral cataract surgery   GALLBLADDER SURGERY     JOINT REPLACEMENT  2004   bilateral knee replacements   TONSILLECTOMY  1950    reports that she has quit smoking. She has never used smokeless tobacco. She reports current alcohol use of about 1.0 standard drink of alcohol per week. She reports that she does not use drugs. family history includes Alcohol abuse in her father, paternal grandfather, and paternal grandmother; Breast cancer in her cousin, mother, sister, and another family member; Breast cancer (age of onset: 75) in her sister; Cancer in her mother and sister; Hearing loss in her maternal grandfather; Hyperlipidemia in her maternal  grandmother, mother, and sister; Hypertension in her maternal grandmother, mother, and sister; Mental illness in her maternal grandmother; Mental retardation in her maternal grandmother; Stroke in her maternal grandfather, mother, and sister. Allergies  Allergen Reactions   Codeine Other (See Comments)    Agitation     Review of Systems  Constitutional:  Negative for chills and fever.  HENT:  Positive for congestion and sore throat.   Respiratory:  Positive for cough. Negative for shortness of breath and wheezing.       Objective:     BP (!) 140/66 (BP Location: Left Arm, Patient Position: Sitting, Cuff Size: Normal)   Pulse 70   Temp (!) 97.4 F (36.3 C) (Oral)   Ht '5\' 5"'$  (1.651 m)   Wt 186 lb 14.4 oz (84.8 kg)   SpO2 97%   BMI 31.10 kg/m    Physical Exam Vitals reviewed.  Constitutional:      Appearance: She is well-developed.  HENT:     Right Ear: Tympanic membrane normal.     Left Ear: Tympanic membrane normal.     Mouth/Throat:     Comments: No visible posterior pharynx erythema.  No exudate. Cardiovascular:     Rate and Rhythm: Normal rate and regular rhythm.  Pulmonary:     Effort: Pulmonary effort is normal.  Breath sounds: Normal breath sounds. No wheezing or rales.  Neurological:     Mental Status: She is alert.      Results for orders placed or performed in visit on 10/14/22  POC Influenza A/B  Result Value Ref Range   Influenza A, POC Negative Negative   Influenza B, POC Negative Negative  POC Rapid Strep A  Result Value Ref Range   Rapid Strep A Screen Negative Negative      The ASCVD Risk score (Arnett DK, et al., 2019) failed to calculate for the following reasons:   Cannot find a previous HDL lab   Cannot find a previous total cholesterol lab    Assessment & Plan:   Problem List Items Addressed This Visit   None Visit Diagnoses     Sore throat    -  Primary   Relevant Orders   POC Influenza A/B (Completed)   POC Rapid Strep  A (Completed)     Patient relates sore throat symptoms for approximately 1 week.  She does have some nasal congestion and cough.  Rapid strep negative.  Influenza a and B-.   Suspect viral. -Treat symptomatically with Tylenol and plenty fluids -No indication for antibiotics at this time -Follow-up with primary for any persistent or worsening symptoms.  No follow-ups on file.    Carolann Littler, MD

## 2022-10-24 ENCOUNTER — Telehealth: Payer: Self-pay | Admitting: Family Medicine

## 2022-10-24 DIAGNOSIS — G4733 Obstructive sleep apnea (adult) (pediatric): Secondary | ICD-10-CM | POA: Diagnosis not present

## 2022-10-24 MED ORDER — ATORVASTATIN CALCIUM 20 MG PO TABS: 20.0000 mg | ORAL_TABLET | Freq: Every day | ORAL | 0 refills | Status: DC

## 2022-10-24 NOTE — Telephone Encounter (Signed)
Pt called to say:  She only needs 10 days worth of the atorvastatin (LIPITOR) 20 MG tablet  And a refill of the Verapamil  Pt currently has [facility]) and is switching to [facility] [employer]) on 12/29/2022  LOV:  10/14/22  [id number] [address], [address] AT [facility] [address] Phone:  [phone]  Fax:  [phone]

## 2022-10-24 NOTE — Telephone Encounter (Signed)
Pt called and said the last time she had verapamil refill was 07-28-2022 and she need a refill

## 2022-10-27 ENCOUNTER — Other Ambulatory Visit: Payer: Self-pay

## 2022-10-27 ENCOUNTER — Other Ambulatory Visit: Payer: Self-pay | Admitting: Family Medicine

## 2022-10-27 MED ORDER — VERAPAMIL HCL ER 180 MG PO TBCR: 180.0000 mg | EXTENDED_RELEASE_TABLET | Freq: Every day | ORAL | 0 refills | Status: DC

## 2022-10-27 NOTE — Telephone Encounter (Signed)
DM f/u scheduled, medication refill sent to requested pharmacy.

## 2022-10-27 NOTE — Telephone Encounter (Signed)
Pt states she needs the verapamil (CALAN-SR) 180 MG CR tablet which is on her expired meds list. Says she needs 44 tablets

## 2022-10-29 ENCOUNTER — Ambulatory Visit (INDEPENDENT_AMBULATORY_CARE_PROVIDER_SITE_OTHER): Payer: Medicare Other | Admitting: Family Medicine

## 2022-10-29 ENCOUNTER — Encounter: Payer: Self-pay | Admitting: Family Medicine

## 2022-10-29 VITALS — BP 120/60 | HR 79 | Temp 97.2°F | Wt 183.0 lb

## 2022-10-29 DIAGNOSIS — I10 Essential (primary) hypertension: Secondary | ICD-10-CM | POA: Diagnosis not present

## 2022-10-29 DIAGNOSIS — E1169 Type 2 diabetes mellitus with other specified complication: Secondary | ICD-10-CM

## 2022-10-29 LAB — POCT GLYCOSYLATED HEMOGLOBIN (HGB A1C): Hemoglobin A1C: 8.8 % — AB (ref 4.0–5.6)

## 2022-10-29 NOTE — Progress Notes (Signed)
Subjective:    Patient ID: Tara Guerrero, female    DOB: 1946/05/02, 76 y.o.   MRN: [id number]  Chief Complaint  Patient presents with   Follow-up    DM, and paperwork from [facility]    HPI Patient is a 76 year old female who was seen today for f/u on DM and form completion.  Pt has form for diabetic shoes.  Seen by [facility].  States has decreased foot pad on bottom of right foot which causes discomfort in right great toe.  States at times has felt like "a hot poker sticking into toe that will not come out".  Patient also endorses intermittent tingling in feet.  Denies numbness.  Patient states that she does not need refills on medications at this time as she has plenty of medication.  Past Medical History:  Diagnosis Date   Cancer ([place]) 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   Depression    Diabetes mellitus without complication ([place])    Diverticulosis    Hyperlipidemia    Hyperplastic colon polyp 11/2014   Hypertension    Sleep apnea     Allergies  Allergen Reactions   Codeine Other (See Comments)    Agitation     ROS General: Denies fever, chills, night sweats, changes in weight, changes in appetite HEENT: Denies headaches, ear pain, changes in vision, rhinorrhea, sore throat CV: Denies CP, palpitations, SOB, orthopnea Pulm: Denies SOB, cough, wheezing GI: Denies abdominal pain, nausea, vomiting, diarrhea, constipation GU: Denies dysuria, hematuria, frequency, vaginal discharge Msk: Denies muscle cramps, joint pains + right great toe pain Neuro: Denies weakness, numbness, tingling + tingling in bilateral feet Skin: Denies rashes, bruising Psych: Denies depression, anxiety, hallucinations     Objective:    Blood pressure 120/60, pulse 79, temperature (!) 97.2 F (36.2 C), temperature source Temporal, weight 183 lb (83 kg), SpO2 96 %.  Gen. Pleasant, well-nourished, in no distress, normal affect  HEENT: [place]/AT, face symmetric, conjunctiva clear, no scleral icterus, PERRLA, EOMI,  nares patent without drainage Lungs: no accessory muscle use, CTAB, no wheezes or rales Cardiovascular: RRR, no m/r/g, no peripheral edema Neuro:  A&Ox3, CN II-XII intact, normal gait Skin:  Warm, dry, intact, no lesions/ rash.  Well-healed surgical incision of medial left foot.  DP and PT pulses 2+ bilaterally.  Small callus on plantar surface of right foot.  Wt Readings from Last 3 Encounters:  10/29/22 183 lb (83 kg)  10/14/22 186 lb 14.4 oz (84.8 kg)  04/03/22 185 lb 3.2 oz (84 kg)    Lab Results  Component Value Date   WBC 10.4 10/11/2021   HGB 14.8 10/11/2021   HCT 44.7 10/11/2021   PLT 290 10/11/2021   GLUCOSE 238 (H) 10/11/2021   ALT 25 10/11/2021   AST 24 10/11/2021   NA 137 10/11/2021   K 4.0 10/11/2021   CL 98 10/11/2021   CREATININE 1.06 (H) 10/11/2021   BUN 16 10/11/2021   CO2 26 10/11/2021   TSH 1.52 04/26/2021   HGBA1C 8.8 (A) 10/29/2022   MICROALBUR <0.7 10/28/2019    Assessment/Plan:  Type 2 diabetes mellitus with other specified complication, without long-term current use of insulin (HCC) -Hemoglobin A1c 9.2%/6/23 -Hemoglobin A1c 8.8% this visit -Continue Synjardy 12.04-999 mg twice daily -Continue lifestyle modifications -Foot exam done this visit -Forms completed for diabetic shoes -Continue statin, Lipitor 20 mg daily - Plan: POCT glycosylated hemoglobin (Hb A1C), Microalbumin/Creatinine Ratio, Urine  Essential hypertension -Controlled -continue current meds: Verapamil 180 mg nightly, ramipril  10 mg daily -Continue lifestyle modifications -Continue monitoring BP.  F/u as needed in the next few months for CPE  Grier Mitts, MD

## 2022-10-30 LAB — MICROALBUMIN / CREATININE URINE RATIO
Creatinine,U: 35.8 mg/dL
Microalb Creat Ratio: 2 mg/g (ref 0.0–30.0)
Microalb, Ur: <0.7 mg/dL (ref 0.0–1.9)

## 2022-11-03 NOTE — Progress Notes (Signed)
Pt completed Urine Albumin Creatinine Ratio test but needs EGFR in order to close gap.

## 2022-11-06 DIAGNOSIS — L821 Other seborrheic keratosis: Secondary | ICD-10-CM | POA: Diagnosis not present

## 2022-11-06 DIAGNOSIS — L718 Other rosacea: Secondary | ICD-10-CM | POA: Diagnosis not present

## 2022-11-06 DIAGNOSIS — Z85828 Personal history of other malignant neoplasm of skin: Secondary | ICD-10-CM | POA: Diagnosis not present

## 2022-11-06 DIAGNOSIS — D2372 Other benign neoplasm of skin of left lower limb, including hip: Secondary | ICD-10-CM | POA: Diagnosis not present

## 2022-11-06 DIAGNOSIS — Z85820 Personal history of malignant melanoma of skin: Secondary | ICD-10-CM | POA: Diagnosis not present

## 2022-11-06 DIAGNOSIS — L570 Actinic keratosis: Secondary | ICD-10-CM | POA: Diagnosis not present

## 2022-11-06 DIAGNOSIS — I8391 Asymptomatic varicose veins of right lower extremity: Secondary | ICD-10-CM | POA: Diagnosis not present

## 2022-11-06 DIAGNOSIS — D1801 Hemangioma of skin and subcutaneous tissue: Secondary | ICD-10-CM | POA: Diagnosis not present

## 2022-11-19 ENCOUNTER — Other Ambulatory Visit: Payer: Self-pay | Admitting: Family Medicine

## 2022-11-19 DIAGNOSIS — G4733 Obstructive sleep apnea (adult) (pediatric): Secondary | ICD-10-CM | POA: Diagnosis not present

## 2022-12-05 ENCOUNTER — Other Ambulatory Visit (HOSPITAL_COMMUNITY): Payer: Self-pay

## 2022-12-11 ENCOUNTER — Telehealth: Payer: Self-pay | Admitting: Podiatry

## 2022-12-11 NOTE — Telephone Encounter (Signed)
Spoke with patient regarding diabetic shoes. Informed her that we have not received the paperwork back yet. CMN refaxed and patient is aware. Patient states that her insurance is changing at the first of the year so she is hopeful to have picked up her products by then.

## 2022-12-17 ENCOUNTER — Other Ambulatory Visit: Payer: Self-pay | Admitting: Family Medicine

## 2022-12-30 ENCOUNTER — Telehealth: Payer: Self-pay | Admitting: Family Medicine

## 2022-12-30 ENCOUNTER — Other Ambulatory Visit (HOSPITAL_COMMUNITY): Payer: Self-pay

## 2022-12-30 NOTE — Telephone Encounter (Signed)
Patient stopped by because she is needing refills on her atorvastatin (LIPITOR) 20 MG tablet   buPROPion ER (WELLBUTRIN SR) 100 MG 12 hr tablet   SYNJARDY 12.04-999 MG TABS      She is almost out of these medications and will need a refill ASAP.    Please send to [facility]. [address] [phone]     Please advise

## 2022-12-31 ENCOUNTER — Other Ambulatory Visit (HOSPITAL_COMMUNITY): Payer: Self-pay

## 2022-12-31 ENCOUNTER — Ambulatory Visit (INDEPENDENT_AMBULATORY_CARE_PROVIDER_SITE_OTHER): Payer: PPO

## 2022-12-31 DIAGNOSIS — E119 Type 2 diabetes mellitus without complications: Secondary | ICD-10-CM | POA: Diagnosis not present

## 2022-12-31 MED ORDER — BUPROPION HCL ER (SR) 100 MG PO TB12
100.0000 mg | ORAL_TABLET | Freq: Every day | ORAL | 2 refills | Status: DC
  Filled 2022-12-31: qty 100, 100d supply, fill #0
  Filled 2023-04-14 (×2): qty 100, 100d supply, fill #1
  Filled 2023-07-30 – 2023-07-31 (×2): qty 70, 70d supply, fill #2

## 2022-12-31 MED ORDER — SYNJARDY 12.5-1000 MG PO TABS
1.0000 | ORAL_TABLET | Freq: Two times a day (BID) | ORAL | 2 refills | Status: DC
  Filled 2022-12-31: qty 200, 100d supply, fill #0
  Filled 2023-04-08: qty 200, 100d supply, fill #1
  Filled 2023-07-30 – 2023-07-31 (×3): qty 200, 100d supply, fill #2
  Filled 2023-08-04: qty 60, 30d supply, fill #2
  Filled 2023-09-07: qty 60, 30d supply, fill #0
  Filled 2023-10-06: qty 60, 30d supply, fill #1
  Filled 2023-11-13: qty 60, 30d supply, fill #2

## 2022-12-31 MED ORDER — ATORVASTATIN CALCIUM 20 MG PO TABS
20.0000 mg | ORAL_TABLET | Freq: Every day | ORAL | 2 refills | Status: DC
  Filled 2022-12-31: qty 100, 100d supply, fill #0
  Filled 2023-04-08: qty 100, 100d supply, fill #1
  Filled 2023-07-30 – 2023-07-31 (×2): qty 70, 70d supply, fill #2

## 2022-12-31 NOTE — Progress Notes (Signed)
Patient presents today to pick up diabetic shoes and insoles.  Patient was dispensed 1 pair of diabetic shoes and 3 pairs of foam casted diabetic insoles. Fit was satisfactory. Instructions for break-in and wear was reviewed and a copy was given to the patient.   Re-appointment for regularly scheduled diabetic foot care visits or if they should experience any trouble with the shoes or insoles.  

## 2022-12-31 NOTE — Telephone Encounter (Signed)
Rx's sent in. °

## 2023-01-01 ENCOUNTER — Other Ambulatory Visit (HOSPITAL_COMMUNITY): Payer: Self-pay

## 2023-01-15 DIAGNOSIS — E1122 Type 2 diabetes mellitus with diabetic chronic kidney disease: Secondary | ICD-10-CM | POA: Diagnosis not present

## 2023-01-15 DIAGNOSIS — E1165 Type 2 diabetes mellitus with hyperglycemia: Secondary | ICD-10-CM | POA: Diagnosis not present

## 2023-01-15 DIAGNOSIS — E1136 Type 2 diabetes mellitus with diabetic cataract: Secondary | ICD-10-CM | POA: Diagnosis not present

## 2023-01-15 DIAGNOSIS — E669 Obesity, unspecified: Secondary | ICD-10-CM | POA: Diagnosis not present

## 2023-01-15 DIAGNOSIS — G4733 Obstructive sleep apnea (adult) (pediatric): Secondary | ICD-10-CM | POA: Diagnosis not present

## 2023-01-15 DIAGNOSIS — G8929 Other chronic pain: Secondary | ICD-10-CM | POA: Diagnosis not present

## 2023-01-15 DIAGNOSIS — E1169 Type 2 diabetes mellitus with other specified complication: Secondary | ICD-10-CM | POA: Diagnosis not present

## 2023-01-15 DIAGNOSIS — G63 Polyneuropathy in diseases classified elsewhere: Secondary | ICD-10-CM | POA: Diagnosis not present

## 2023-01-15 DIAGNOSIS — F3342 Major depressive disorder, recurrent, in full remission: Secondary | ICD-10-CM | POA: Diagnosis not present

## 2023-01-15 DIAGNOSIS — C4372 Malignant melanoma of left lower limb, including hip: Secondary | ICD-10-CM | POA: Diagnosis not present

## 2023-01-15 DIAGNOSIS — E785 Hyperlipidemia, unspecified: Secondary | ICD-10-CM | POA: Diagnosis not present

## 2023-01-15 DIAGNOSIS — F411 Generalized anxiety disorder: Secondary | ICD-10-CM | POA: Diagnosis not present

## 2023-01-26 DIAGNOSIS — E119 Type 2 diabetes mellitus without complications: Secondary | ICD-10-CM | POA: Diagnosis not present

## 2023-01-26 LAB — HM DIABETES EYE EXAM

## 2023-02-06 ENCOUNTER — Ambulatory Visit (INDEPENDENT_AMBULATORY_CARE_PROVIDER_SITE_OTHER): Payer: PPO

## 2023-02-06 VITALS — Ht 65.0 in | Wt 185.0 lb

## 2023-02-06 DIAGNOSIS — Z0000 Encounter for general adult medical examination without abnormal findings: Secondary | ICD-10-CM | POA: Diagnosis not present

## 2023-02-06 NOTE — Patient Instructions (Addendum)
Tara Guerrero , Thank you for taking time to come for your Medicare Wellness Visit. I appreciate your ongoing commitment to your health goals. Please review the following plan we discussed and let me know if I can assist you in the future.   These are the goals we discussed:  Goals       No current goals (pt-stated)      Patient Stated      Avoid covid.      Patient Stated      Lose weight         This is a list of the screening recommended for you and due dates:  Health Maintenance  Topic Date Due   Yearly kidney function blood test for diabetes  10/11/2022   Eye exam for diabetics  02/06/2023*   COVID-19 Vaccine (8 - 2023-24 season) 02/22/2023*   Pneumonia Vaccine (1 of 1 - PCV) 02/07/2024*   Hemoglobin A1C  04/29/2023   Yearly kidney health urinalysis for diabetes  10/30/2023   Complete foot exam   10/30/2023   Medicare Annual Wellness Visit  02/07/2024   DTaP/Tdap/Td vaccine (2 - Td or Tdap) 04/10/2026   Flu Shot  Completed   DEXA scan (bone density measurement)  Completed   Hepatitis C Screening: USPSTF Recommendation to screen - Ages 82-79 yo.  Completed   Zoster (Shingles) Vaccine  Completed   HPV Vaccine  Aged Out   Colon Cancer Screening  Discontinued  *Topic was postponed. The date shown is not the original due date.    Advanced directives: In Chart  Conditions/risks identified: None  Next appointment: Follow up in one year for your annual wellness visit    Preventive Care 65 Years and Older, Female Preventive care refers to lifestyle choices and visits with your health care provider that can promote health and wellness. What does preventive care include? A yearly physical exam. This is also called an annual well check. Dental exams once or twice a year. Routine eye exams. Ask your health care provider how often you should have your eyes checked. Personal lifestyle choices, including: Daily care of your teeth and gums. Regular physical activity. Eating a  healthy diet. Avoiding tobacco and drug use. Limiting alcohol use. Practicing safe sex. Taking low-dose aspirin every day. Taking vitamin and mineral supplements as recommended by your health care provider. What happens during an annual well check? The services and screenings done by your health care provider during your annual well check will depend on your age, overall health, lifestyle risk factors, and family history of disease. Counseling  Your health care provider may ask you questions about your: Alcohol use. Tobacco use. Drug use. Emotional well-being. Home and relationship well-being. Sexual activity. Eating habits. History of falls. Memory and ability to understand (cognition). Work and work Statistician. Reproductive health. Screening  You may have the following tests or measurements: Height, weight, and BMI. Blood pressure. Lipid and cholesterol levels. These may be checked every 5 years, or more frequently if you are over 3 years old. Skin check. Lung cancer screening. You may have this screening every year starting at age 23 if you have a 30-pack-year history of smoking and currently smoke or have quit within the past 15 years. Fecal occult blood test (FOBT) of the stool. You may have this test every year starting at age 26. Flexible sigmoidoscopy or colonoscopy. You may have a sigmoidoscopy every 5 years or a colonoscopy every 10 years starting at age 2. Hepatitis C blood test.  Hepatitis B blood test. Sexually transmitted disease (STD) testing. Diabetes screening. This is done by checking your blood sugar (glucose) after you have not eaten for a while (fasting). You may have this done every 1-3 years. Bone density scan. This is done to screen for osteoporosis. You may have this done starting at age 110. Mammogram. This may be done every 1-2 years. Talk to your health care provider about how often you should have regular mammograms. Talk with your health care  provider about your test results, treatment options, and if necessary, the need for more tests. Vaccines  Your health care provider may recommend certain vaccines, such as: Influenza vaccine. This is recommended every year. Tetanus, diphtheria, and acellular pertussis (Tdap, Td) vaccine. You may need a Td booster every 10 years. Zoster vaccine. You may need this after age 68. Pneumococcal 13-valent conjugate (PCV13) vaccine. One dose is recommended after age 73. Pneumococcal polysaccharide (PPSV23) vaccine. One dose is recommended after age 26. Talk to your health care provider about which screenings and vaccines you need and how often you need them. This information is not intended to replace advice given to you by your health care provider. Make sure you discuss any questions you have with your health care provider. Document Released: 01/11/2016 Document Revised: 09/03/2016 Document Reviewed: 10/16/2015 Elsevier Interactive Patient Education  2017 [place] Prevention in the Home Falls can cause injuries. They can happen to people of all ages. There are many things you can do to make your home safe and to help prevent falls. What can I do on the outside of my home? Regularly fix the edges of walkways and driveways and fix any cracks. Remove anything that might make you trip as you walk through a door, such as a raised step or threshold. Trim any bushes or trees on the path to your home. Use bright outdoor lighting. Clear any walking paths of anything that might make someone trip, such as rocks or tools. Regularly check to see if handrails are loose or broken. Make sure that both sides of any steps have handrails. Any raised decks and porches should have guardrails on the edges. Have any leaves, snow, or ice cleared regularly. Use sand or salt on walking paths during winter. Clean up any spills in your garage right away. This includes oil or grease spills. What can I do in the  bathroom? Use night lights. Install grab bars by the toilet and in the tub and shower. Do not use towel bars as grab bars. Use non-skid mats or decals in the tub or shower. If you need to sit down in the shower, use a plastic, non-slip stool. Keep the floor dry. Clean up any water that spills on the floor as soon as it happens. Remove soap buildup in the tub or shower regularly. Attach bath mats securely with double-sided non-slip rug tape. Do not have throw rugs and other things on the floor that can make you trip. What can I do in the bedroom? Use night lights. Make sure that you have a light by your bed that is easy to reach. Do not use any sheets or blankets that are too big for your bed. They should not hang down onto the floor. Have a firm chair that has side arms. You can use this for support while you get dressed. Do not have throw rugs and other things on the floor that can make you trip. What can I do in the kitchen? Clean up  any spills right away. Avoid walking on wet floors. Keep items that you use a lot in easy-to-reach places. If you need to reach something above you, use a strong step stool that has a grab bar. Keep electrical cords out of the way. Do not use floor polish or wax that makes floors slippery. If you must use wax, use non-skid floor wax. Do not have throw rugs and other things on the floor that can make you trip. What can I do with my stairs? Do not leave any items on the stairs. Make sure that there are handrails on both sides of the stairs and use them. Fix handrails that are broken or loose. Make sure that handrails are as long as the stairways. Check any carpeting to make sure that it is firmly attached to the stairs. Fix any carpet that is loose or worn. Avoid having throw rugs at the top or bottom of the stairs. If you do have throw rugs, attach them to the floor with carpet tape. Make sure that you have a light switch at the top of the stairs and the  bottom of the stairs. If you do not have them, ask someone to add them for you. What else can I do to help prevent falls? Wear shoes that: Do not have high heels. Have rubber bottoms. Are comfortable and fit you well. Are closed at the toe. Do not wear sandals. If you use a stepladder: Make sure that it is fully opened. Do not climb a closed stepladder. Make sure that both sides of the stepladder are locked into place. Ask someone to hold it for you, if possible. Clearly mark and make sure that you can see: Any grab bars or handrails. First and last steps. Where the edge of each step is. Use tools that help you move around (mobility aids) if they are needed. These include: Canes. Walkers. Scooters. Crutches. Turn on the lights when you go into a dark area. Replace any light bulbs as soon as they burn out. Set up your furniture so you have a clear path. Avoid moving your furniture around. If any of your floors are uneven, fix them. If there are any pets around you, be aware of where they are. Review your medicines with your doctor. Some medicines can make you feel dizzy. This can increase your chance of falling. Ask your doctor what other things that you can do to help prevent falls. This information is not intended to replace advice given to you by your health care provider. Make sure you discuss any questions you have with your health care provider. Document Released: 10/11/2009 Document Revised: 05/22/2016 Document Reviewed: 01/19/2015 Elsevier Interactive Patient Education  2017 [employer].

## 2023-02-06 NOTE — Progress Notes (Signed)
Subjective:   Tara Guerrero is a 77 y.o. female who presents for Medicare Annual (Subsequent) preventive examination.  Review of Systems    Virtual Visit via Telephone Note  I connected with  Tara Guerrero on 02/06/23 at 11:30 AM EST by telephone and verified that I am speaking with the correct person using two identifiers.  Location: Patient: Home  Provider: Office Persons participating in the virtual visit: patient/Nurse Health Advisor   I discussed the limitations, risks, security and privacy concerns of performing an evaluation and management service by telephone and the availability of in person appointments. The patient expressed understanding and agreed to proceed.  Interactive audio and video telecommunications were attempted between this nurse and patient, however failed, due to patient having technical difficulties OR patient did not have access to video capability.  We continued and completed visit with audio only.  Some vital signs may be absent or patient reported.   Criselda Peaches, LPN  Cardiac Risk Factors include: advanced age (>61mn, >>65women);diabetes mellitus;hypertension     Objective:    Today's Vitals   02/06/23 1140  Weight: 185 lb (83.9 kg)  Height: 5' 5"$  (1.651 m)   Body mass index is 30.79 kg/m.     02/06/2023   11:51 AM 02/05/2022   11:34 AM 10/17/2021   12:44 PM 10/11/2021    3:09 PM 08/26/2021   12:15 PM 01/30/2021   11:02 AM 01/21/2021    3:46 PM  Advanced Directives  Does Patient Have a Medical Advance Directive? Yes Yes Yes No No Yes Yes  Type of AParamedicof [place]Living will [place]Mod[place] TownLiving will [place]kn[place]Living will   [place]air Oaks RanchLiving will   Does patient want to make changes to medical advance directive? No - Patient declined No - Patient declined No - Patient declined    No - Patient declined  Copy of HKidronin Chart? Yes -  validated most recent copy scanned in chart (See row information) Yes - validated most recent copy scanned in chart (See row information) Yes - validated most recent copy scanned in chart (See row information)   No - copy requested   Would patient like information on creating a medical advance directive?    Yes (MAU/Ambulatory/Procedural Areas - Information given) No - Patient declined      Current Medications (verified) Outpatient Encounter Medications as of 02/06/2023  Medication Sig   acetaminophen (TYLENOL) 500 MG tablet Take 500 mg by mouth every 6 (six) hours as needed for moderate pain.   APPLE CIDER VINEGAR PO Take by mouth.   Ascorbic Acid (VITAMIN C) 1000 MG tablet Take 1,000 mg by mouth 2 (two) times a week. (Patient not taking: Reported on 10/29/2022)   aspirin EC 81 MG tablet Take 81 mg by mouth daily.   atorvastatin (LIPITOR) 20 MG tablet Take 1 tablet (20 mg total) by mouth daily.   Blood Glucose Monitoring Suppl (ONE TOUCH ULTRA 2) w/Device KIT Use to Check Blood glucose daily.( Dx:Z51.81)   buPROPion ER (WELLBUTRIN SR) 100 MG 12 hr tablet Take 1 tablet (100 mg total) by mouth daily.   Calcium Carb-Cholecalciferol (CALCIUM 600 + D PO) Take 1 tablet by mouth 2 (two) times a week. (Patient not taking: Reported on 10/29/2022)   cetirizine (ZYRTEC) 10 MG tablet Take 10 mg by mouth daily.   cycloSPORINE (RESTASIS) 0.05 % ophthalmic emulsion Place 1 drop into both eyes daily.  Empagliflozin-metFORMIN HCl (SYNJARDY) 12.04-999 MG TABS Take 1 tablet by mouth 2 (two) times daily.   Multiple Vitamin (MULTIVITAMIN WITH MINERALS) TABS tablet Take 1 tablet by mouth 2 (two) times a week.   ramipril (ALTACE) 10 MG capsule TAKE 1 CAPSULE BY MOUTH  DAILY   verapamil (CALAN-SR) 180 MG CR tablet TAKE 1 TABLET(180 MG) BY MOUTH AT BEDTIME   [DISCONTINUED] Lactobacillus (PROBIOTIC ACIDOPHILUS PO) Take 1 tablet by mouth daily. (Patient not taking: Reported on 10/29/2022)   No facility-administered  encounter medications on file as of 02/06/2023.    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   Depression    Diabetes mellitus without complication ([place])    Diverticulosis    Hyperlipidemia    Hyperplastic colon polyp 11/2014   Hypertension    Sleep apnea    Past Surgical History:  Procedure Laterality Date   ABDOMINAL HYSTERECTOMY  1976   s/p uterine CA   BREAST EXCISIONAL BIOPSY Bilateral    BREAST SURGERY  1995   L breast node removal   BREAST SURGERY     R breast biopsy   CHOLECYSTECTOMY N/A 10/17/2021   Procedure: LAPAROSCOPIC CHOLECYSTECTOMY;  Surgeon: Erroll Luna, MD;  Location: [place];  Service: General;  Laterality: N/A;   EYE MUSCLE SURGERY  2019   EYE SURGERY  2017   bilateral cataract surgery   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   TONSILLECTOMY  1950   Family History  Problem Relation Age of Onset   Cancer Mother    Hyperlipidemia Mother    Hypertension Mother    Stroke Mother    Breast cancer Mother        diagnosed in her 63's   Alcohol abuse Father    Cancer Sister    Hyperlipidemia Sister    Hypertension Sister    Stroke Sister    Breast cancer Sister        diagnosed in her 2's   Mental illness Maternal Grandmother    Hypertension Maternal Grandmother    Hyperlipidemia Maternal Grandmother    Mental retardation Maternal Grandmother    Hearing loss Maternal Grandfather    Stroke Maternal Grandfather    Alcohol abuse Paternal Grandmother    Alcohol abuse Paternal Grandfather    Breast cancer Cousin        diagnosed in her 27's   Breast cancer Sister 65   Breast cancer Other        diagnosed late 20's   Social History   Socioeconomic History   Marital status: Single    Spouse name: Not on file   Number of children: 1   Years of education: 14   Highest education level: Associate degree: occupational, Hotel manager, or  vocational program  Occupational History   Occupation: retired  Tobacco Use   Smoking status: Former   Smokeless tobacco: Never  Scientific laboratory technician Use: Never used  Substance and Sexual Activity   Alcohol use: Yes    Alcohol/week: 1.0 standard drink of alcohol    Types: 1 Glasses of wine per week    Comment: Social   Drug use: Never   Sexual activity: Not Currently  Other Topics Concern   Not on file  Social History Narrative   01/24/2019: Lives with granddaughter and dog.    Retired    Investment banker, operational of Health  Financial Resource Strain: Low Risk  (02/06/2023)   Overall Financial Resource Strain (CARDIA)    Difficulty of Paying Living Expenses: Not hard at all  Food Insecurity: No Food Insecurity (02/06/2023)   Hunger Vital Sign    Worried About Running Out of Food in the Last Year: Never true    Ran Out of Food in the Last Year: Never true  Transportation Needs: No Transportation Needs (02/06/2023)   PRAPARE - Hydrologist (Medical): No    Lack of Transportation (Non-Medical): No  Physical Activity: Sufficiently Active (02/06/2023)   Exercise Vital Sign    Days of Exercise per Week: 7 days    Minutes of Exercise per Session: 30 min  Stress: No Stress Concern Present (02/06/2023)   [place]    Feeling of Stress : Not at all  Social Connections: Moderately Integrated (02/06/2023)   Social Connection and Isolation Panel [NHANES]    Frequency of Communication with Friends and Family: More than three times a week    Frequency of Social Gatherings with Friends and Family: More than three times a week    Attends Religious Services: More than 4 times per year    Active Member of [employer] or Organizations: Yes    Attends Music therapist: More than 4 times per year    Marital Status: Divorced    Tobacco Counseling Counseling given: Not Answered   Clinical  Intake:  Pre-visit preparation completed: YesNutrition Risk Assessment:  Has the patient had any N/V/D within the last 2 months?  No  Does the patient have any non-healing wounds?  No  Has the patient had any unintentional weight loss or weight gain?  No   Diabetes:  Is the patient diabetic?  Yes  If diabetic, was a CBG obtained today?  Yes CBG 220 Taken by patient Did the patient bring in their glucometer from home?  No  How often do you monitor your CBG's? PRN.   Financial Strains and Diabetes Management:  Are you having any financial strains with the device, your supplies or your medication? No .  Does the patient want to be seen by Chronic Care Management for management of their diabetes?  No  Would the patient like to be referred to a Nutritionist or for Diabetic Management?  No   Diabetic Exams:  Diabetic Eye Exam: Completed No. Overdue for diabetic eye exam. Pt has been advised about the importance in completing this exam. A referral has been placed today. Message sent to referral coordinator for scheduling purposes. Advised pt to expect a call from office referred to regarding appt.  Diabetic Foot Exam: Completed No. Pt has been advised about the importance in completing this exam. Pt is scheduled for diabetic foot exam on Followed by PCP.    Pain : No/denies pain     BMI - recorded: 30.79 Nutritional Status: BMI > 30  Obese Nutritional Risks: None Diabetes: Yes CBG done?: Yes (CBG 220 Taken by patient) CBG resulted in Enter/ Edit results?: Yes Did pt. bring in CBG monitor from home?: No  How often do you need to have someone help you when you read instructions, pamphlets, or other written materials from your doctor or pharmacy?: 1 - Never  Diabetic?  Yes  Interpreter Needed?: No  Information entered by :: Rolene Arbour LPN   Activities of Daily Living    02/06/2023   11:49 AM 02/06/2023   11:09 AM  In your present state of health, do you have any difficulty  performing the following activities:  Hearing? 1 0  Comment wears Hearing aids   Vision? 0 0  Difficulty concentrating or making decisions? 0 0  Walking or climbing stairs? 0 0  Dressing or bathing? 0 0  Doing errands, shopping? 0 0  Preparing Food and eating ? N N  Using the Toilet? N N  In the past six months, have you accidently leaked urine? Tempie Donning  Comment Wears pads.Followed by PCP   Do you have problems with loss of bowel control? N Y  Managing your Medications? N N  Managing your Finances? N N  Housekeeping or managing your Housekeeping? N N    Patient Care Team: Billie Ruddy, MD as PCP - General (Family Medicine)  Indicate any recent Medical Services you may have received from other than [facility] providers in the past year (date may be approximate).     Assessment:   This is a routine wellness examination for Kiaraliz.  Hearing/Vision screen Hearing Screening - Comments:: Wear Hearing aids Vision Screening - Comments:: Wears rx glasses - up to date with routinShahe eye exams with  Dr   Dietary issues and exercise activities discussed: Exercise limited by: None identified   Goals Addressed               This Visit's Progress     No current goals (pt-stated)         Depression Screen    02/06/2023   11:47 AM 04/03/2022    9:01 AM 02/05/2022   11:29 AM 01/30/2021   11:00 AM 01/26/2020   11:01 AM 01/24/2019   10:58 AM  PHQ 2/9 Scores  PHQ - 2 Score 0 0 0 0 0   PHQ- 9 Score  0      Exception Documentation      Patient refusal    Fall Risk    02/06/2023   11:50 AM 02/06/2023   11:09 AM 04/03/2022    9:01 AM 02/05/2022   11:33 AM 01/30/2021   11:03 AM  Fall Risk   Falls in the past year? 0 1 0 0 1  Number falls in past yr: 0 0 0 0 1  Injury with Fall? 0 0 0 0 1  Comment     bruised arm when walking dog and fell, fell in bedroom off stool  Risk for fall due to : No Fall Risks  No Fall Risks No Fall Risks Impaired vision;History of fall(s)  Follow up Falls prevention  discussed  Falls evaluation completed  Falls prevention discussed    FALL RISK PREVENTION PERTAINING TO THE HOME:  Any stairs in or around the home? Yes  If so, are there any without handrails? No  Home free of loose throw rugs in walkways, pet beds, electrical cords, etc? Yes  Adequate lighting in your home to reduce risk of falls? Yes   ASSISTIVE DEVICES UTILIZED TO PREVENT FALLS:  Life alert? No  Use of a cane, walker or w/c? No  Grab bars in the bathroom? No  Shower chair or bench in shower? No  Elevated toilet seat or a handicapped toilet? Yes   TIMED UP AND GO:  Was the test performed? No . Audio Visit   Cognitive Function:        02/06/2023   11:51 AM 02/05/2022   11:34 AM 01/30/2021   11:08 AM 01/26/2020   11:02 AM  6CIT Screen  What  Year? 0 points 0 points 0 points 0 points  What month? 0 points 0 points 0 points 0 points  What time? 0 points 0 points  0 points  Count back from 20 0 points 0 points 0 points 0 points  Months in reverse 0 points 0 points 0 points 0 points  Repeat phrase 0 points 0 points 0 points 0 points  Total Score 0 points 0 points  0 points    Immunizations Immunization History  Administered Date(s) Administered   Fluad Quad(high Dose 65+) 09/23/2019   Influenza, High Dose Seasonal PF 09/30/2022   Influenza-Unspecified 09/27/2015, 10/18/2020   Moderna Sars-Covid-2 Vaccination 01/29/2020, 02/26/2020, 10/18/2020   PFIZER Comirnaty(Gray Top)Covid-19 Tri-Sucrose Vaccine 04/01/2021, 09/16/2021   PFIZER(Purple Top)SARS-COV-2 Vaccination 08/16/2021, 09/30/2022   Respiratory Syncytial Virus Vaccine,Recomb Aduvanted(Arexvy) 09/30/2022   Tdap 04/10/2016   Zoster Recombinat (Shingrix) 04/05/2017, 09/03/2017    TDAP status: Up to date  Flu Vaccine status: Up to date  Pneumococcal vaccine status: Due, Education has been provided regarding the importance of this vaccine. Advised may receive this vaccine at local pharmacy or Health Dept. Aware to  provide a copy of the vaccination record if obtained from local pharmacy or Health Dept. Verbalized acceptance and understanding.  Covid-19 vaccine status: Completed vaccines  Qualifies for Shingles Vaccine? Yes   Zostavax completed Yes   Shingrix Completed?: Yes  Screening Tests Health Maintenance  Topic Date Due   Diabetic kidney evaluation - eGFR measurement  10/11/2022   OPHTHALMOLOGY EXAM  02/06/2023 (Originally 07/30/2021)   COVID-19 Vaccine (8 - 2023-24 season) 02/22/2023 (Originally 11/25/2022)   Pneumonia Vaccine 24+ Years old (1 of 1 - PCV) 02/07/2024 (Originally 10/21/2009)   HEMOGLOBIN A1C  04/29/2023   Diabetic kidney evaluation - Urine ACR  10/30/2023   FOOT EXAM  10/30/2023   Medicare Annual Wellness (AWV)  02/07/2024   DTaP/Tdap/Td (2 - Td or Tdap) 04/10/2026   INFLUENZA VACCINE  Completed   DEXA SCAN  Completed   Hepatitis C Screening  Completed   Zoster Vaccines- Shingrix  Completed   HPV VACCINES  Aged Out   COLONOSCOPY (Pts 45-58yr Insurance coverage will need to be confirmed)  Discontinued    Health Maintenance  Health Maintenance Due  Topic Date Due   Diabetic kidney evaluation - eGFR measurement  10/11/2022    Colorectal cancer screening: No longer required.   Mammogram status: No longer required due to Age.  Bone Density status: Completed 08/11/19. Results reflect: Bone density results: OSTEOPOROSIS. Repeat every   years.  Lung Cancer Screening: (Low Dose CT Chest recommended if Age 79-80years, 30 pack-year currently smoking OR have quit w/in 15years.) does not qualify.     Additional Screening:  Hepatitis C Screening: does qualify; Completed 04/26/21  Vision Screening: Recommended annual ophthalmology exams for early detection of glaucoma and other disorders of the eye. Is the patient up to date with their annual eye exam?  Yes  Who is the provider or what is the name of the office in which the patient attends annual eye exams? Dr SManuella GhaziIf pt  is not established with a provider, would they like to be referred to a provider to establish care? No .   Dental Screening: Recommended annual dental exams for proper oral hygiene  Community Resource Referral / Chronic Care Management:  CRR required this visit?  No   CCM required this visit?  No      Plan:     I have personally reviewed and noted the  following in the patient's chart:   Medical and social history Use of alcohol, tobacco or illicit drugs  Current medications and supplements including opioid prescriptions. Patient is not currently taking opioid prescriptions. Functional ability and status Nutritional status Physical activity Advanced directives List of other physicians Hospitalizations, surgeries, and ER visits in previous 12 months Vitals Screenings to include cognitive, depression, and falls Referrals and appointments  In addition, I have reviewed and discussed with patient certain preventive protocols, quality metrics, and best practice recommendations. A written personalized care plan for preventive services as well as general preventive health recommendations were provided to patient.     Criselda Peaches, LPN   [id number]   Nurse Notes: Patient due Diabetic kidney evaluation-eGFR measurement

## 2023-02-09 ENCOUNTER — Other Ambulatory Visit (HOSPITAL_COMMUNITY): Payer: Self-pay

## 2023-02-09 ENCOUNTER — Ambulatory Visit (INDEPENDENT_AMBULATORY_CARE_PROVIDER_SITE_OTHER): Payer: PPO | Admitting: Family Medicine

## 2023-02-09 VITALS — BP 133/66 | HR 74 | Temp 98.0°F | Ht 65.0 in | Wt 184.8 lb

## 2023-02-09 DIAGNOSIS — E1169 Type 2 diabetes mellitus with other specified complication: Secondary | ICD-10-CM | POA: Diagnosis not present

## 2023-02-09 DIAGNOSIS — Z780 Asymptomatic menopausal state: Secondary | ICD-10-CM

## 2023-02-09 DIAGNOSIS — E782 Mixed hyperlipidemia: Secondary | ICD-10-CM | POA: Diagnosis not present

## 2023-02-09 DIAGNOSIS — I10 Essential (primary) hypertension: Secondary | ICD-10-CM

## 2023-02-09 DIAGNOSIS — L659 Nonscarring hair loss, unspecified: Secondary | ICD-10-CM

## 2023-02-09 DIAGNOSIS — R0789 Other chest pain: Secondary | ICD-10-CM | POA: Diagnosis not present

## 2023-02-09 DIAGNOSIS — Z23 Encounter for immunization: Secondary | ICD-10-CM

## 2023-02-09 LAB — T4, FREE: Free T4: 0.84 ng/dL (ref 0.60–1.60)

## 2023-02-09 LAB — LIPID PANEL
Cholesterol: 182 mg/dL (ref 0–200)
HDL: 68 mg/dL (ref >39.00)
LDL Cholesterol: 81 mg/dL (ref 0–99)
NonHDL: 114.48
Total CHOL/HDL Ratio: 3
Triglycerides: 167 mg/dL — ABNORMAL HIGH (ref 0.0–149.0)
VLDL: 33.4 mg/dL (ref 0.0–40.0)

## 2023-02-09 LAB — COMPREHENSIVE METABOLIC PANEL
ALT: 20 U/L (ref 0–35)
AST: 15 U/L (ref 0–37)
Albumin: 4.2 g/dL (ref 3.5–5.2)
Alkaline Phosphatase: 75 U/L (ref 39–117)
BUN: 17 mg/dL (ref 6–23)
CO2: 30 mEq/L (ref 19–32)
Calcium: 9.8 mg/dL (ref 8.4–10.5)
Chloride: 99 mEq/L (ref 96–112)
Creatinine, Ser: 0.9 mg/dL (ref 0.40–1.20)
GFR: 61.32 mL/min (ref >60.00)
Glucose, Bld: 186 mg/dL — ABNORMAL HIGH (ref 70–99)
Potassium: 4.4 mEq/L (ref 3.5–5.1)
Sodium: 138 mEq/L (ref 135–145)
Total Bilirubin: 0.5 mg/dL (ref 0.2–1.2)
Total Protein: 6.8 g/dL (ref 6.0–8.3)

## 2023-02-09 LAB — CBC WITH DIFFERENTIAL/PLATELET
Basophils Absolute: 0.1 10*3/uL (ref 0.0–0.1)
Basophils Relative: 0.9 % (ref 0.0–3.0)
Eosinophils Absolute: 0.4 10*3/uL (ref 0.0–0.7)
Eosinophils Relative: 4.8 % (ref 0.0–5.0)
HCT: 40.6 % (ref 36.0–46.0)
Hemoglobin: 13.7 g/dL (ref 12.0–15.0)
Lymphocytes Relative: 22.1 % (ref 12.0–46.0)
Lymphs Abs: 2 10*3/uL (ref 0.7–4.0)
MCHC: 33.6 g/dL (ref 30.0–36.0)
MCV: 82.4 fl (ref 78.0–100.0)
Monocytes Absolute: 0.8 10*3/uL (ref 0.1–1.0)
Monocytes Relative: 8.7 % (ref 3.0–12.0)
Neutro Abs: 5.9 10*3/uL (ref 1.4–7.7)
Neutrophils Relative %: 63.5 % (ref 43.0–77.0)
Platelets: 315 10*3/uL (ref 150.0–400.0)
RBC: 4.93 Mil/uL (ref 3.87–5.11)
RDW: 16 % — ABNORMAL HIGH (ref 11.5–15.5)
WBC: 9.2 10*3/uL (ref 4.0–10.5)

## 2023-02-09 LAB — TSH: TSH: 2 u[IU]/mL (ref 0.35–5.50)

## 2023-02-09 LAB — HEMOGLOBIN A1C: Hgb A1c MFr Bld: 9.1 % — ABNORMAL HIGH (ref 4.6–6.5)

## 2023-02-09 NOTE — Patient Instructions (Addendum)
You can get 1 dose of the Prevnar 20 (PCV 20) pneumonia vaccine to ensure you are up-to-date on your pneumonia vaccines.  Your EKG looks normal.  We will obtain labs and still place referral to cardiology for a Holter monitor.  A referral was placed for bone density scan.  You should receive a phone call about scheduling this appointment.  Marland Kitchen

## 2023-02-09 NOTE — Progress Notes (Signed)
Established Patient Office Visit   Subjective  Patient ID: Tara Guerrero, female    DOB: [birth year]  Age: 77 y.o. MRN: [id number]  Chief Complaint  Patient presents with   Chest Pain    Pt reports she had 2 episode of chest pain. 1st one was 6 wks and 2nd was 2-3wks ago. Denied of having chest pain right now. Denied lightheaded, SOB, dizzy.     Patient is a 77 year old female who presents concerns.  Patient endorses 2 episodes of chest pressure.  Episodes 6 weeks ago while at church while standing.  Patient sat down and she felt dizzy.  The second episode was 3 weeks ago when patient was lying in bed.  Patient states with both episodes she had mid sub-sternal chest pressure with pressure in right arm and right upper back.  Patient has difficulty recalling details about the events such as duration of symptoms but states it felt like an ache 9/10 discomfort.  Patient recalls heart rate increased during episodes.  Denies sweating, nausea, vomiting.  During second episode patient took an aspirin.  Patient also mentions her phone alerting her to an increase in heart rate.  Patient notes hair thinning over the last 2 years but worse in the last 6 weeks.  States her beautician noticed increased thinning between her appointments.  Patient denies changes in products.  Patient endorses having to pneumonia vaccines at least 18 years ago.  Does not recall the names of vaccines.       ROS Negative unless stated above    Objective:     BP 133/66 (BP Location: Right Arm, Patient Position: Sitting, Cuff Size: Large)   Pulse 74   Temp 98 F (36.7 C) (Oral)   Ht 5' 5"$  (1.651 m)   Wt 184 lb 12.8 oz (83.8 kg)   SpO2 97%   BMI 30.75 kg/m    Physical Exam Constitutional:      General: She is not in acute distress.    Appearance: Normal appearance.  HENT:     Head: Normocephalic and atraumatic.     Right Ear: Tympanic membrane, ear canal and external ear normal.     Left Ear: Tympanic  membrane, ear canal and external ear normal.     Nose: Nose normal.     Mouth/Throat:     Mouth: Mucous membranes are moist.     Pharynx: No oropharyngeal exudate or posterior oropharyngeal erythema.  Eyes:     General: No scleral icterus.    Extraocular Movements: Extraocular movements intact.     Conjunctiva/sclera: Conjunctivae normal.     Pupils: Pupils are equal, round, and reactive to light.  Neck:     Thyroid: No thyromegaly.  Cardiovascular:     Rate and Rhythm: Normal rate and regular rhythm.     Heart sounds: Normal heart sounds. No murmur heard.    No friction rub. No gallop.     Comments: No carotid bruit bilaterally. Pulmonary:     Effort: Pulmonary effort is normal. No respiratory distress.     Breath sounds: Normal breath sounds. No wheezing, rhonchi or rales.  Abdominal:     General: Bowel sounds are normal.     Palpations: Abdomen is soft.     Tenderness: There is no abdominal tenderness.  Musculoskeletal:        General: No deformity. Normal range of motion.  Lymphadenopathy:     Cervical: No cervical adenopathy.  Skin:    General: Skin is warm and  dry.     Findings: No lesion.     Comments: Central thinning of hair on scalp  Neurological:     General: No focal deficit present.     Mental Status: She is alert and oriented to person, place, and time.  Psychiatric:        Mood and Affect: Mood normal.        Thought Content: Thought content normal.      No results found for any visits on 02/09/23.    Assessment & Plan:  Essential hypertension -Controlled -Continue current medications verapamil 180 mg and ramipril 10 mg -     TSH -     T4, free -     Comprehensive metabolic panel  Type 2 diabetes mellitus with other specified complication, without long-term current use of insulin (HCC) -Continue Synjardy 12.04-999 mg twice daily. -Continue ACE I and statin -Pt to have yearly eye exam scheduled. -     Hemoglobin A1c -     Lipid panel  Mixed  hyperlipidemia -Continue Lipitor 20 mg daily -     Lipid panel  Chest pressure -Resolved -EKG this visit with NSR, HR 70, nonspecific T wave inversion in V2, nonspecific T wave inversion in V3.  EKG from 10/11/2021 reviewed for comparison and normal. -Obtain labs -Referral to cardiology placed.  Consider Holter monitor. -     TSH -     T4, free -     Comprehensive metabolic panel -     Lipid panel -     CBC with Differential/Platelet -     EKG 12-Lead -     Ambulatory referral to Cardiology  Hair thinning -     TSH -     T4, free -     CBC with Differential/Platelet  Asymptomatic postmenopausal state -     DG Bone Density; Future  Need for pneumococcal vaccine -Patient reports prior history of pneumonia vaccines 18+ years ago -     Pneumococcal conjugate vaccine 20-valent   Return in about 3 weeks (around 03/02/2023), or if symptoms worsen or fail to improve.   Billie Ruddy, MD

## 2023-02-11 ENCOUNTER — Telehealth: Payer: Self-pay | Admitting: Family Medicine

## 2023-02-11 NOTE — Telephone Encounter (Signed)
Needs confirmation of diagnosis of either diabetes or congestive heart disease. Can call and give verbal confirmation.

## 2023-02-11 NOTE — Telephone Encounter (Signed)
Receiver unable to help due to not knowing who called. No name was left on my end as well. Receiver will send a message out for advise. Will wait for a return call.

## 2023-02-11 NOTE — Telephone Encounter (Signed)
Received a call back from [facility] and gave her Dx Code E11.9. No further action needed.

## 2023-02-11 NOTE — Telephone Encounter (Signed)
Original called stated to just call customer service and anyone would be able to assist. Currently have Renee on the line requesting to speak with Tillie Rung.

## 2023-02-12 ENCOUNTER — Other Ambulatory Visit (HOSPITAL_COMMUNITY): Payer: Self-pay

## 2023-02-12 MED ORDER — GLIPIZIDE 5 MG PO TABS
5.0000 mg | ORAL_TABLET | Freq: Every day | ORAL | 0 refills | Status: DC
  Filled 2023-02-12: qty 90, 90d supply, fill #0

## 2023-02-12 NOTE — Addendum Note (Signed)
Addended by: Agnes Lawrence on: 02/12/2023 10:15 AM   Modules accepted: Orders

## 2023-02-13 ENCOUNTER — Other Ambulatory Visit: Payer: Self-pay | Admitting: Family Medicine

## 2023-02-13 ENCOUNTER — Other Ambulatory Visit (HOSPITAL_COMMUNITY): Payer: Self-pay

## 2023-02-13 MED ORDER — RAMIPRIL 10 MG PO CAPS
10.0000 mg | ORAL_CAPSULE | Freq: Every day | ORAL | 3 refills | Status: DC
  Filled 2023-02-13: qty 100, 100d supply, fill #0
  Filled 2023-05-27: qty 100, 100d supply, fill #1
  Filled 2023-09-07: qty 100, 100d supply, fill #0
  Filled 2023-12-31: qty 100, 100d supply, fill #1

## 2023-02-13 NOTE — Telephone Encounter (Signed)
Pt called to request a refill of the following:  ramipril (ALTACE) 10 MG capsule   Pt states she only has about a week's worth.  LOV:  02/09/23  Please advise.  Please send to:  [place] Phone: [phone]  Fax: [phone]

## 2023-02-14 ENCOUNTER — Other Ambulatory Visit (HOSPITAL_COMMUNITY): Payer: Self-pay

## 2023-02-22 NOTE — Progress Notes (Unsigned)
Primary Care Provider: Billie Ruddy, MD [place] Cardiologist: None Electrophysiologist: None  Clinic Note: No chief complaint on file.   ===================================  ASSESSMENT/PLAN   Problem List Items Addressed This Visit       Cardiology Problems   Essential hypertension - Primary (Chronic)     Other   Controlled type 2 diabetes mellitus without complication, without long-term current use of insulin (HCC) (Chronic)    ===================================  HPI:    Tara Guerrero is a 77 y.o. female with PMH notable for HTN, HLD, DM-2 who is being seen today for the evaluation of 2 Episodes of Chest Pain at the request of Billie Ruddy, MD.  Tara Guerrero was just seen by Dr. Volanda Napoleon on February 12 with complaints of 2 episodes of chest pain: First 1 occurred 6 weeks prior to the visit and the second 1 was 2 to 3 weeks prior.  No active chest pain at the time of the visit.  No lightheadedness or dizziness.  No dyspnea.  She describes symptoms of substernal chest pressure radiating to the right arm and right upper back.  Unable to tell duration but noted that the pain felt like an ache 9/10.  Also noted that her heart was fast during those episodes.  On both occasions her phone alerted her to increase her heart rate. => Lipid panel, A1c, CMP and thyroid panel ordered.  EKG showed nonspecific T wave version in V2 and V3. Episode 1 -> ~8 weeks ago at church while standing with her chest pressure.  Felt somewhat dizzy, sat down Episode 2 -> ~5 weeks ago while laying in bed; took an aspirin.  Recent Hospitalizations: None   Reviewed  CV studies:    The following studies were reviewed today: (if available, images/films reviewed: From Epic Chart or Care Everywhere) None:  Interval History:   Tara Guerrero   CV Review of Symptoms (Summary): {roscv:310661}  REVIEWED OF SYSTEMS   Review of Systems  Constitutional:  Negative for malaise/fatigue and  weight loss.  HENT:  Negative for congestion and nosebleeds.        Hair loss  Respiratory:  Negative for cough and shortness of breath.   Gastrointestinal:  Negative for blood in stool and melena.  Genitourinary:  Negative for dysuria and hematuria.  Musculoskeletal:  Negative for joint pain and myalgias.  Neurological:  Negative for dizziness and focal weakness.  Endo/Heme/Allergies:  Negative for environmental allergies. Does not bruise/bleed easily.  Psychiatric/Behavioral:  Negative for depression and memory loss. The patient is not nervous/anxious and does not have insomnia.    I have reviewed and (if needed) personally updated the patient's problem list, medications, allergies, past medical and surgical history, social and family history.   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   Depression    Diabetes mellitus without complication ([place])    Diverticulosis    Hyperlipidemia    Hyperplastic colon polyp 11/2014   Hypertension    Sleep apnea     PAST SURGICAL HISTORY   Past Surgical History:  Procedure Laterality Date   ABDOMINAL HYSTERECTOMY  1976   s/p uterine CA   BREAST EXCISIONAL BIOPSY Bilateral    BREAST SURGERY  1995   L breast node removal   BREAST SURGERY     R breast biopsy   CHOLECYSTECTOMY N/A 10/17/2021   Procedure: LAPAROSCOPIC CHOLECYSTECTOMY;  Surgeon: Erroll Luna, MD;  Location: [place];  Service: General;  Laterality: N/A;   EYE MUSCLE SURGERY  2019   EYE SURGERY  2017   bilateral cataract surgery   GALLBLADDER SURGERY     JOINT REPLACEMENT  2004   bilateral knee replacements   TONSILLECTOMY  1950    Immunization History  Administered Date(s) Administered   Fluad Quad(high Dose 65+) 09/23/2019   Influenza, High Dose Seasonal PF 09/30/2022   Influenza-Unspecified 09/27/2015, 10/18/2020   Moderna Sars-Covid-2 Vaccination 01/29/2020, 02/26/2020, 10/18/2020   PFIZER Comirnaty(Gray  Top)Covid-19 Tri-Sucrose Vaccine 04/01/2021, 09/16/2021   PFIZER(Purple Top)SARS-COV-2 Vaccination 08/16/2021, 09/30/2022   PNEUMOCOCCAL CONJUGATE-20 02/09/2023   Respiratory Syncytial Virus Vaccine,Recomb Aduvanted(Arexvy) 09/30/2022   Tdap 04/10/2016   Zoster Recombinat (Shingrix) 04/05/2017, 09/03/2017    MEDICATIONS/ALLERGIES   No outpatient medications have been marked as taking for the 02/23/23 encounter (Appointment) with Leonie Man, MD.    Allergies  Allergen Reactions   Codeine Other (See Comments)    Agitation     SOCIAL HISTORY/FAMILY HISTORY   Reviewed in Epic:   Social History   Tobacco Use   Smoking status: Former   Smokeless tobacco: Never  Vaping Use   Vaping Use: Never used  Substance Use Topics   Alcohol use: Yes    Alcohol/week: 1.0 standard drink of alcohol    Types: 1 Glasses of wine per week    Comment: Social   Drug use: Never   Social History   Social History Narrative   01/24/2019: Lives with granddaughter and dog.    Retired    Family History  Problem Relation Age of Onset   Cancer Mother    Hyperlipidemia Mother    Hypertension Mother    Stroke Mother    Breast cancer Mother        diagnosed in her 52's   Alcohol abuse Father    Cancer Sister    Hyperlipidemia Sister    Hypertension Sister    Stroke Sister    Breast cancer Sister        diagnosed in her 60's   Mental illness Maternal Grandmother    Hypertension Maternal Grandmother    Hyperlipidemia Maternal Grandmother    Mental retardation Maternal Grandmother    Hearing loss Maternal Grandfather    Stroke Maternal Grandfather    Alcohol abuse Paternal Grandmother    Alcohol abuse Paternal Grandfather    Breast cancer Cousin        diagnosed in her 31's   Breast cancer Sister 36   Breast cancer Other        diagnosed late 20's    OBJCTIVE -PE, EKG, labs   Wt Readings from Last 3 Encounters:  02/09/23 184 lb 12.8 oz (83.8 kg)  02/06/23 185 lb (83.9 kg)   10/29/22 183 lb (83 kg)    Physical Exam: There were no vitals taken for this visit. Physical Exam Vitals reviewed.  Constitutional:      General: She is not in acute distress.    Appearance: Normal appearance. She is normal weight. She is not ill-appearing or toxic-appearing.  HENT:     Head: Normocephalic and atraumatic.  Neck:     Vascular: No carotid bruit or JVD.  Cardiovascular:     Rate and Rhythm: Normal rate and regular rhythm. No extrasystoles are present.    Chest Wall: PMI is not displaced.     Pulses: Normal pulses and intact distal pulses. No decreased pulses.     Heart sounds: Normal heart sounds,  S1 normal and S2 normal. No murmur heard.    No friction rub. No gallop.  Pulmonary:     Effort: Pulmonary effort is normal. No respiratory distress.     Breath sounds: No wheezing, rhonchi or rales.  Chest:     Chest wall: No tenderness.  Abdominal:     General: Abdomen is flat. Bowel sounds are normal. There is no distension.     Palpations: Abdomen is soft. There is no mass.     Tenderness: There is no abdominal tenderness. There is no guarding or rebound.     Comments: No HSM or bruit  Musculoskeletal:        General: No swelling. Normal range of motion.     Cervical back: Normal range of motion and neck supple.  Skin:    General: Skin is warm and dry.  Neurological:     General: No focal deficit present.     Mental Status: She is alert and oriented to person, place, and time.     Gait: Gait normal.  Psychiatric:        Mood and Affect: Mood normal.        Behavior: Behavior normal.        Thought Content: Thought content normal.        Judgment: Judgment normal.     Adult ECG Report  Rate: *** ;  Rhythm: {rhythm:17366};   Narrative Interpretation: ***  Recent Labs: Reviewed Lab Results  Component Value Date   CHOL 182 02/09/2023   HDL 68.00 02/09/2023   LDLCALC 81 02/09/2023   TRIG 167.0 (H) 02/09/2023   CHOLHDL 3 02/09/2023   Lab Results   Component Value Date   CREATININE 0.90 02/09/2023   BUN 17 02/09/2023   NA 138 02/09/2023   K 4.4 02/09/2023   CL 99 02/09/2023   CO2 30 02/09/2023      Latest Ref Rng & Units 02/09/2023   11:32 AM 10/11/2021    3:19 PM 08/26/2021   12:13 PM  CBC  WBC 4.0 - 10.5 K/uL 9.2  10.4  7.5   Hemoglobin 12.0 - 15.0 g/dL 13.7  14.8  14.9   Hematocrit 36.0 - 46.0 % 40.6  44.7  45.3   Platelets 150.0 - 400.0 K/uL 315.0  290  268     Lab Results  Component Value Date   HGBA1C 9.1 (H) 02/09/2023   Lab Results  Component Value Date   TSH 2.00 02/09/2023    ================================================== I spent a total of *** minutes with the patient spent in direct patient consultation.  Additional time spent with chart review  / charting (studies, outside notes, etc): *** min (12) Total Time: *** min  Current medicines are reviewed at length with the patient today.  (+/- concerns) ***  Notice: This dictation was prepared with Dragon dictation along with smart phrase technology. Any transcriptional errors that result from this process are unintentional and may not be corrected upon review.   Studies Ordered:  No orders of the defined types were placed in this encounter.  No orders of the defined types were placed in this encounter.   Patient Instructions / Medication Changes & Studies & Tests Ordered   There are no Patient Instructions on file for this visit.    Leonie Man, MD, MS Glenetta Hew, M.D., M.S. Interventional Cardiologist  [place]  Pager # [phone] Phone # [phone] [address] [place], [place] [address]   Thank you for  choosing [employer] at [employer]!!

## 2023-02-23 ENCOUNTER — Ambulatory Visit: Payer: PPO

## 2023-02-23 ENCOUNTER — Encounter: Payer: Self-pay | Admitting: Cardiology

## 2023-02-23 ENCOUNTER — Other Ambulatory Visit: Payer: Self-pay | Admitting: Cardiology

## 2023-02-23 ENCOUNTER — Ambulatory Visit: Payer: PPO | Attending: Cardiology

## 2023-02-23 ENCOUNTER — Ambulatory Visit: Payer: PPO | Attending: Cardiology | Admitting: Cardiology

## 2023-02-23 VITALS — BP 134/68 | HR 85 | Ht 65.0 in | Wt 183.0 lb

## 2023-02-23 DIAGNOSIS — I10 Essential (primary) hypertension: Secondary | ICD-10-CM | POA: Diagnosis not present

## 2023-02-23 DIAGNOSIS — E119 Type 2 diabetes mellitus without complications: Secondary | ICD-10-CM | POA: Diagnosis not present

## 2023-02-23 DIAGNOSIS — R079 Chest pain, unspecified: Secondary | ICD-10-CM | POA: Diagnosis not present

## 2023-02-23 DIAGNOSIS — R002 Palpitations: Secondary | ICD-10-CM | POA: Insufficient documentation

## 2023-02-23 NOTE — Progress Notes (Unsigned)
Enrolled for [employer] to mail a ZIO AT Live Telemetry monitor to patients address on file.  Patient to wear a 28 day ZIO AT- 2 consecutive 14 day ZIO AT monitors.

## 2023-02-23 NOTE — Patient Instructions (Signed)
Medication Instructions:   No changes      Lab Work: Not needed   Testing/Procedures: You will receive monitor in  3 to 7 days   - not sure if the will send them both  or 2 weks apart Your physician has recommended that you wear a holter monitor 14 days Zio .You will receive  2 separate  units to wear 2 weeks each. . Holter monitors are medical devices that record the heart's electrical activity. Doctors most often use these monitors to diagnose arrhythmias. Arrhythmias are problems with the speed or rhythm of the heartbeat. The monitor is a small, portable device. You can wear one while you do your normal daily activities. This is usually used to diagnose what is causing palpitations/syncope (passing out).    Follow-Up: At [facility], you and your health needs are our priority.  As part of our continuing mission to provide you with exceptional heart care, we have created designated Provider Care Teams.  These Care Teams include your primary Cardiologist (physician) and Advanced Practice Providers (APPs -  Physician Assistants and Nurse Practitioners) who all work together to provide you with the care you need, when you need it.     Your next appointment:   3 month(s)  The format for your next appointment:   In Person  Provider:   Glenetta Hew, MD    Other Instructions  ZIO XT- Long Term Monitor Instructions  Your physician has requested you wear a ZIO patch monitor for 14 days x 2  ( total of 28 days) .  This is a single patch monitor. [employer] supplies one patch monitor per enrollment. Additional stickers are not available. Please do not apply patch if you will be having a Nuclear Stress Test,  Echocardiogram, Cardiac CT, MRI, or Chest Xray during the period you would be wearing the  monitor. The patch cannot be worn during these tests. You cannot remove and re-apply the  ZIO XT patch monitor.  Your ZIO patch monitor will be mailed 3 day USPS to your address on file. It  may take 3-5 days  to receive your monitor after you have been enrolled.  Once you have received your monitor, please review the enclosed instructions. Your monitor  has already been registered assigning a specific monitor serial # to you.  Billing and Patient Assistance Program Information  We have supplied [employer] with any of your insurance information on file for billing purposes. [employer] offers a sliding scale Patient Assistance Program for patients that do not have  insurance, or whose insurance does not completely cover the cost of the ZIO monitor.  You must apply for the Patient Assistance Program to qualify for this discounted rate.  To apply, please call [employer] at [phone], select option 4, select option 2, ask to apply for  Patient Assistance Program. Theodore Demark will ask your household income, and how many people  are in your household. They will quote your out-of-pocket cost based on that information.  [employer] will also be able to set up a 1-month interest-free payment plan if needed.  Applying the monitor   Shave hair from upper left chest.  Hold abrader disc by orange tab. Rub abrader in 40 strokes over the upper left chest as  indicated in your monitor instructions.  Clean area with 4 enclosed alcohol pads. Let dry.  Apply patch as indicated in monitor instructions. Patch will be placed under collarbone on left  side of chest with arrow pointing upward.  Rub  patch adhesive wings for 2 minutes. Remove white label marked "1". Remove the white  label marked "2". Rub patch adhesive wings for 2 additional minutes.  While looking in a mirror, press and release button in center of patch. A small green light will  flash 3-4 times. This will be your only indicator that the monitor has been turned on.  Do not shower for the first 24 hours. You may shower after the first 24 hours.  Press the button if you feel a symptom. You will hear a small click. Record Date, Time and  Symptom  in the Patient Logbook.  When you are ready to remove the patch, follow instructions on the last 2 pages of Patient  Logbook. Stick patch monitor onto the last page of Patient Logbook.  Place Patient Logbook in the blue and white box. Use locking tab on box and tape box closed  securely. The blue and white box has prepaid postage on it. Please place it in the mailbox as  soon as possible. Your physician should have your test results approximately 7 days after the  monitor has been mailed back to [facility].  Call [place] at [phone] if you have questions regarding  your ZIO XT patch monitor. Call them immediately if you see an orange light blinking on your  monitor.  If your monitor falls off in less than 4 days, contact our Monitor department at [phone].  If your monitor becomes loose or falls off after 4 days call [employer] at [phone] for  suggestions on securing your monitor

## 2023-02-23 NOTE — Progress Notes (Unsigned)
Patient to wear 2 consecutive 14 day zio xt monitors. Wear 2nd monitor 03/11/23 thru 03/25/23.

## 2023-02-25 ENCOUNTER — Ambulatory Visit (INDEPENDENT_AMBULATORY_CARE_PROVIDER_SITE_OTHER): Payer: PPO

## 2023-02-25 ENCOUNTER — Encounter: Payer: Self-pay | Admitting: Cardiology

## 2023-02-25 VITALS — Ht 65.0 in | Wt 183.0 lb

## 2023-02-25 DIAGNOSIS — Z0000 Encounter for general adult medical examination without abnormal findings: Secondary | ICD-10-CM

## 2023-02-25 DIAGNOSIS — R002 Palpitations: Secondary | ICD-10-CM | POA: Diagnosis not present

## 2023-02-25 DIAGNOSIS — R079 Chest pain, unspecified: Secondary | ICD-10-CM | POA: Diagnosis not present

## 2023-02-25 NOTE — Assessment & Plan Note (Addendum)
Intermittent episodes of sharp chest discomfort which she describes as being tachycardic spells.  I suspect that these may be short little bursts of PAT but need to exclude potential runs of NSVT or bigeminy.  She is already on a decent dose of verapamil.  Will hold off on adjusting therapy until we see what the monitor shows. Since the episodes may or may not happen every week, will do back-to-back 2-week Zio patch monitor for essentially 28-day monitor.

## 2023-02-25 NOTE — Assessment & Plan Note (Signed)
BP pretty well-controlled on verapamil and ramipril.  Based on the medications, I would suspect that these have been on board for quite a long time.  Since her pressures are stable, we will continue current meds.

## 2023-02-25 NOTE — Progress Notes (Signed)
Subjective:   Tara Guerrero is a 77 y.o. female who presents for Medicare Annual (Subsequent) preventive examination.  Review of Systems    Virtual Visit via Telephone Note  I connected with  Tara Guerrero on 02/25/23 at  1:30 PM EST by telephone and verified that I am speaking with the correct person using two identifiers.  Location: Patient: Home Provider: Office Persons participating in the virtual visit: patient/Nurse Health Advisor   I discussed the limitations, risks, security and privacy concerns of performing an evaluation and management service by telephone and the availability of in person appointments. The patient expressed understanding and agreed to proceed.  Interactive audio and video telecommunications were attempted between this nurse and patient, however failed, due to patient having technical difficulties OR patient did not have access to video capability.  We continued and completed visit with audio only.  Some vital signs may be absent or patient reported.   Criselda Peaches, LPN  Cardiac Risk Factors include: advanced age (>52mn, >>50women);diabetes mellitus;hypertension     Objective:    Today's Vitals   02/25/23 1350  Weight: 183 lb (83 kg)   Body mass index is 30.45 kg/m.     02/25/2023    1:57 PM 02/06/2023   11:51 AM 02/05/2022   11:34 AM 10/17/2021   12:44 PM 10/11/2021    3:09 PM 08/26/2021   12:15 PM 01/30/2021   11:02 AM  Advanced Directives  Does Patient Have a Medical Advance Directive? Yes Yes Yes Yes No No Yes  Type of AParamedicof [place]rat[place]Living will [place]Living will [place]Living will [place]Living will   [place]Living will  Does patient want to make changes to medical advance directive? No - Patient declined No - Patient declined No - Patient declined No - Patient declined     Copy of [place]in Chart?  Yes - validated most recent copy scanned in chart (See row information) Yes - validated most recent copy scanned in chart (See row information) Yes - validated most recent copy scanned in chart (See row information) Yes - validated most recent copy scanned in chart (See row information)   No - copy requested  Would patient like information on creating a medical advance directive?     Yes (MAU/Ambulatory/Procedural Areas - Information given) No - Patient declined     Current Medications (verified) Outpatient Encounter Medications as of 02/25/2023  Medication Sig   acetaminophen (TYLENOL) 500 MG tablet Take 500 mg by mouth every 6 (six) hours as needed for moderate pain.   APPLE CIDER VINEGAR PO Take by mouth.   Ascorbic Acid (VITAMIN C) 1000 MG tablet Take 1,000 mg by mouth 2 (two) times a week.   aspirin EC 81 MG tablet Take 81 mg by mouth daily.   atorvastatin (LIPITOR) 20 MG tablet Take 1 tablet (20 mg total) by mouth daily.   Blood Glucose Monitoring Suppl (ONE TOUCH ULTRA 2) w/Device KIT Use to Check Blood glucose daily.( Dx:Z51.81)   buPROPion ER (WELLBUTRIN SR) 100 MG 12 hr tablet Take 1 tablet (100 mg total) by mouth daily.   Calcium Carb-Cholecalciferol (CALCIUM 600 + D PO) Take 1 tablet by mouth 2 (two) times a week.   cetirizine (ZYRTEC) 10 MG tablet Take 10 mg by mouth daily.   cycloSPORINE (RESTASIS) 0.05 % ophthalmic emulsion Place 1 drop into both eyes daily.   Empagliflozin-metFORMIN HCl (  SYNJARDY) 12.04-999 MG TABS Take 1 tablet by mouth 2 (two) times daily.   glipiZIDE (GLUCOTROL) 5 MG tablet Take 1 tablet by mouth daily.   Multiple Vitamin (MULTIVITAMIN WITH MINERALS) TABS tablet Take 1 tablet by mouth 2 (two) times a week.   ramipril (ALTACE) 10 MG capsule TAKE 1 CAPSULE BY MOUTH  DAILY   verapamil (CALAN-SR) 180 MG CR tablet TAKE 1 TABLET(180 MG) BY MOUTH AT BEDTIME   No facility-administered encounter medications on file as of 02/25/2023.    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   Depression    Diabetes mellitus without complication ([place])    Diverticulosis    Hyperlipidemia    Hyperplastic colon polyp 11/2014   Hypertension    Sleep apnea    Past Surgical History:  Procedure Laterality Date   ABDOMINAL HYSTERECTOMY  1976   s/p uterine CA   BREAST EXCISIONAL BIOPSY Bilateral    BREAST SURGERY  1995   L breast node removal   BREAST SURGERY     R breast biopsy   CHOLECYSTECTOMY N/A 10/17/2021   Procedure: LAPAROSCOPIC CHOLECYSTECTOMY;  Surgeon: Erroll Luna, MD;  Location: [facility];  Service: General;  Laterality: N/A;   EYE MUSCLE SURGERY  2019   EYE SURGERY  2017   bilateral cataract surgery   [place] REPLACEMENT  2004   bilateral knee replacements   TONSILLECTOMY  1950   Family History  Problem Relation Age of Onset   Cancer Mother    Hyperlipidemia Mother    Hypertension Mother    Stroke Mother    Breast cancer Mother        diagnosed in her 86's   Alcohol abuse Father    Cancer Sister    Hyperlipidemia Sister    Hypertension Sister    Stroke Sister    Breast cancer Sister        diagnosed in her 57's   Mental illness Maternal Grandmother    Hypertension Maternal Grandmother    Hyperlipidemia Maternal Grandmother    Mental retardation Maternal Grandmother    Hearing loss Maternal Grandfather    Stroke Maternal Grandfather    Alcohol abuse Paternal Grandmother    Alcohol abuse Paternal Grandfather    Breast cancer Cousin        diagnosed in her 37's   Breast cancer Sister 60   Breast cancer Other        diagnosed late 20's   Social History   Socioeconomic History   Marital status: Single    Spouse name: Not on file   Number of children: 1   Years of education: 14   Highest education level: Associate degree: occupational, Hotel manager, or vocational program  Occupational History   Occupation: retired  Tobacco Use   Smoking  status: Former   Smokeless tobacco: Never  Scientific laboratory technician Use: Never used  Substance and Sexual Activity   Alcohol use: Yes    Alcohol/week: 1.0 standard drink of alcohol    Types: 1 Glasses of wine per week    Comment: Social   Drug use: Never   Sexual activity: Not Currently  Other Topics Concern   Not on file  Social History Narrative   01/24/2019: Lives with granddaughter and dog.    Retired       [employer] her dog for 5 times a day.  Usually does  about flat ground.  Does not take stairs because of knee pain from bilateral knee replacements.   Social Determinants of Health   Financial Resource Strain: Low Risk  (02/25/2023)   Overall Financial Resource Strain (CARDIA)    Difficulty of Paying Living Expenses: Not hard at all  Food Insecurity: No Food Insecurity (02/25/2023)   Hunger Vital Sign    Worried About Running Out of Food in the Last Year: Never true    Ran Out of Food in the Last Year: Never true  Transportation Needs: No Transportation Needs (02/25/2023)   PRAPARE - Hydrologist (Medical): No    Lack of Transportation (Non-Medical): No  Physical Activity: Sufficiently Active (02/25/2023)   Exercise Vital Sign    Days of Exercise per Week: 7 days    Minutes of Exercise per Session: 30 min  Recent Concern: Physical Activity - Insufficiently Active (02/09/2023)   Exercise Vital Sign    Days of Exercise per Week: 5 days    Minutes of Exercise per Session: 20 min  Stress: No Stress Concern Present (02/25/2023)   [place]    Feeling of Stress : Not at all  Social Connections: Moderately Integrated (02/25/2023)   Social Connection and Isolation Panel [NHANES]    Frequency of Communication with Friends and Family: More than three times a week    Frequency of Social Gatherings with Friends and Family: More than three times a week    Attends Religious Services: More than 4 times  per year    Active Member of [employer] or Organizations: Yes    Attends Music therapist: More than 4 times per year    Marital Status: Divorced    Tobacco Counseling Counseling given: Not Answered   Clinical Intake:  Pre-visit preparation completed: Yes  Nutrition Risk Assessment:  Has the patient had any N/V/D within the last 2 months?  No  Does the patient have any non-healing wounds?  No  Has the patient had any unintentional weight loss or weight gain?  No   Diabetes:  Is the patient diabetic?  Yes  If diabetic, was a CBG obtained today?  Yes CBG 136 Taken by patient Did the patient bring in their glucometer from home?  No  How often do you monitor your CBG's? 2 X Daily.   Financial Strains and Diabetes Management:  Are you having any financial strains with the device, your supplies or your medication? No .  Does the patient want to be seen by Chronic Care Management for management of their diabetes?  No  Would the patient like to be referred to a Nutritionist or for Diabetic Management?  No   Diabetic Exams:  Diabetic Eye Exam: Completed No. Overdue for diabetic eye exam. Pt has been advised about the importance in completing this exam. A referral has been placed today. Message sent to referral coordinator for scheduling purposes. Advised pt to expect a call from office referred to regarding appt.  Diabetic Foot Exam: Completed No. Pt has been advised about the importance in completing this exam. Pt is scheduled for diabetic foot exam on Followed by PCP.  Pain : No/denies pain     BMI - recorded: 30.45 Nutritional Status: BMI > 30  Obese Nutritional Risks: None Diabetes: Yes CBG done?: Yes (CBG 136 Taken by patient) CBG resulted in Enter/ Edit results?: Yes Did pt. bring in CBG monitor from home?: No  How often  do you need to have someone help you when you read instructions, pamphlets, or other written materials from your doctor or pharmacy?: 1 -  Never  Diabetic?  Yes  Interpreter Needed?: No  Information entered by :: Rise Paganini ilson LPN   Activities of Daily Living    02/25/2023    1:55 PM 02/24/2023    2:06 PM  In your present state of health, do you have any difficulty performing the following activities:  Hearing? 0 0  Vision? 0 0  Difficulty concentrating or making decisions? 0 0  Walking or climbing stairs? 0 0  Dressing or bathing? 0 0  Doing errands, shopping? 0 0  Preparing Food and eating ? N N  Using the Toilet? N N  In the past six months, have you accidently leaked urine? Y N  Comment Wears pads. Followed by PCP   Do you have problems with loss of bowel control? N N  Managing your Medications? N N  Managing your Finances? N N  Housekeeping or managing your Housekeeping? N N    Patient Care Team: Billie Ruddy, MD as PCP - General (Family Medicine) Leonie Man, MD as PCP - Cardiology (Cardiology)  Indicate any recent Medical Services you may have received from other than [facility] providers in the past year (date may be approximate).     Assessment:   This is a routine wellness examination for Anwita.  Hearing/Vision screen Hearing Screening - Comments:: Denies hearing difficulties   Vision Screening - Comments:: Wears rx glasses - up to date with routine eye exams with  Dr Manuella Ghazi  Dietary issues and exercise activities discussed: Exercise limited by: None identified   Goals Addressed               This Visit's Progress     Patient stated (pt-stated)        I want to get more active in the church       Depression Screen    02/25/2023    1:54 PM 02/09/2023   10:37 AM 02/06/2023   11:47 AM 04/03/2022    9:01 AM 02/05/2022   11:29 AM 01/30/2021   11:00 AM 01/26/2020   11:01 AM  PHQ 2/9 Scores  PHQ - 2 Score 0 0 0 0 0 0 0  PHQ- 9 Score 0 1  0       Fall Risk    02/25/2023    1:56 PM 02/24/2023    2:06 PM 02/09/2023   10:37 AM 02/09/2023    9:45 AM 02/06/2023   11:50 AM  [place]  in the past year? 0 0 0 0 0  Number falls in past yr: 0 0 0  0  Injury with Fall? 0 0 0  0  Risk for fall due to : No Fall Risks  No Fall Risks  No Fall Risks  Follow up Falls prevention discussed  Falls evaluation completed  Falls prevention discussed    FALL RISK PREVENTION PERTAINING TO THE HOME:  Any stairs in or around the home? No  If so, are there any without handrails? No  Home free of loose throw rugs in walkways, pet beds, electrical cords, etc? Yes  Adequate lighting in your home to reduce risk of falls? Yes   ASSISTIVE DEVICES UTILIZED TO PREVENT FALLS:  Life alert? No  Use of a cane, walker or w/c? No  Grab bars in the bathroom? No  Shower chair or bench in shower?  No  Elevated toilet seat or a handicapped toilet? Yes  TIMED UP AND GO:  Was the test performed? No . Audio Visit   Cognitive Function:        02/25/2023    1:57 PM 02/06/2023   11:51 AM 02/05/2022   11:34 AM 01/30/2021   11:08 AM 01/26/2020   11:02 AM  6CIT Screen  What Year? 0 points 0 points 0 points 0 points 0 points  What month? 0 points 0 points 0 points 0 points 0 points  What time? 0 points 0 points 0 points  0 points  Count back from 20 0 points 0 points 0 points 0 points 0 points  Months in reverse 0 points 0 points 0 points 0 points 0 points  Repeat phrase 0 points 0 points 0 points 0 points 0 points  Total Score 0 points 0 points 0 points  0 points    Immunizations Immunization History  Administered Date(s) Administered   Fluad Quad(high Dose 65+) 09/23/2019   Influenza, High Dose Seasonal PF 09/30/2022   Influenza-Unspecified 09/27/2015, 10/18/2020   Moderna Sars-Covid-2 Vaccination 01/29/2020, 02/26/2020, 10/18/2020   PFIZER Comirnaty(Gray Top)Covid-19 Tri-Sucrose Vaccine 04/01/2021, 09/16/2021   PFIZER(Purple Top)SARS-COV-2 Vaccination 08/16/2021, 09/30/2022   PNEUMOCOCCAL CONJUGATE-20 02/09/2023   Respiratory Syncytial Virus Vaccine,Recomb Aduvanted(Arexvy) 09/30/2022   Tdap  04/10/2016   Zoster Recombinat (Shingrix) 04/05/2017, 09/03/2017    TDAP status: Up to date  Flu Vaccine status: Up to date  Pneumococcal vaccine status: Up to date  Covid-19 vaccine status: Completed vaccines  Qualifies for Shingles Vaccine? Yes   Zostavax completed Yes   Shingrix Completed?: Yes  Screening Tests Health Maintenance  Topic Date Due   OPHTHALMOLOGY EXAM  02/25/2023 (Originally 07/30/2021)   COVID-19 Vaccine (8 - 2023-24 season) 03/13/2023 (Originally 11/25/2022)   HEMOGLOBIN A1C  08/10/2023   Diabetic kidney evaluation - Urine ACR  10/30/2023   FOOT EXAM  10/30/2023   Diabetic kidney evaluation - eGFR measurement  02/10/2024   Medicare Annual Wellness (AWV)  02/26/2024   DTaP/Tdap/Td (2 - Td or Tdap) 04/10/2026   Pneumonia Vaccine 52+ Years old  Completed   INFLUENZA VACCINE  Completed   DEXA SCAN  Completed   Hepatitis C Screening  Completed   Zoster Vaccines- Shingrix  Completed   HPV VACCINES  Aged Out   COLONOSCOPY (Pts 45-66yr Insurance coverage will need to be confirmed)  Discontinued    Health Maintenance  There are no preventive care reminders to display for this patient.   Colorectal cancer screening: No longer required.   Mammogram status: No longer required due to Age.  Bone Density status: Ordered 02/09/23. Pt provided with contact info and advised to call to schedule appt.  Lung Cancer Screening: (Low Dose CT Chest recommended if Age 79-80years, 30 pack-year currently smoking OR have quit w/in 15years.) does not qualify.    Additional Screening:  Hepatitis C Screening: does qualify; Completed 04/26/21  Vision Screening: Recommended annual ophthalmology exams for early detection of glaucoma and other disorders of the eye. Is the patient up to date with their annual eye exam?  Yes  Who is the provider or what is the name of the office in which the patient attends annual eye exams? Dr SManuella GhaziIf pt is not established with a provider, would  they like to be referred to a provider to establish care? No .   Dental Screening: Recommended annual dental exams for proper oral hygiene  Community Resource Referral / Chronic Care Management:  CRR required this visit?  No   CCM required this visit?  No      Plan:     I have personally reviewed and noted the following in the patient's chart:   Medical and social history Use of alcohol, tobacco or illicit drugs  Current medications and supplements including opioid prescriptions. Patient is not currently taking opioid prescriptions. Functional ability and status Nutritional status Physical activity Advanced directives List of other physicians Hospitalizations, surgeries, and ER visits in previous 12 months Vitals Screenings to include cognitive, depression, and falls Referrals and appointments  In addition, I have reviewed and discussed with patient certain preventive protocols, quality metrics, and best practice recommendations. A written personalized care plan for preventive services as well as general preventive health recommendations were provided to patient.     Criselda Peaches, LPN   [id number]   Nurse Notes: None

## 2023-02-25 NOTE — Assessment & Plan Note (Addendum)
A1c was 9.1 which is somewhat concerning, thankfully lipids are relatively well-controlled with an LDL of 81.  Is on glipizide and Synjardy.   This is in addition to atorvastatin for hyperlipidemia.  LDL of 81 is reasonably at goal for now.

## 2023-02-25 NOTE — Patient Instructions (Addendum)
Tara Guerrero , Thank you for taking time to come for your Medicare Wellness Visit. I appreciate your ongoing commitment to your health goals. Please review the following plan we discussed and let me know if I can assist you in the future.   These are the goals we discussed:  Goals       No current goals (pt-stated)      Patient Stated      Avoid covid.      Patient Stated      Lose weight       Patient stated (pt-stated)      I want to get more active in the church        This is a list of the screening recommended for you and due dates:  Health Maintenance  Topic Date Due   Eye exam for diabetics  02/25/2023*   COVID-19 Vaccine (8 - 2023-24 season) 03/13/2023*   Hemoglobin A1C  08/10/2023   Yearly kidney health urinalysis for diabetes  10/30/2023   Complete foot exam   10/30/2023   Yearly kidney function blood test for diabetes  02/10/2024   Medicare Annual Wellness Visit  02/26/2024   DTaP/Tdap/Td vaccine (2 - Td or Tdap) 04/10/2026   Pneumonia Vaccine  Completed   Flu Shot  Completed   DEXA scan (bone density measurement)  Completed   Hepatitis C Screening: USPSTF Recommendation to screen - Ages 53-79 yo.  Completed   Zoster (Shingles) Vaccine  Completed   HPV Vaccine  Aged Out   Colon Cancer Screening  Discontinued  *Topic was postponed. The date shown is not the original due date.    Advanced directives: In Chart  Conditions/risks identified: None  Next appointment: Follow up in one year for your annual wellness visit    Preventive Care 65 Years and Older, Female Preventive care refers to lifestyle choices and visits with your health care provider that can promote health and wellness. What does preventive care include? A yearly physical exam. This is also called an annual well check. Dental exams once or twice a year. Routine eye exams. Ask your health care provider how often you should have your eyes checked. Personal lifestyle choices, including: Daily care  of your teeth and gums. Regular physical activity. Eating a healthy diet. Avoiding tobacco and drug use. Limiting alcohol use. Practicing safe sex. Taking low-dose aspirin every day. Taking vitamin and mineral supplements as recommended by your health care provider. What happens during an annual well check? The services and screenings done by your health care provider during your annual well check will depend on your age, overall health, lifestyle risk factors, and family history of disease. Counseling  Your health care provider may ask you questions about your: Alcohol use. Tobacco use. Drug use. Emotional well-being. Home and relationship well-being. Sexual activity. Eating habits. History of falls. Memory and ability to understand (cognition). Work and work Statistician. Reproductive health. Screening  You may have the following tests or measurements: Height, weight, and BMI. Blood pressure. Lipid and cholesterol levels. These may be checked every 5 years, or more frequently if you are over 53 years old. Skin check. Lung cancer screening. You may have this screening every year starting at age 57 if you have a 30-pack-year history of smoking and currently smoke or have quit within the past 15 years. Fecal occult blood test (FOBT) of the stool. You may have this test every year starting at age 13. Flexible sigmoidoscopy or colonoscopy. You may have a  sigmoidoscopy every 5 years or a colonoscopy every 10 years starting at age 55. Hepatitis C blood test. Hepatitis B blood test. Sexually transmitted disease (STD) testing. Diabetes screening. This is done by checking your blood sugar (glucose) after you have not eaten for a while (fasting). You may have this done every 1-3 years. Bone density scan. This is done to screen for osteoporosis. You may have this done starting at age 8. Mammogram. This may be done every 1-2 years. Talk to your health care provider about how often you  should have regular mammograms. Talk with your health care provider about your test results, treatment options, and if necessary, the need for more tests. Vaccines  Your health care provider may recommend certain vaccines, such as: Influenza vaccine. This is recommended every year. Tetanus, diphtheria, and acellular pertussis (Tdap, Td) vaccine. You may need a Td booster every 10 years. Zoster vaccine. You may need this after age 23. Pneumococcal 13-valent conjugate (PCV13) vaccine. One dose is recommended after age 50. Pneumococcal polysaccharide (PPSV23) vaccine. One dose is recommended after age 46. Talk to your health care provider about which screenings and vaccines you need and how often you need them. This information is not intended to replace advice given to you by your health care provider. Make sure you discuss any questions you have with your health care provider. Document Released: 01/11/2016 Document Revised: 09/03/2016 Document Reviewed: 10/16/2015 Elsevier Interactive Patient Education  2017 [place] Prevention in the Home Falls can cause injuries. They can happen to people of all ages. There are many things you can do to make your home safe and to help prevent falls. What can I do on the outside of my home? Regularly fix the edges of walkways and driveways and fix any cracks. Remove anything that might make you trip as you walk through a door, such as a raised step or threshold. Trim any bushes or trees on the path to your home. Use bright outdoor lighting. Clear any walking paths of anything that might make someone trip, such as rocks or tools. Regularly check to see if handrails are loose or broken. Make sure that both sides of any steps have handrails. Any raised decks and porches should have guardrails on the edges. Have any leaves, snow, or ice cleared regularly. Use sand or salt on walking paths during winter. Clean up any spills in your garage right away.  This includes oil or grease spills. What can I do in the bathroom? Use night lights. Install grab bars by the toilet and in the tub and shower. Do not use towel bars as grab bars. Use non-skid mats or decals in the tub or shower. If you need to sit down in the shower, use a plastic, non-slip stool. Keep the floor dry. Clean up any water that spills on the floor as soon as it happens. Remove soap buildup in the tub or shower regularly. Attach bath mats securely with double-sided non-slip rug tape. Do not have throw rugs and other things on the floor that can make you trip. What can I do in the bedroom? Use night lights. Make sure that you have a light by your bed that is easy to reach. Do not use any sheets or blankets that are too big for your bed. They should not hang down onto the floor. Have a firm chair that has side arms. You can use this for support while you get dressed. Do not have throw rugs and other  things on the floor that can make you trip. What can I do in the kitchen? Clean up any spills right away. Avoid walking on wet floors. Keep items that you use a lot in easy-to-reach places. If you need to reach something above you, use a strong step stool that has a grab bar. Keep electrical cords out of the way. Do not use floor polish or wax that makes floors slippery. If you must use wax, use non-skid floor wax. Do not have throw rugs and other things on the floor that can make you trip. What can I do with my stairs? Do not leave any items on the stairs. Make sure that there are handrails on both sides of the stairs and use them. Fix handrails that are broken or loose. Make sure that handrails are as long as the stairways. Check any carpeting to make sure that it is firmly attached to the stairs. Fix any carpet that is loose or worn. Avoid having throw rugs at the top or bottom of the stairs. If you do have throw rugs, attach them to the floor with carpet tape. Make sure that  you have a light switch at the top of the stairs and the bottom of the stairs. If you do not have them, ask someone to add them for you. What else can I do to help prevent falls? Wear shoes that: Do not have high heels. Have rubber bottoms. Are comfortable and fit you well. Are closed at the toe. Do not wear sandals. If you use a stepladder: Make sure that it is fully opened. Do not climb a closed stepladder. Make sure that both sides of the stepladder are locked into place. Ask someone to hold it for you, if possible. Clearly mark and make sure that you can see: Any grab bars or handrails. First and last steps. Where the edge of each step is. Use tools that help you move around (mobility aids) if they are needed. These include: Canes. Walkers. Scooters. Crutches. Turn on the lights when you go into a dark area. Replace any light bulbs as soon as they burn out. Set up your furniture so you have a clear path. Avoid moving your furniture around. If any of your floors are uneven, fix them. If there are any pets around you, be aware of where they are. Review your medicines with your doctor. Some medicines can make you feel dizzy. This can increase your chance of falling. Ask your doctor what other things that you can do to help prevent falls. This information is not intended to replace advice given to you by your health care provider. Make sure you discuss any questions you have with your health care provider. Document Released: 10/11/2009 Document Revised: 05/22/2016 Document Reviewed: 01/19/2015 Elsevier Interactive Patient Education  2017 [employer].

## 2023-02-25 NOTE — Assessment & Plan Note (Signed)
Hard to tell what those 2 episodes of chest pain were but they have been more than 3 months ago for the first episode in 2 months ago for the second episode and she has not had any further symptoms.  Where she have any recurrent symptoms like this I would be more concerned and would probably want to consider ischemic evaluation with either a stress Myoview or Coronary CTA.  However, since she is not actively having symptoms I think we can monitor and just simply focus on the palpitation episodes.

## 2023-02-27 ENCOUNTER — Other Ambulatory Visit (HOSPITAL_COMMUNITY): Payer: Self-pay

## 2023-02-27 ENCOUNTER — Telehealth: Payer: Self-pay | Admitting: Family Medicine

## 2023-02-27 MED ORDER — BLOOD GLUCOSE MONITORING SUPPL DEVI: 1.0000 | Freq: Three times a day (TID) | 0 refills | Status: AC

## 2023-02-27 MED ORDER — LANCET DEVICE MISC: 100.0000 | Freq: Two times a day (BID) | 3 refills | Status: DC

## 2023-02-27 MED ORDER — BLOOD GLUCOSE TEST VI STRP: 100.0000 | ORAL_STRIP | Freq: Two times a day (BID) | 3 refills | Status: DC

## 2023-02-27 MED ORDER — LANCET DEVICE MISC: 100.0000 | Freq: Two times a day (BID) | 3 refills | Status: AC

## 2023-02-27 NOTE — Telephone Encounter (Signed)
Pt is calling and would like a new rx for one touch glucose meter with lancets and testing strips . Pt test BS twice a day  please sent to [facility] [address]

## 2023-02-27 NOTE — Telephone Encounter (Signed)
Rx done. 

## 2023-02-27 NOTE — Addendum Note (Signed)
Addended by: Agnes Lawrence on: 02/27/2023 03:48 PM   Modules accepted: Orders

## 2023-03-11 ENCOUNTER — Telehealth: Payer: Self-pay | Admitting: Family Medicine

## 2023-03-11 MED ORDER — ONETOUCH ULTRA VI STRP: 1.0000 | ORAL_STRIP | Freq: Every day | 12 refills | Status: DC

## 2023-03-11 NOTE — Telephone Encounter (Signed)
Prescription Request  03/11/2023  LOV: 02/09/2023  What is the name of the medication or equipment one touch ultra 2  Have you contacted your pharmacy to request a refill? No   Which pharmacy would you like this sent to?  [facility] [place] [address] Phone: [phone] Fax: [facility] [address] [place] [address] [place] [address] Phone: [phone] Fax: [phone]    Patient notified that their request is being sent to the clinical staff for review and that they should receive a response within 2 business days.   Please advise at Mobile [phone] (mobile)

## 2023-03-11 NOTE — Telephone Encounter (Signed)
Refill sent.

## 2023-03-16 ENCOUNTER — Telehealth: Payer: Self-pay | Admitting: Family Medicine

## 2023-03-16 NOTE — Telephone Encounter (Signed)
Pt called to request a PA for the Diabetic Shoe she was already fitted for. Pt says it will cost her $300 without the PA &/or code, and she cannot afford that.  Pt is asking for a call back to discuss.   You can also call  [facility] [phone]

## 2023-03-18 ENCOUNTER — Other Ambulatory Visit (HOSPITAL_COMMUNITY): Payer: Self-pay

## 2023-03-20 DIAGNOSIS — R002 Palpitations: Secondary | ICD-10-CM | POA: Diagnosis not present

## 2023-03-20 DIAGNOSIS — R079 Chest pain, unspecified: Secondary | ICD-10-CM | POA: Diagnosis not present

## 2023-03-25 ENCOUNTER — Other Ambulatory Visit: Payer: Self-pay

## 2023-03-25 ENCOUNTER — Other Ambulatory Visit (HOSPITAL_COMMUNITY): Payer: Self-pay

## 2023-03-25 MED FILL — Verapamil HCl Tab ER 180 MG: ORAL | 90 days supply | Qty: 90 | Fill #0 | Status: AC

## 2023-03-27 ENCOUNTER — Other Ambulatory Visit (HOSPITAL_COMMUNITY): Payer: Self-pay

## 2023-03-30 ENCOUNTER — Other Ambulatory Visit (HOSPITAL_BASED_OUTPATIENT_CLINIC_OR_DEPARTMENT_OTHER): Payer: Self-pay

## 2023-03-30 MED ORDER — CYCLOSPORINE 0.05 % OP EMUL
1.0000 [drp] | Freq: Two times a day (BID) | OPHTHALMIC | 3 refills | Status: DC
  Filled 2023-03-30: qty 180, 90d supply, fill #0
  Filled 2024-02-09: qty 180, 90d supply, fill #1

## 2023-04-01 NOTE — Telephone Encounter (Signed)
This information was given to patient

## 2023-04-08 ENCOUNTER — Other Ambulatory Visit: Payer: Self-pay

## 2023-04-09 ENCOUNTER — Other Ambulatory Visit (HOSPITAL_COMMUNITY): Payer: Self-pay

## 2023-04-10 ENCOUNTER — Telehealth: Payer: Self-pay | Admitting: Family Medicine

## 2023-04-10 NOTE — Telephone Encounter (Signed)
Requesting a prescription for OneTouch Delica Plus lancets  requesting the One Touch Ultra Soft be removed so that this doesn't happen again.  [id number] - [place], [place] - [address] Phone: [phone]  Fax: [phone]

## 2023-04-13 ENCOUNTER — Other Ambulatory Visit (HOSPITAL_COMMUNITY): Payer: Self-pay

## 2023-04-13 MED ORDER — ONETOUCH DELICA PLUS LANCET33G MISC
3 refills | Status: AC
  Filled 2023-04-13: qty 100, 33d supply, fill #0

## 2023-04-13 NOTE — Addendum Note (Signed)
Addended by: Elwin Mocha on: 04/13/2023 12:22 PM   Modules accepted: Orders

## 2023-04-14 ENCOUNTER — Other Ambulatory Visit: Payer: Self-pay

## 2023-04-14 ENCOUNTER — Other Ambulatory Visit (HOSPITAL_COMMUNITY): Payer: Self-pay

## 2023-04-15 ENCOUNTER — Other Ambulatory Visit (HOSPITAL_COMMUNITY): Payer: Self-pay

## 2023-04-17 ENCOUNTER — Other Ambulatory Visit (HOSPITAL_COMMUNITY): Payer: Self-pay

## 2023-04-29 ENCOUNTER — Other Ambulatory Visit: Payer: Self-pay

## 2023-05-05 ENCOUNTER — Other Ambulatory Visit (HOSPITAL_COMMUNITY): Payer: Self-pay

## 2023-05-05 ENCOUNTER — Other Ambulatory Visit: Payer: Self-pay | Admitting: Family Medicine

## 2023-05-05 MED ORDER — GLIPIZIDE 5 MG PO TABS
5.0000 mg | ORAL_TABLET | Freq: Every day | ORAL | 0 refills | Status: DC
  Filled 2023-05-05 – 2023-05-13 (×2): qty 90, 90d supply, fill #0

## 2023-05-13 ENCOUNTER — Other Ambulatory Visit (HOSPITAL_COMMUNITY): Payer: Self-pay

## 2023-05-19 ENCOUNTER — Encounter: Payer: Self-pay | Admitting: Cardiology

## 2023-05-19 ENCOUNTER — Ambulatory Visit: Payer: PPO | Attending: Cardiology | Admitting: Cardiology

## 2023-05-19 ENCOUNTER — Other Ambulatory Visit (HOSPITAL_COMMUNITY): Payer: Self-pay

## 2023-05-19 VITALS — BP 118/50 | HR 60 | Ht 65.0 in | Wt 184.0 lb

## 2023-05-19 DIAGNOSIS — R002 Palpitations: Secondary | ICD-10-CM

## 2023-05-19 DIAGNOSIS — R079 Chest pain, unspecified: Secondary | ICD-10-CM

## 2023-05-19 DIAGNOSIS — Z7984 Long term (current) use of oral hypoglycemic drugs: Secondary | ICD-10-CM

## 2023-05-19 DIAGNOSIS — E1169 Type 2 diabetes mellitus with other specified complication: Secondary | ICD-10-CM

## 2023-05-19 DIAGNOSIS — E119 Type 2 diabetes mellitus without complications: Secondary | ICD-10-CM

## 2023-05-19 DIAGNOSIS — I10 Essential (primary) hypertension: Secondary | ICD-10-CM

## 2023-05-19 DIAGNOSIS — E785 Hyperlipidemia, unspecified: Secondary | ICD-10-CM | POA: Diagnosis not present

## 2023-05-19 MED ORDER — AMOXICILLIN 500 MG PO CAPS
500.0000 mg | ORAL_CAPSULE | Freq: Four times a day (QID) | ORAL | 0 refills | Status: DC
  Filled 2023-05-19: qty 28, 7d supply, fill #0

## 2023-05-19 MED ORDER — IBUPROFEN 600 MG PO TABS
600.0000 mg | ORAL_TABLET | ORAL | 0 refills | Status: DC
  Filled 2023-05-19: qty 10, 2d supply, fill #0

## 2023-05-19 NOTE — Progress Notes (Signed)
Primary Care Provider: Deeann Saint, MD [facility] Cardiologist: Bryan Lemma, MD Electrophysiologist: None  Referring Provider: Deeann Saint, MD.  Clinic Note: ===================================  ASSESSMENT/PLAN   Problem List Items Addressed This Visit       Cardiology Problems   Hyperlipidemia associated with type 2 diabetes mellitus (HCC) (Chronic)    A1c was 5.1 back in February.  She is on Synjardy as well as glipizide.  Defer to PCP.  LDL on the other hand was pretty well-controlled at 81 on current dose of atorvastatin 20 mg daily.      Essential hypertension (Chronic)    Well-controlled BP today on ramipril and verapamil.        Other   Rapid palpitations - Primary (Chronic)    She may be feeling the short little bursts of PAT.  Did not happen frequently enough to be concerned to the point of increasing verapamil dose based on resting heart rate of 60 bpm.  Continue adequate hydration.  Avoid stressors and caffeine.      Chest pain of uncertain etiology    No longer having any more chest pain.  That we need to test any further at this point.  Symptoms seem to be related to tachycardia that seems to be pretty stabilized likely stress related.  If she were to have recurrence of symptoms we can consider Coronary CTA or other stress test options.       ===================================  HPI:    Tara Guerrero is a 77 y.o. female with a PMH notable for HTN, HLD and DM-2 who presents today for 27-Month Follow-Up Evaluation of Chest Pain and Palpitations.  Tara Guerrero was last seen on February 23, 2023 for evaluation of "Episodes of Chest Pain/Palpitations "at the request of Abbe Amsterdam, MD. she had 2 different episodes of chest pain 6 weeks apart-mom was at church and mom was found laying down in bed.  She described a substernal chest pressure radiating to the right arm and back.  Felt to be 9 out of 10.  Also noted that her heart rate  was fast.  Both times her phone alerted that she had a increased heart rate.  Episodes lasted maybe 10 minutes.  No associated dyspnea.  Had not had any further episodes.  Occasional sharp stabbing discomfort in her chest every now and then.  These are associated with tachycardia noted on smart phone over the past couple weeks.  Sharp pain can take her breath away and the monitor on her watch says "tachycardic ".  No real episodes of chest pain or pressure unless these episodes were occurring.  No exertional chest pain or pressure. Back-to-back Zio patch monitors If further chest pain would consider Coronary CTA  Recent Hospitalizations: n/a  Reviewed  CV studies:    The following studies were reviewed today: (if available, images/films reviewed: From Epic Chart or Care Everywhere) 3 monitors for total of 35.5 days (February-April 2024)   Predominant Rhythm overall is Sinus with a rate range of 44 to 125 bpm, average 77 bpm.   Rare isolated PACs and PVCs noted with couplets and triplets.  Ventricular bigeminy and trigeminy also noted in short bursts.  Longest was 15 seconds. => Symptoms noted with PACs and PVCs   A total of 7 atrial runs ranging from 4-13 in beats (1.4 to 7.1 seconds): Fastest was 4 beats (1.4 seconds) average heart rate 147 bpm (fastest 153 bpm).  Longest was 13 beats (7.1 seconds) with  average heart rate 100 bpm (max 121 bpm).   No Sustained Arrhythmias: Atrial Tachycardia (AT), Supraventricular Tachycardia (SVT), Atrial Fibrillation (A-Fib), Atrial Flutter (A-Flutter), Sustained Ventricular Tachycardia (VT)   Interval History:   Tara Guerrero returns here today to discuss results of her monitor.  She actually says that she is feeling much better now.  Not really noticing any more symptoms of palpitations or chest pain.  She says a lot of the stresses been somewhat relieved but she still is sad because she had to euthanize her dog.  She therefore has not been as active and  not walking as much.   She denies any rapid irregular heartbeats or palpitations.  No dizziness.  No lightheadedness.  No syncope or near syncope.  We reviewed the results of her monitor.  She says that she is not all that sure she felt the tachycardic spells noted there.  She has not had that tachycardia spells she had had before.  CV Review of Symptoms (Summary):: no chest pain or dyspnea on exertion negative for - edema, irregular heartbeat, murmur, orthopnea, palpitations, paroxysmal nocturnal dyspnea, rapid heart rate, shortness of breath, or syncope/near syncope or TIA/amaurosis fugax claudication  REVIEWED OF SYSTEMS   Pertinent symptoms: Bilateral knee pain.  Stiff and painful.  Hair loss.;  Somewhat upset and grieving-had to euthanize her dog since her last visit.  I have reviewed and (if needed) personally updated the patient's problem list, medications, allergies, past medical and surgical history, social and family history.   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   Depression    Diabetes mellitus without complication (HCC)    Diverticulosis    Hyperlipidemia    Hyperplastic colon polyp 11/2014   Hypertension    Sleep apnea     PAST SURGICAL HISTORY   Past Surgical History:  Procedure Laterality Date   ABDOMINAL HYSTERECTOMY  1976   s/p uterine CA   BREAST EXCISIONAL BIOPSY Bilateral    BREAST SURGERY  1995   L breast node removal   BREAST SURGERY     R breast biopsy   CHOLECYSTECTOMY N/A 10/17/2021   Procedure: LAPAROSCOPIC CHOLECYSTECTOMY;  Surgeon: Harriette Bouillon, MD;  Location: [facility] OR;  Service: General;  Laterality: N/A;   EYE MUSCLE SURGERY  2019   EYE SURGERY  2017   bilateral cataract surgery   GALLBLADDER SURGERY     JOINT REPLACEMENT  2004   bilateral knee replacements   TONSILLECTOMY  1950    MEDICATIONS/ALLERGIES   Current Meds  Medication Sig   acetaminophen (TYLENOL) 500 MG tablet Take  500 mg by mouth every 6 (six) hours as needed for moderate pain.   APPLE CIDER VINEGAR PO Take by mouth.   Ascorbic Acid (VITAMIN C) 1000 MG tablet Take 1,000 mg by mouth 2 (two) times a week.   aspirin EC 81 MG tablet Take 81 mg by mouth daily.   atorvastatin (LIPITOR) 20 MG tablet Take 1 tablet (20 mg total) by mouth daily.   Blood Glucose Monitoring Suppl (ONE TOUCH ULTRA 2) w/Device KIT Use to Check Blood glucose daily.( Dx:Z51.81)   Blood Glucose Monitoring Suppl DEVI 1 each by Does not apply route in the morning, at noon, and at bedtime. May substitute to any manufacturer covered by patient's insurance.   buPROPion ER (WELLBUTRIN SR) 100 MG 12 hr tablet Take 1 tablet (100 mg total) by mouth daily.   Calcium Carb-Cholecalciferol (  CALCIUM 600 + D PO) Take 1 tablet by mouth 2 (two) times a week.   cetirizine (ZYRTEC) 10 MG tablet Take 10 mg by mouth daily.   cycloSPORINE (RESTASIS) 0.05 % ophthalmic emulsion Place 1 drop into both eyes every 12 (twelve) hours.   Empagliflozin-metFORMIN HCl (SYNJARDY) 12.04-999 MG TABS Take 1 tablet by mouth 2 (two) times daily.   glipiZIDE (GLUCOTROL) 5 MG tablet Take 1 tablet by mouth daily.   Lancets (ONETOUCH DELICA PLUS LANCET33G) MISC Use to check blood sugar up to 3 times daily.   Multiple Vitamin (MULTIVITAMIN WITH MINERALS) TABS tablet Take 1 tablet by mouth 2 (two) times a week.   ramipril (ALTACE) 10 MG capsule TAKE 1 CAPSULE BY MOUTH  DAILY   verapamil (CALAN-SR) 180 MG CR tablet Take 1 tablet (180 mg total) by mouth at bedtime.   [DISCONTINUED] cycloSPORINE (RESTASIS) 0.05 % ophthalmic emulsion Place 1 drop into both eyes daily.    Allergies  Allergen Reactions   Codeine Other (See Comments)    Agitation     SOCIAL HISTORY/FAMILY HISTORY   Reviewed in Epic:  Pertinent findings: Former smoker.  1 or 2 glasses of wine a week.  Social History   Social History Narrative   01/24/2019: Lives with granddaughter and dog.    Retired        [employer] her dog for 5 times a day.  Usually does about flat ground.  Does not take stairs because of knee pain from bilateral knee replacements.    OBJCTIVE -PE, EKG, labs   Wt Readings from Last 3 Encounters:  05/19/23 184 lb (83.5 kg)  02/25/23 183 lb (83 kg)  02/23/23 183 lb (83 kg)    Physical Exam: BP (!) 118/50 (BP Location: Left Arm, Patient Position: Sitting, Cuff Size: Normal)   Pulse 60   Ht 5\' 5"  (1.651 m)   Wt 184 lb (83.5 kg)   BMI 30.62 kg/m  Physical Exam Vitals reviewed.  Constitutional:      General: She is not in acute distress.    Appearance: Normal appearance. She is obese. She is not ill-appearing or toxic-appearing.  HENT:     Head: Normocephalic and atraumatic.  Neck:     Vascular: No carotid bruit or JVD.  Cardiovascular:     Rate and Rhythm: Normal rate and regular rhythm. No extrasystoles are present.    Chest Wall: PMI is not displaced.     Pulses: Normal pulses.     Heart sounds: Normal heart sounds, S1 normal and S2 normal. No murmur heard.    No friction rub. No gallop.  Pulmonary:     Effort: Pulmonary effort is normal. No respiratory distress.     Breath sounds: Normal breath sounds. No wheezing, rhonchi or rales.  Musculoskeletal:        General: No swelling.     Cervical back: Normal range of motion.  Skin:    General: Skin is warm and dry.  Neurological:     General: No focal deficit present.     Mental Status: She is alert and oriented to person, place, and time.  Psychiatric:        Mood and Affect: Mood normal.        Behavior: Behavior normal.        Thought Content: Thought content normal.        Judgment: Judgment normal.      Adult ECG Report N/A  Recent Labs: Reviewed Lab Results  Component Value Date  CHOL 182 02/09/2023   HDL 68.00 02/09/2023   LDLCALC 81 02/09/2023   TRIG 167.0 (H) 02/09/2023   CHOLHDL 3 02/09/2023   Lab Results  Component Value Date   CREATININE 0.90 02/09/2023   BUN 17 02/09/2023   NA  138 02/09/2023   K 4.4 02/09/2023   CL 99 02/09/2023   CO2 30 02/09/2023      Latest Ref Rng & Units 02/09/2023   11:32 AM 10/11/2021    3:19 PM 08/26/2021   12:13 PM  CBC  WBC 4.0 - 10.5 K/uL 9.2  10.4  7.5   Hemoglobin 12.0 - 15.0 g/dL 16.1  09.6  04.5   Hematocrit 36.0 - 46.0 % 40.6  44.7  45.3   Platelets 150.0 - 400.0 K/uL 315.0  290  268     Lab Results  Component Value Date   HGBA1C 9.1 (H) 02/09/2023   Lab Results  Component Value Date   TSH 2.00 02/09/2023    ================================================== I spent a total of with the patient spent in direct patient consultation.  Additional time spent with chart review  / charting (studies, outside notes, etc): 15 min Total Time: 31 min  Current medicines are reviewed at length with the patient today.  (+/- concerns) N/A  Notice: This dictation was prepared with Dragon dictation along with smart phrase technology. Any transcriptional errors that result from this process are unintentional and may not be corrected upon review.  Studies Ordered:   No orders of the defined types were placed in this encounter.  No orders of the defined types were placed in this encounter.   Patient Instructions / Medication Changes & Studies & Tests Ordered   Patient Instructions  Medication Instructions:  No changes  *If you need a refill on your cardiac medications before your next appointment, please call your pharmacy*   Lab Work: Not  needed    Testing/Procedures: Not needed   Follow-Up: At [facility], you and your health needs are our priority.  As part of our continuing mission to provide you with exceptional heart care, we have created designated Provider Care Teams.  These Care Teams include your primary Cardiologist (physician) and Advanced Practice Providers (APPs -  Physician Assistants and Nurse Practitioners) who all work together to provide you with the care you need, when you need it.      Your next appointment:   8 to 9 month(s)  The format for your next appointment:   In Person  Provider:   Bryan Lemma, MD        Marykay Lex, MD, MS Bryan Lemma, M.D., M.S. Interventional Cardiologist  [facility]  Pager # [phone] Phone # [phone] [address] [place], [place] [address]   Thank you for choosing [facility] at [place]!!

## 2023-05-19 NOTE — Patient Instructions (Addendum)
Medication Instructions:  No changes  *If you need a refill on your cardiac medications before your next appointment, please call your pharmacy*   Lab Work: Not  needed    Testing/Procedures: Not needed   Follow-Up: At [facility], you and your health needs are our priority.  As part of our continuing mission to provide you with exceptional heart care, we have created designated Provider Care Teams.  These Care Teams include your primary Cardiologist (physician) and Advanced Practice Providers (APPs -  Physician Assistants and Nurse Practitioners) who all work together to provide you with the care you need, when you need it.     Your next appointment:   8 to 9 month(s)  The format for your next appointment:   In Person  Provider:   Bryan Lemma, MD

## 2023-05-31 ENCOUNTER — Encounter: Payer: Self-pay | Admitting: Cardiology

## 2023-05-31 NOTE — Assessment & Plan Note (Signed)
She may be feeling the short little bursts of PAT.  Did not happen frequently enough to be concerned to the point of increasing verapamil dose based on resting heart rate of 60 bpm.  Continue adequate hydration.  Avoid stressors and caffeine.

## 2023-05-31 NOTE — Assessment & Plan Note (Addendum)
A1c was 5.1 back in February.  She is on Synjardy as well as glipizide.  Defer to PCP.  LDL on the other hand was pretty well-controlled at 81 on current dose of atorvastatin 20 mg daily.

## 2023-05-31 NOTE — Assessment & Plan Note (Signed)
No longer having any more chest pain.  That we need to test any further at this point.  Symptoms seem to be related to tachycardia that seems to be pretty stabilized likely stress related.  If she were to have recurrence of symptoms we can consider Coronary CTA or other stress test options.

## 2023-05-31 NOTE — Assessment & Plan Note (Signed)
Well-controlled BP today on ramipril and verapamil.

## 2023-06-22 ENCOUNTER — Other Ambulatory Visit: Payer: Self-pay

## 2023-06-22 ENCOUNTER — Other Ambulatory Visit (HOSPITAL_COMMUNITY): Payer: Self-pay

## 2023-06-22 ENCOUNTER — Other Ambulatory Visit: Payer: Self-pay | Admitting: Family Medicine

## 2023-06-22 MED ORDER — VERAPAMIL HCL ER 180 MG PO TBCR
180.0000 mg | EXTENDED_RELEASE_TABLET | Freq: Every evening | ORAL | 1 refills | Status: DC
  Filled 2023-06-22 – 2023-09-07 (×2): qty 90, 90d supply, fill #0

## 2023-06-22 MED ORDER — GLIPIZIDE 5 MG PO TABS
5.0000 mg | ORAL_TABLET | Freq: Every day | ORAL | 0 refills | Status: DC
  Filled 2023-06-22 – 2023-08-13 (×2): qty 90, 90d supply, fill #0

## 2023-07-18 ENCOUNTER — Other Ambulatory Visit (HOSPITAL_BASED_OUTPATIENT_CLINIC_OR_DEPARTMENT_OTHER): Payer: Self-pay

## 2023-07-18 ENCOUNTER — Encounter (HOSPITAL_BASED_OUTPATIENT_CLINIC_OR_DEPARTMENT_OTHER): Payer: Self-pay

## 2023-07-18 ENCOUNTER — Emergency Department (HOSPITAL_BASED_OUTPATIENT_CLINIC_OR_DEPARTMENT_OTHER): Payer: PPO | Admitting: Radiology

## 2023-07-18 ENCOUNTER — Other Ambulatory Visit: Payer: Self-pay

## 2023-07-18 ENCOUNTER — Emergency Department (HOSPITAL_BASED_OUTPATIENT_CLINIC_OR_DEPARTMENT_OTHER)
Admission: EM | Admit: 2023-07-18 | Discharge: 2023-07-18 | Disposition: A | Discharge status: Home / Self Care | Payer: PPO | Attending: Emergency Medicine | Admitting: Emergency Medicine

## 2023-07-18 DIAGNOSIS — J181 Lobar pneumonia, unspecified organism: Secondary | ICD-10-CM | POA: Diagnosis not present

## 2023-07-18 DIAGNOSIS — R55 Syncope and collapse: Secondary | ICD-10-CM | POA: Diagnosis not present

## 2023-07-18 DIAGNOSIS — E119 Type 2 diabetes mellitus without complications: Secondary | ICD-10-CM | POA: Diagnosis not present

## 2023-07-18 DIAGNOSIS — Z7982 Long term (current) use of aspirin: Secondary | ICD-10-CM | POA: Diagnosis not present

## 2023-07-18 DIAGNOSIS — R918 Other nonspecific abnormal finding of lung field: Secondary | ICD-10-CM | POA: Diagnosis not present

## 2023-07-18 DIAGNOSIS — R0602 Shortness of breath: Secondary | ICD-10-CM | POA: Diagnosis not present

## 2023-07-18 DIAGNOSIS — J1282 Pneumonia due to coronavirus disease 2019: Secondary | ICD-10-CM | POA: Diagnosis not present

## 2023-07-18 DIAGNOSIS — U071 COVID-19: Secondary | ICD-10-CM | POA: Insufficient documentation

## 2023-07-18 DIAGNOSIS — J984 Other disorders of lung: Secondary | ICD-10-CM | POA: Diagnosis not present

## 2023-07-18 DIAGNOSIS — J189 Pneumonia, unspecified organism: Secondary | ICD-10-CM

## 2023-07-18 DIAGNOSIS — R059 Cough, unspecified: Secondary | ICD-10-CM | POA: Diagnosis not present

## 2023-07-18 MED ORDER — AZITHROMYCIN 250 MG PO TABS
250.0000 mg | ORAL_TABLET | Freq: Every day | ORAL | 0 refills | Status: DC
  Filled 2023-07-18: qty 4, 4d supply, fill #0

## 2023-07-18 MED ORDER — AZITHROMYCIN 250 MG PO TABS
500.0000 mg | ORAL_TABLET | Freq: Once | ORAL | Status: AC
  Administered 2023-07-18: 500 mg via ORAL
  Filled 2023-07-18: qty 2

## 2023-07-18 MED ORDER — AMOXICILLIN-POT CLAVULANATE 875-125 MG PO TABS
1.0000 | ORAL_TABLET | Freq: Once | ORAL | Status: AC
  Administered 2023-07-18: 1 via ORAL
  Filled 2023-07-18: qty 1

## 2023-07-18 MED ORDER — AMOXICILLIN-POT CLAVULANATE 875-125 MG PO TABS
1.0000 | ORAL_TABLET | Freq: Two times a day (BID) | ORAL | 0 refills | Status: DC
  Filled 2023-07-18: qty 10, 5d supply, fill #0

## 2023-07-18 MED ORDER — MOLNUPIRAVIR EUA 200MG CAPSULE
4.0000 | ORAL_CAPSULE | Freq: Two times a day (BID) | ORAL | 0 refills | Status: AC
  Filled 2023-07-18: qty 40, 5d supply, fill #0

## 2023-07-18 NOTE — ED Provider Notes (Signed)
[place] EMERGENCY DEPARTMENT AT [facility] Provider Note   CSN: [id number] Arrival date & time: 07/18/23  5284     History  Chief Complaint  Patient presents with   Known COVID    Tara Guerrero is a 77 y.o. female.  Patient is a 77 year old female with a past medical history of diabetes, hyperlipidemia and palpitations on verapamil presenting to the emergency department for positive COVID test at home.  The patient states that on Thursday night she developed a mild cough.  She states that yesterday she developed a sore throat with bodyaches and fatigue.  She states that she has some mild shortness of breath.  She denies any fevers, nausea, vomiting or diarrhea.  The history is provided by the patient.       Home Medications Prior to Admission medications   Medication Sig Start Date End Date Taking? Authorizing Provider  amoxicillin-clavulanate (AUGMENTIN) 875-125 MG tablet Take 1 tablet by mouth every 12 (twelve) hours for 5 days. 07/18/23 07/23/23 Yes Kingsley, Benetta Spar K, DO  azithromycin (ZITHROMAX) 250 MG tablet Take 1 tablet (250 mg total) by mouth daily for 4 days. Take first 2 tablets together, then 1 every day until finished. 07/18/23 07/22/23 Yes Kingsley, Benetta Spar K, DO  molnupiravir EUA (LAGEVRIO) 200 mg CAPS capsule Take 4 capsules (800 mg total) by mouth 2 (two) times daily for 5 days. 07/18/23 07/23/23 Yes Elayne Snare K, DO  acetaminophen (TYLENOL) 500 MG tablet Take 500 mg by mouth every 6 (six) hours as needed for moderate pain.    [provider]  APPLE CIDER VINEGAR PO Take by mouth.    [provider]  Ascorbic Acid (VITAMIN C) 1000 MG tablet Take 1,000 mg by mouth 2 (two) times a week.    [provider]  aspirin EC 81 MG tablet Take 81 mg by mouth daily.    [provider]  atorvastatin (LIPITOR) 20 MG tablet Take 1 tablet (20 mg total) by mouth daily. 12/31/22   Deeann Saint, MD  Blood Glucose Monitoring  Suppl (ONE TOUCH ULTRA 2) w/Device KIT Use to Check Blood glucose daily.( Dx:Z51.81) 01/31/19   Deeann Saint, MD  Blood Glucose Monitoring Suppl DEVI 1 each by Does not apply route in the morning, at noon, and at bedtime. May substitute to any manufacturer covered by patient's insurance. 02/27/23   Deeann Saint, MD  buPROPion ER [facility] SR) 100 MG 12 hr tablet Take 1 tablet (100 mg total) by mouth daily. 12/31/22   Deeann Saint, MD  Calcium Carb-Cholecalciferol (CALCIUM 600 + D PO) Take 1 tablet by mouth 2 (two) times a week.    [provider]  cetirizine (ZYRTEC) 10 MG tablet Take 10 mg by mouth daily.    [provider]  cycloSPORINE (RESTASIS) 0.05 % ophthalmic emulsion Place 1 drop into both eyes every 12 (twelve) hours. 03/30/23     Empagliflozin-metFORMIN HCl (SYNJARDY) 12.04-999 MG TABS Take 1 tablet by mouth 2 (two) times daily. 12/31/22   Deeann Saint, MD  glipiZIDE (GLUCOTROL) 5 MG tablet Take 1 tablet by mouth daily. 06/22/23   Deeann Saint, MD  Lancets [facility] DELICA PLUS [place]) MISC Use to check blood sugar up to 3 times daily. 04/13/23   Deeann Saint, MD  Multiple Vitamin (MULTIVITAMIN WITH MINERALS) TABS tablet Take 1 tablet by mouth 2 (two) times a week.    [provider]  ramipril (ALTACE) 10 MG capsule TAKE 1 CAPSULE  BY MOUTH  DAILY 02/13/23   Deeann Saint, MD  verapamil (CALAN-SR) 180 MG CR tablet Take 1 tablet (180 mg total) by mouth at bedtime. 06/22/23   Deeann Saint, MD      Allergies    Codeine    Review of Systems   Review of Systems  Physical Exam Updated Vital Signs BP (!) 154/71 (BP Location: Right Arm)   Pulse (!) 45   Temp 98.3 F (36.8 C) (Oral)   Resp 16   SpO2 97%  Physical Exam Vitals and nursing note reviewed.  Constitutional:      General: She is not in acute distress.    Appearance: Normal appearance.  HENT:     Head: Normocephalic and atraumatic.     Nose: Nose normal.     Mouth/Throat:      Mouth: Mucous membranes are moist.     Pharynx: Oropharynx is clear.     Comments: No tonsillar swelling or exudates, uvula midline, normal phonation, no trismus, tolerating secretions Eyes:     Extraocular Movements: Extraocular movements intact.     Conjunctiva/sclera: Conjunctivae normal.  Cardiovascular:     Rate and Rhythm: Normal rate and regular rhythm.     Heart sounds: Normal heart sounds.  Pulmonary:     Effort: Pulmonary effort is normal.     Breath sounds: Normal breath sounds.  Abdominal:     General: Abdomen is flat.     Palpations: Abdomen is soft.     Tenderness: There is no abdominal tenderness.  Musculoskeletal:        General: Normal range of motion.     Cervical back: Normal range of motion.  Skin:    General: Skin is warm and dry.  Neurological:     General: No focal deficit present.     Mental Status: She is alert and oriented to person, place, and time.  Psychiatric:        Mood and Affect: Mood normal.        Behavior: Behavior normal.     ED Results / Procedures / Treatments   Labs (all labs ordered are listed, but only abnormal results are displayed) Labs Reviewed - No data to display  EKG None  Radiology DG Chest 2 View  Result Date: 07/18/2023 CLINICAL DATA:  Cough and shortness of breath. EXAM: CHEST - 2 VIEW COMPARISON:  None Available. FINDINGS: Right lung clear. Subtle airspace disease noted left base. No edema or pleural effusion. The cardiopericardial silhouette is within normal limits for size. No acute bony abnormality. IMPRESSION: Subtle airspace disease at the left base could be atelectasis or infiltrate. Electronically Signed   By: Kennith Center M.D.   On: 07/18/2023 10:39    Procedures Procedures    Medications Ordered in ED Medications  amoxicillin-clavulanate (AUGMENTIN) 875-125 MG per tablet 1 tablet (has no administration in time range)  azithromycin (ZITHROMAX) tablet 500 mg (has no administration in time range)     ED Course/ Medical Decision Making/ A&P Clinical Course as of 07/18/23 1106  Sat Jul 18, 2023  1101 CXR with concern for LLL consolidation. Will be treated with antibiotics and recommended outpatient follow up. [VK]    Clinical Course User Index [VK] Rexford Maus, DO                             Medical Decision Making This patient presents to the ED with chief complaint(s) of sore  throat, cough, positive covid test with pertinent past medical history of DM, HLD, palpitaions which further complicates the presenting complaint. The complaint involves an extensive differential diagnosis and also carries with it a high risk of complications and morbidity.    The differential diagnosis includes COVID, viral syndrome, pneumonia, no signs of severe dehydration, low risk by Centor criteria making Strep throat unlikely, no evidence of RPA, PTA or other deep space infection  Additional history obtained: Additional history obtained from N/A Records reviewed outpatient cardiology records  ED Course and Reassessment: On patient's arrival to the emergency department she is mildly bradycardic and otherwise hemodynamically stable in no acute distress.  She is satting well on room air however with her shortness of breath we will of x-ray to evaluate for possible pneumonia.  She does have risk factors warranting antiviral treatment for COVID and will have her medications reviewed to see what she may be able to be treated with.  Independent labs interpretation:  N/A  Independent visualization of imaging: - I independently visualized the following imaging with scope of interpretation limited to determining acute life threatening conditions related to emergency care: CXR, which revealed LLL pneumonia  Consultation: - Consulted or discussed management/test interpretation w/ external professional: N/A  Consideration for admission or further workup: Patient has no emergent conditions requiring  admission or further work-up at this time and is stable for discharge home with primary care follow-up  Social Determinants of health: N/A    Amount and/or Complexity of Data Reviewed Radiology: ordered.          Final Clinical Impression(s) / ED Diagnoses Final diagnoses:  COVID-19  Pneumonia of left lower lobe due to infectious organism    Rx / DC Orders ED Discharge Orders          Ordered    molnupiravir EUA (LAGEVRIO) 200 mg CAPS capsule  2 times daily        07/18/23 1104    amoxicillin-clavulanate (AUGMENTIN) 875-125 MG tablet  Every 12 hours        07/18/23 1104    azithromycin (ZITHROMAX) 250 MG tablet  Daily        07/18/23 1104              Rexford Maus, DO 07/18/23 1106

## 2023-07-18 NOTE — ED Triage Notes (Signed)
She c/o uri sx x 2 days. She c/o mild sore throat and h/a. She is alert and oriented x 4 with clear speech.

## 2023-07-18 NOTE — Discharge Instructions (Signed)
You were seen in the emergency department for your positive COVID test.  Your workup also showed that you are developing a pneumonia.  I have started you on antibiotics for the pneumonia and given you an antiviral for COVID treatment.  It is safe to take your normal prescriptions with this antiviral medicine.  You can continue to take Mucinex or use raw honey to help with your cough and use throat lozenges for your sore throat.  You can follow-up with your primary doctor in the next few days to have your symptoms rechecked.  You should return to the emergency department for fevers despite the antibiotics, worsening shortness of breath, severe chest pain or any other new or concerning symptoms.

## 2023-07-19 ENCOUNTER — Encounter (HOSPITAL_COMMUNITY): Payer: Self-pay

## 2023-07-19 ENCOUNTER — Observation Stay (HOSPITAL_COMMUNITY)
Admission: EM | Admit: 2023-07-19 | Discharge: 2023-07-21 | Disposition: A | Discharge status: Home / Self Care | Payer: PPO | Attending: Internal Medicine | Admitting: Internal Medicine

## 2023-07-19 ENCOUNTER — Emergency Department (HOSPITAL_COMMUNITY): Payer: PPO

## 2023-07-19 DIAGNOSIS — D72829 Elevated white blood cell count, unspecified: Secondary | ICD-10-CM | POA: Diagnosis not present

## 2023-07-19 DIAGNOSIS — M1811 Unilateral primary osteoarthritis of first carpometacarpal joint, right hand: Secondary | ICD-10-CM | POA: Diagnosis not present

## 2023-07-19 DIAGNOSIS — R918 Other nonspecific abnormal finding of lung field: Secondary | ICD-10-CM | POA: Diagnosis not present

## 2023-07-19 DIAGNOSIS — U071 COVID-19: Secondary | ICD-10-CM

## 2023-07-19 DIAGNOSIS — S022XXA Fracture of nasal bones, initial encounter for closed fracture: Secondary | ICD-10-CM | POA: Diagnosis not present

## 2023-07-19 DIAGNOSIS — I700 Atherosclerosis of aorta: Secondary | ICD-10-CM | POA: Diagnosis not present

## 2023-07-19 DIAGNOSIS — I491 Atrial premature depolarization: Secondary | ICD-10-CM | POA: Diagnosis not present

## 2023-07-19 DIAGNOSIS — R002 Palpitations: Secondary | ICD-10-CM | POA: Diagnosis not present

## 2023-07-19 DIAGNOSIS — R55 Syncope and collapse: Principal | ICD-10-CM | POA: Insufficient documentation

## 2023-07-19 DIAGNOSIS — Z79899 Other long term (current) drug therapy: Secondary | ICD-10-CM | POA: Diagnosis not present

## 2023-07-19 DIAGNOSIS — Z043 Encounter for examination and observation following other accident: Secondary | ICD-10-CM | POA: Diagnosis not present

## 2023-07-19 DIAGNOSIS — W19XXXA Unspecified fall, initial encounter: Secondary | ICD-10-CM | POA: Diagnosis not present

## 2023-07-19 DIAGNOSIS — I10 Essential (primary) hypertension: Secondary | ICD-10-CM | POA: Diagnosis not present

## 2023-07-19 DIAGNOSIS — Z87891 Personal history of nicotine dependence: Secondary | ICD-10-CM | POA: Insufficient documentation

## 2023-07-19 DIAGNOSIS — Z8542 Personal history of malignant neoplasm of other parts of uterus: Secondary | ICD-10-CM | POA: Insufficient documentation

## 2023-07-19 DIAGNOSIS — Z7982 Long term (current) use of aspirin: Secondary | ICD-10-CM | POA: Diagnosis not present

## 2023-07-19 DIAGNOSIS — S0990XA Unspecified injury of head, initial encounter: Secondary | ICD-10-CM | POA: Diagnosis not present

## 2023-07-19 DIAGNOSIS — I2510 Atherosclerotic heart disease of native coronary artery without angina pectoris: Secondary | ICD-10-CM | POA: Diagnosis not present

## 2023-07-19 DIAGNOSIS — F32A Depression, unspecified: Secondary | ICD-10-CM | POA: Diagnosis not present

## 2023-07-19 DIAGNOSIS — Z7984 Long term (current) use of oral hypoglycemic drugs: Secondary | ICD-10-CM | POA: Insufficient documentation

## 2023-07-19 DIAGNOSIS — M1812 Unilateral primary osteoarthritis of first carpometacarpal joint, left hand: Secondary | ICD-10-CM | POA: Diagnosis not present

## 2023-07-19 DIAGNOSIS — J189 Pneumonia, unspecified organism: Secondary | ICD-10-CM | POA: Diagnosis not present

## 2023-07-19 DIAGNOSIS — Z96653 Presence of artificial knee joint, bilateral: Secondary | ICD-10-CM | POA: Insufficient documentation

## 2023-07-19 DIAGNOSIS — R911 Solitary pulmonary nodule: Secondary | ICD-10-CM | POA: Insufficient documentation

## 2023-07-19 DIAGNOSIS — E1165 Type 2 diabetes mellitus with hyperglycemia: Secondary | ICD-10-CM | POA: Diagnosis not present

## 2023-07-19 DIAGNOSIS — J1282 Pneumonia due to coronavirus disease 2019: Secondary | ICD-10-CM | POA: Diagnosis present

## 2023-07-19 DIAGNOSIS — I499 Cardiac arrhythmia, unspecified: Secondary | ICD-10-CM | POA: Diagnosis not present

## 2023-07-19 DIAGNOSIS — N289 Disorder of kidney and ureter, unspecified: Secondary | ICD-10-CM | POA: Diagnosis not present

## 2023-07-19 DIAGNOSIS — M79641 Pain in right hand: Secondary | ICD-10-CM | POA: Diagnosis not present

## 2023-07-19 DIAGNOSIS — M25521 Pain in right elbow: Secondary | ICD-10-CM | POA: Diagnosis not present

## 2023-07-19 DIAGNOSIS — J342 Deviated nasal septum: Secondary | ICD-10-CM | POA: Diagnosis not present

## 2023-07-19 LAB — CBC
HCT: 39.9 % (ref 36.0–46.0)
Hemoglobin: 12.3 g/dL (ref 12.0–15.0)
MCH: 26 pg (ref 26.0–34.0)
MCHC: 30.8 g/dL (ref 30.0–36.0)
MCV: 84.4 fL (ref 80.0–100.0)
Platelets: 234 10*3/uL (ref 150–400)
RBC: 4.73 MIL/uL (ref 3.87–5.11)
RDW: 15.4 % (ref 11.5–15.5)
WBC: 11.9 10*3/uL — ABNORMAL HIGH (ref 4.0–10.5)
nRBC: 0 % (ref 0.0–0.2)

## 2023-07-19 LAB — URINALYSIS, ROUTINE W REFLEX MICROSCOPIC
Bacteria, UA: NONE SEEN
Bilirubin Urine: NEGATIVE
Glucose, UA: >=500 mg/dL — AB
Hgb urine dipstick: NEGATIVE
Ketones, ur: 20 mg/dL — AB
Leukocytes,Ua: NEGATIVE
Nitrite: NEGATIVE
Protein, ur: NEGATIVE mg/dL
Specific Gravity, Urine: 1.022 (ref 1.005–1.030)
pH: 6 (ref 5.0–8.0)

## 2023-07-19 LAB — COMPREHENSIVE METABOLIC PANEL
ALT: 28 U/L (ref 0–44)
AST: 27 U/L (ref 15–41)
Albumin: 3.3 g/dL — ABNORMAL LOW (ref 3.5–5.0)
Alkaline Phosphatase: 71 U/L (ref 38–126)
Anion gap: 10 (ref 5–15)
BUN: 16 mg/dL (ref 8–23)
CO2: 23 mmol/L (ref 22–32)
Calcium: 8.5 mg/dL — ABNORMAL LOW (ref 8.9–10.3)
Chloride: 102 mmol/L (ref 98–111)
Creatinine, Ser: 1.02 mg/dL — ABNORMAL HIGH (ref 0.44–1.00)
GFR, Estimated: 56 mL/min — ABNORMAL LOW (ref >60)
Glucose, Bld: 213 mg/dL — ABNORMAL HIGH (ref 70–99)
Potassium: 4.2 mmol/L (ref 3.5–5.1)
Sodium: 135 mmol/L (ref 135–145)
Total Bilirubin: 0.6 mg/dL (ref 0.3–1.2)
Total Protein: 6.2 g/dL — ABNORMAL LOW (ref 6.5–8.1)

## 2023-07-19 LAB — GLUCOSE, CAPILLARY
Glucose-Capillary: 157 mg/dL — ABNORMAL HIGH (ref 70–99)
Glucose-Capillary: 139 mg/dL — ABNORMAL HIGH (ref 70–99)

## 2023-07-19 LAB — HEMOGLOBIN A1C
Hgb A1c MFr Bld: 7.6 % — ABNORMAL HIGH (ref 4.8–5.6)
Mean Plasma Glucose: 171.42 mg/dL

## 2023-07-19 LAB — CBG MONITORING, ED: Glucose-Capillary: 161 mg/dL — ABNORMAL HIGH (ref 70–99)

## 2023-07-19 LAB — LIPASE, BLOOD: Lipase: 34 U/L (ref 11–51)

## 2023-07-19 LAB — SARS CORONAVIRUS 2 BY RT PCR: SARS Coronavirus 2 by RT PCR: POSITIVE — AB

## 2023-07-19 LAB — PROCALCITONIN: Procalcitonin: <0.1 ng/mL

## 2023-07-19 LAB — TROPONIN I (HIGH SENSITIVITY)
Troponin I (High Sensitivity): 5 ng/L (ref <18)
Troponin I (High Sensitivity): 5 ng/L (ref <18)

## 2023-07-19 LAB — MAGNESIUM: Magnesium: 2 mg/dL (ref 1.7–2.4)

## 2023-07-19 MED ORDER — GUAIFENESIN ER 600 MG PO TB12
600.0000 mg | ORAL_TABLET | Freq: Two times a day (BID) | ORAL | Status: DC
  Administered 2023-07-19 – 2023-07-21 (×5): 600 mg via ORAL
  Filled 2023-07-19 (×5): qty 1

## 2023-07-19 MED ORDER — LIDOCAINE 5 % EX PTCH
2.0000 | MEDICATED_PATCH | Freq: Every day | CUTANEOUS | Status: DC
  Administered 2023-07-19: 2 via TRANSDERMAL
  Filled 2023-07-19 (×2): qty 2

## 2023-07-19 MED ORDER — THIAMINE MONONITRATE 100 MG PO TABS
100.0000 mg | ORAL_TABLET | Freq: Every day | ORAL | Status: DC
  Administered 2023-07-19 – 2023-07-21 (×3): 100 mg via ORAL
  Filled 2023-07-19 (×3): qty 1

## 2023-07-19 MED ORDER — MORPHINE SULFATE (PF) 4 MG/ML IV SOLN
4.0000 mg | Freq: Once | INTRAVENOUS | Status: AC
  Administered 2023-07-19: 4 mg via INTRAVENOUS
  Filled 2023-07-19: qty 1

## 2023-07-19 MED ORDER — FOLIC ACID 1 MG PO TABS
1.0000 mg | ORAL_TABLET | Freq: Every day | ORAL | Status: DC
  Administered 2023-07-19 – 2023-07-21 (×3): 1 mg via ORAL
  Filled 2023-07-19 (×3): qty 1

## 2023-07-19 MED ORDER — HYDROCODONE-ACETAMINOPHEN 5-325 MG PO TABS
1.0000 | ORAL_TABLET | Freq: Four times a day (QID) | ORAL | Status: DC | PRN
  Administered 2023-07-19 – 2023-07-21 (×4): 1 via ORAL
  Filled 2023-07-19 (×4): qty 1

## 2023-07-19 MED ORDER — VERAPAMIL HCL ER 180 MG PO TBCR
180.0000 mg | EXTENDED_RELEASE_TABLET | Freq: Every day | ORAL | Status: DC
  Administered 2023-07-19 – 2023-07-20 (×2): 180 mg via ORAL
  Filled 2023-07-19 (×3): qty 1

## 2023-07-19 MED ORDER — DICLOFENAC SODIUM 1 % EX GEL
2.0000 g | Freq: Three times a day (TID) | CUTANEOUS | Status: DC
  Administered 2023-07-19 – 2023-07-21 (×5): 2 g via TOPICAL
  Filled 2023-07-19: qty 100

## 2023-07-19 MED ORDER — SODIUM CHLORIDE 0.9 % IV SOLN
500.0000 mg | INTRAVENOUS | Status: DC
  Administered 2023-07-20: 500 mg via INTRAVENOUS
  Filled 2023-07-19: qty 5

## 2023-07-19 MED ORDER — SODIUM CHLORIDE 0.9 % IV SOLN
1.0000 g | INTRAVENOUS | Status: DC
  Administered 2023-07-19 – 2023-07-20 (×2): 1 g via INTRAVENOUS
  Filled 2023-07-19 (×3): qty 10

## 2023-07-19 MED ORDER — INSULIN ASPART 100 UNIT/ML IJ SOLN
0.0000 [IU] | Freq: Three times a day (TID) | INTRAMUSCULAR | Status: DC
  Administered 2023-07-19 (×2): 3 [IU] via SUBCUTANEOUS
  Administered 2023-07-20 (×3): 2 [IU] via SUBCUTANEOUS
  Administered 2023-07-21: 3 [IU] via SUBCUTANEOUS

## 2023-07-19 MED ORDER — ACETAMINOPHEN 650 MG RE SUPP: 650.0000 mg | Freq: Four times a day (QID) | RECTAL | Status: DC | PRN

## 2023-07-19 MED ORDER — RISAQUAD PO CAPS
2.0000 | ORAL_CAPSULE | Freq: Every day | ORAL | Status: DC
  Administered 2023-07-19 – 2023-07-21 (×3): 2 via ORAL
  Filled 2023-07-19 (×3): qty 2

## 2023-07-19 MED ORDER — IOHEXOL 350 MG/ML SOLN
75.0000 mL | Freq: Once | INTRAVENOUS | Status: AC | PRN
  Administered 2023-07-19: 75 mL via INTRAVENOUS

## 2023-07-19 MED ORDER — ASPIRIN 81 MG PO TBEC
81.0000 mg | DELAYED_RELEASE_TABLET | Freq: Every day | ORAL | Status: DC
  Administered 2023-07-19 – 2023-07-21 (×3): 81 mg via ORAL
  Filled 2023-07-19 (×3): qty 1

## 2023-07-19 MED ORDER — ATORVASTATIN CALCIUM 10 MG PO TABS
20.0000 mg | ORAL_TABLET | Freq: Every day | ORAL | Status: DC
  Administered 2023-07-19 – 2023-07-21 (×3): 20 mg via ORAL
  Filled 2023-07-19 (×2): qty 2
  Filled 2023-07-19: qty 1

## 2023-07-19 MED ORDER — LOPERAMIDE HCL 2 MG PO CAPS: 2.0000 mg | ORAL_CAPSULE | ORAL | Status: DC | PRN

## 2023-07-19 MED ORDER — INSULIN ASPART 100 UNIT/ML IJ SOLN: 0.0000 [IU] | Freq: Every day | INTRAMUSCULAR | Status: DC

## 2023-07-19 MED ORDER — MOLNUPIRAVIR EUA 200MG CAPSULE
4.0000 | ORAL_CAPSULE | Freq: Two times a day (BID) | ORAL | Status: DC
  Administered 2023-07-19 – 2023-07-21 (×5): 800 mg via ORAL

## 2023-07-19 MED ORDER — SODIUM CHLORIDE 0.9 % IV SOLN
500.0000 mg | Freq: Once | INTRAVENOUS | Status: AC
  Administered 2023-07-19: 500 mg via INTRAVENOUS
  Filled 2023-07-19: qty 5

## 2023-07-19 MED ORDER — CALCIUM GLUCONATE-NACL 1-0.675 GM/50ML-% IV SOLN
1.0000 g | Freq: Once | INTRAVENOUS | Status: AC
  Administered 2023-07-19: 1000 mg via INTRAVENOUS
  Filled 2023-07-19: qty 50

## 2023-07-19 MED ORDER — ONDANSETRON HCL 4 MG/2ML IJ SOLN
4.0000 mg | Freq: Once | INTRAMUSCULAR | Status: AC | PRN
  Administered 2023-07-19: 4 mg via INTRAVENOUS
  Filled 2023-07-19: qty 2

## 2023-07-19 MED ORDER — ACETAMINOPHEN 325 MG PO TABS
650.0000 mg | ORAL_TABLET | Freq: Four times a day (QID) | ORAL | Status: DC | PRN
  Administered 2023-07-19 – 2023-07-20 (×2): 650 mg via ORAL
  Filled 2023-07-19 (×2): qty 2

## 2023-07-19 MED ORDER — NON FORMULARY: 800.0000 mg | Freq: Two times a day (BID) | Status: DC

## 2023-07-19 MED ORDER — ALBUTEROL SULFATE (2.5 MG/3ML) 0.083% IN NEBU: 2.5000 mg | INHALATION_SOLUTION | Freq: Four times a day (QID) | RESPIRATORY_TRACT | Status: DC | PRN

## 2023-07-19 MED ORDER — ADULT MULTIVITAMIN W/MINERALS CH
1.0000 | ORAL_TABLET | Freq: Every day | ORAL | Status: DC
  Administered 2023-07-19 – 2023-07-21 (×3): 1 via ORAL
  Filled 2023-07-19 (×3): qty 1

## 2023-07-19 MED ORDER — OXYCODONE HCL 5 MG PO TABS
5.0000 mg | ORAL_TABLET | Freq: Once | ORAL | Status: AC
  Administered 2023-07-19: 5 mg via ORAL
  Filled 2023-07-19: qty 1

## 2023-07-19 MED ORDER — LACTATED RINGERS IV BOLUS
1000.0000 mL | Freq: Once | INTRAVENOUS | Status: AC
  Administered 2023-07-19: 1000 mL via INTRAVENOUS

## 2023-07-19 MED ORDER — SACCHAROMYCES BOULARDII 250 MG PO CAPS
250.0000 mg | ORAL_CAPSULE | Freq: Two times a day (BID) | ORAL | Status: DC
  Filled 2023-07-19: qty 1

## 2023-07-19 MED ORDER — BUPROPION HCL ER (SR) 100 MG PO TB12
100.0000 mg | ORAL_TABLET | Freq: Every day | ORAL | Status: DC
  Administered 2023-07-19 – 2023-07-21 (×3): 100 mg via ORAL
  Filled 2023-07-19 (×3): qty 1

## 2023-07-19 MED ORDER — ENOXAPARIN SODIUM 40 MG/0.4ML IJ SOSY
40.0000 mg | PREFILLED_SYRINGE | INTRAMUSCULAR | Status: DC
  Administered 2023-07-19 – 2023-07-20 (×2): 40 mg via SUBCUTANEOUS
  Filled 2023-07-19 (×2): qty 0.4

## 2023-07-19 MED ORDER — SODIUM CHLORIDE 0.9% FLUSH
3.0000 mL | Freq: Two times a day (BID) | INTRAVENOUS | Status: DC
  Administered 2023-07-19 – 2023-07-21 (×4): 3 mL via INTRAVENOUS

## 2023-07-19 NOTE — ED Notes (Signed)
Notified 3E of pt being transported to inpatient bed

## 2023-07-19 NOTE — ED Notes (Signed)
Called lab at this time to have A1c added to previous collection. Per lab, they will add it on.

## 2023-07-19 NOTE — ED Provider Notes (Signed)
[place] EMERGENCY DEPARTMENT AT [facility] Provider Note   CSN: [id number] Arrival date & time: 07/19/23  0549     History  Chief Complaint  Patient presents with   Emesis   Fall    Tara Guerrero is a 77 y.o. female.  Patient presents from home after syncopal episode.  She was diagnosed with COVID yesterday at [facility] and placed on loop revere as well as Augmentin.  She has had 1 dose of each.  She developed URI symptoms about 1 day ago.  She was going to the bathroom to get a glass of water when she felt weak and lightheaded and passed out striking her face and hands.  Denies any room spinning dizziness but did feel lightheaded.  No chest pain, shortness of breath, sweating, nausea or vomiting.  Complains of pain to her bilateral hands as well as her face and nose.  Does not take any blood thinners.  Did have 1 episode of vomiting on arrival.  Has had several episodes of diarrhea at home secondary to her COVID.  Denies any chest pain or difficulty breathing. Denies any neck or back pain.  Denies any leg pain or leg swelling. No history of recurrent syncope.  The history is provided by the EMS personnel and the patient.  Emesis Associated symptoms: arthralgias, chills, cough, headaches, myalgias and sore throat   Associated symptoms: no abdominal pain and no fever   Fall Associated symptoms include headaches. Pertinent negatives include no chest pain, no abdominal pain and no shortness of breath.       Home Medications Prior to Admission medications   Medication Sig Start Date End Date Taking? Authorizing Provider  acetaminophen (TYLENOL) 500 MG tablet Take 500 mg by mouth every 6 (six) hours as needed for moderate pain.    [provider]  amoxicillin-clavulanate (AUGMENTIN) 875-125 MG tablet Take 1 tablet by mouth every 12 (twelve) hours for 5 days. 07/18/23 07/23/23  Rexford Maus, DO  APPLE CIDER VINEGAR PO Take by mouth.    [provider]  Ascorbic Acid (VITAMIN C) 1000 MG tablet Take 1,000 mg by mouth 2 (two) times a week.    [provider]  aspirin EC 81 MG tablet Take 81 mg by mouth daily.    [provider]  atorvastatin (LIPITOR) 20 MG tablet Take 1 tablet (20 mg total) by mouth daily. 12/31/22   Deeann Saint, MD  azithromycin (ZITHROMAX) 250 MG tablet Take 1 tablet (250 mg total) by mouth daily for 4 days. Take first 2 tablets together, then 1 every day until finished. 07/18/23 07/22/23  Elayne Snare K, DO  Blood Glucose Monitoring Suppl (ONE TOUCH ULTRA 2) w/Device KIT Use to Check Blood glucose daily.( Dx:Z51.81) 01/31/19   Deeann Saint, MD  Blood Glucose Monitoring Suppl DEVI 1 each by Does not apply route in the morning, at noon, and at bedtime. May substitute to any manufacturer covered by patient's insurance. 02/27/23   Deeann Saint, MD  buPROPion ER [place] SR) 100 MG 12 hr tablet Take 1 tablet (100 mg total) by mouth daily. 12/31/22   Deeann Saint, MD  Calcium Carb-Cholecalciferol (CALCIUM 600 + D PO) Take 1 tablet by mouth 2 (two) times a week.    [provider]  cetirizine (ZYRTEC) 10 MG tablet Take 10 mg by mouth daily.    [provider]  cycloSPORINE (RESTASIS) 0.05 % ophthalmic emulsion Place 1 drop into both eyes every  12 (twelve) hours. 03/30/23     Empagliflozin-metFORMIN HCl (SYNJARDY) 12.04-999 MG TABS Take 1 tablet by mouth 2 (two) times daily. 12/31/22   Deeann Saint, MD  glipiZIDE (GLUCOTROL) 5 MG tablet Take 1 tablet by mouth daily. 06/22/23   Deeann Saint, MD  Lancets [facility] DELICA PLUS [place]) MISC Use to check blood sugar up to 3 times daily. 04/13/23   Deeann Saint, MD  molnupiravir EUA (LAGEVRIO) 200 mg CAPS capsule Take 4 capsules (800 mg total) by mouth 2 (two) times daily for 5 days. 07/18/23 07/23/23  Rexford Maus, DO  Multiple Vitamin (MULTIVITAMIN WITH MINERALS) TABS tablet Take 1 tablet by mouth 2 (two) times  a week.    [provider]  ramipril (ALTACE) 10 MG capsule TAKE 1 CAPSULE BY MOUTH  DAILY 02/13/23   Deeann Saint, MD  verapamil (CALAN-SR) 180 MG CR tablet Take 1 tablet (180 mg total) by mouth at bedtime. 06/22/23   Deeann Saint, MD      Allergies    Codeine    Review of Systems   Review of Systems  Constitutional:  Positive for activity change, appetite change, chills and fatigue. Negative for fever.  HENT:  Positive for congestion, rhinorrhea and sore throat.   Respiratory:  Positive for cough. Negative for chest tightness and shortness of breath.   Cardiovascular:  Negative for chest pain and leg swelling.  Gastrointestinal:  Positive for vomiting. Negative for abdominal pain and nausea.  Genitourinary:  Negative for dysuria and hematuria.  Musculoskeletal:  Positive for arthralgias and myalgias.  Skin:  Negative for rash.  Neurological:  Positive for dizziness, syncope, weakness, light-headedness and headaches.   all other systems are negative except as noted in the HPI and PMH.    Physical Exam Updated Vital Signs BP 128/61 (BP Location: Right Arm)   Pulse 65   Temp 98 F (36.7 C)   Resp 17   Ht 5\' 5"  (1.651 m)   Wt 83.9 kg   SpO2 96%   BMI 30.79 kg/m  Physical Exam Vitals and nursing note reviewed.  Constitutional:      General: She is not in acute distress.    Appearance: She is well-developed.  HENT:     Head: Normocephalic.     Comments: Ecchymosis and abrasion to bridge of nose No septal hematoma or hemotympanum    Mouth/Throat:     Pharynx: No oropharyngeal exudate.  Eyes:     Conjunctiva/sclera: Conjunctivae normal.     Pupils: Pupils are equal, round, and reactive to light.  Neck:     Comments: No C spine tenderness Cardiovascular:     Rate and Rhythm: Normal rate and regular rhythm.     Heart sounds: Normal heart sounds. No murmur heard. Pulmonary:     Effort: Pulmonary effort is normal. No respiratory distress.     Breath sounds:  Normal breath sounds.  Abdominal:     Palpations: Abdomen is soft.     Tenderness: There is no abdominal tenderness. There is no guarding or rebound.  Musculoskeletal:        General: No tenderness. Normal range of motion.     Cervical back: Normal range of motion and neck supple.     Comments: Bilateral hands are sensitive to touch without bony deformity.  No snuffbox tenderness. Pain with range of motion of right elbow and right hip without bony deformity.  Skin:    General: Skin is warm.  Neurological:  Mental Status: She is alert and oriented to person, place, and time.     Cranial Nerves: No cranial nerve deficit.     Motor: No abnormal muscle tone.     Coordination: Coordination normal.     Comments:  5/5 strength throughout. CN 2-12 intact.Equal grip strength.   Psychiatric:        Behavior: Behavior normal.     ED Results / Procedures / Treatments   Labs (all labs ordered are listed, but only abnormal results are displayed) Labs Reviewed  COMPREHENSIVE METABOLIC PANEL - Abnormal; Notable for the following components:      Result Value   Glucose, Bld 213 (*)    Creatinine, Ser 1.02 (*)    Calcium 8.5 (*)    Total Protein 6.2 (*)    Albumin 3.3 (*)    GFR, Estimated 56 (*)    All other components within normal limits  CBC - Abnormal; Notable for the following components:   WBC 11.9 (*)    All other components within normal limits  LIPASE, BLOOD  URINALYSIS, ROUTINE W REFLEX MICROSCOPIC  TROPONIN I (HIGH SENSITIVITY)    EKG EKG Interpretation Date/Time:  Sunday July 19 2023 05:57:13 EDT Ventricular Rate:  67 PR Interval:  185 QRS Duration:  88 QT Interval:  400 QTC Calculation: 423 R Axis:   71  Text Interpretation: Sinus rhythm Multiple premature complexes, vent & supraven No significant change was found Confirmed by Glynn Octave [phone]) on 07/19/2023 5:58:33 AM  Radiology DG Elbow Complete Right  Result Date: 07/19/2023 CLINICAL DATA:  Fall.   Pain. EXAM: RIGHT ELBOW - COMPLETE 3+ VIEW COMPARISON:  None Available. FINDINGS: There is no evidence of fracture, dislocation, or joint effusion. There is no evidence of arthropathy or other focal bone abnormality. Soft tissues are unremarkable. IMPRESSION: Negative. Electronically Signed   By: Kennith Center M.D.   On: 07/19/2023 07:10   DG Hand Complete Right  Result Date: 07/19/2023 CLINICAL DATA:  Status post fall.  Pain. EXAM: RIGHT HAND - COMPLETE 3+ VIEW COMPARISON:  None Available. FINDINGS: No acute fracture. No subluxation or dislocation. No worrisome lytic or sclerotic osseous abnormality. Degenerative changes are seen in the first carpometacarpal joint and scattered IP joints. IMPRESSION: Degenerative changes without acute bony findings. Electronically Signed   By: Kennith Center M.D.   On: 07/19/2023 07:09   DG Hand Complete Left  Result Date: 07/19/2023 CLINICAL DATA:  Fall. EXAM: LEFT HAND - COMPLETE 3 VIEW COMPARISON:  11/26/2020 FINDINGS: No acute fracture or dislocation. Degenerative interphalangeal spurring, especially at the distal interphalangeal joints with chronic erosive changes at the middle finger. 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 IMPRESSION: No acute finding. Electronically Signed   By: Tiburcio Pea M.D.   On: 07/19/2023 07:02   DG Chest 2 View  Result Date: 07/18/2023 CLINICAL DATA:  Cough and shortness of breath. EXAM: CHEST - 2 VIEW COMPARISON:  None Available. FINDINGS: Right lung clear. Subtle airspace disease noted left base. No edema or pleural effusion. The cardiopericardial silhouette is within normal limits for size. No acute bony abnormality. IMPRESSION: Subtle airspace disease at the left base could be atelectasis or infiltrate. Electronically Signed   By: Kennith Center M.D.   On: 07/18/2023 10:39    Procedures Procedures    Medications Ordered in ED Medications  ondansetron [facility]) injection 4 mg (4 mg Intravenous Given 07/19/23 0602)   lactated ringers bolus 1,000 mL (1,000 mLs Intravenous New Bag/Given 07/19/23 0612)  ED Course/ Medical Decision Making/ A&P                             Medical Decision Making Amount and/or Complexity of Data Reviewed Independent Historian: EMS Labs: ordered. Decision-making details documented in ED Course. Radiology: ordered and independent interpretation performed. Decision-making details documented in ED Course. ECG/medicine tests: ordered and independent interpretation performed. Decision-making details documented in ED Course.  Risk Prescription drug management.   Covid Positive patient with syncopal episode preceded by lightheadedness.  No chest pain or shortness of breath.  EKG is sinus rhythm on arrival with PVCs.  No Brugada, no prolonged QT.  GCS is 15, ABCs are intact.  Vital signs are stable.  Patient does have frequent ectopy and PVCs on the monitor and had a run of bigeminy in the 40s that were not perfusing.  Patient was asymptomatic to this.  Denies chest pain or shortness of breath.  Will give judicious IV hydration, CT imaging of head, C-spine and maxillofacial structures to rule out fracture from her fall and syncope.  Also complaining of bilateral hand pain and right elbow pain.  Patient given IV fluids.  Will obtain CT imaging of head, face and C-spine.  X-rays of hand and elbow are negative.  Results reviewed interpreted by me.  Labs and CT scan pending at time of shift change.  Patient will likely need admission for syncope in the setting of COVID and dehydration.  Care transferred at shift change to Dr. Jearld Fenton.          Final Clinical Impression(s) / ED Diagnoses Final diagnoses:  None    Rx / DC Orders ED Discharge Orders     None         Roland Lipke, Jeannett Senior, MD 07/19/23 [phone]

## 2023-07-19 NOTE — H&P (Addendum)
History and Physical    Patient: Tara Guerrero JYN:[id number] DOB: 1946-06-07 DOA: 07/19/2023 DOS: the patient was seen and examined on 07/19/2023 PCP: Deeann Saint, MD  Patient coming from: Home  Chief Complaint:  Chief Complaint  Patient presents with   Emesis   Fall   HPI: Tara Guerrero is a 77 y.o. female with medical history significant of hypertension, hyperlipidemia, palpitations on verapamil, diabetes mellitus type 2, depression, and history of uterine cancer who presents after passing out this morning.  She developed a all cough 3 days ago which she reports was productive.  The following day she reported associated symptoms of fatigue, sore throat,  myalgias, and breath.  She had taken 2 home tests which were negative.  Yesterday, she took another test which was positive. She had been seen in the emergency department and [facility] yesterday due to COVID-19 pneumonia.  Chest x-ray noted subtle airspace disease at the left lung base suggestive of atelectasis or pneumonia.  She was discharged home with molnupiravir, Augmentin, and azithromycin.  Patient took her second dose of molnupiravir  yesterday evening.  She recalls waking up around 3:30 AM this morning feeling well.  After standing up she reported feeling lightheaded and went to the kitchen to get a glass of water.  However, after turning around and passed out face forward onto the floor with hands balled up.  She recalls coming to with headache along with pain in her right elbow and hands.  She is on daily aspirin, but not on any blood thinners.  In the emergency department patient was noted to be afebrile with vital signs relatively maintained.  Orthostatics vital signs did note drop in diastolic blood pressures from sitting to standing 129/64-132/54.  Labs sent for WBC 11.9, creatinine 1.02, BUN 16, calcium 8.5, high-sensitivity troponin negative x 2.   X-rays of the right elbow, right hand, and left hand did not show any acute  fractures.  Angiogram of the head, cervical spine, maxillofacial, and chest did not note any acute abnormality.  Patient has been given morphine 4 mg IV, 1 L of lactated Ringer's, Zofran, Rocephin, and azithromycin.   Review of Systems: As mentioned in the history of present illness. All other systems reviewed and are negative. 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   Depression    Diabetes mellitus without complication (HCC)    Diverticulosis    Hyperlipidemia    Hyperplastic colon polyp 11/2014   Hypertension    Sleep apnea    Past Surgical History:  Procedure Laterality Date   ABDOMINAL HYSTERECTOMY  1976   s/p uterine CA   BREAST EXCISIONAL BIOPSY Bilateral    BREAST SURGERY  1995   L breast node removal   BREAST SURGERY     R breast biopsy   CHOLECYSTECTOMY N/A 10/17/2021   Procedure: LAPAROSCOPIC CHOLECYSTECTOMY;  Surgeon: Harriette Bouillon, MD;  Location: [facility] OR;  Service: General;  Laterality: N/A;   EYE MUSCLE SURGERY  2019   EYE SURGERY  2017   bilateral cataract surgery   GALLBLADDER SURGERY     JOINT REPLACEMENT  2004   bilateral knee replacements   TONSILLECTOMY  1950   Social History:  reports that she has quit smoking. She has never used smokeless tobacco. She reports current alcohol use of about 1.0 standard drink of alcohol per week. She reports that she does not use drugs.  Allergies  Allergen Reactions   Codeine Other (  See Comments)    Agitation     Family History  Problem Relation Age of Onset   Cancer Mother    Hyperlipidemia Mother    Hypertension Mother    Stroke Mother    Breast cancer Mother        diagnosed in her 79's   Alcohol abuse Father    Cancer Sister    Hyperlipidemia Sister    Hypertension Sister    Stroke Sister    Breast cancer Sister        diagnosed in her 68's   Mental illness Maternal Grandmother    Hypertension Maternal Grandmother    Hyperlipidemia Maternal Grandmother    Mental  retardation Maternal Grandmother    Hearing loss Maternal Grandfather    Stroke Maternal Grandfather    Alcohol abuse Paternal Grandmother    Alcohol abuse Paternal Grandfather    Breast cancer Cousin        diagnosed in her 48's   Breast cancer Sister 40   Breast cancer Other        diagnosed late 20's    Prior to Admission medications   Medication Sig Start Date End Date Taking? Authorizing Provider  acetaminophen (TYLENOL) 500 MG tablet Take 500 mg by mouth every 6 (six) hours as needed for moderate pain.   Yes [provider]  amoxicillin-clavulanate (AUGMENTIN) 875-125 MG tablet Take 1 tablet by mouth every 12 (twelve) hours for 5 days. 07/18/23 07/23/23 Yes Elayne Snare K, DO  Ascorbic Acid (VITAMIN C) 1000 MG tablet Take 1,000 mg by mouth 2 (two) times a week.   Yes [provider]  aspirin EC 81 MG tablet Take 81 mg by mouth daily.   Yes [provider]  atorvastatin (LIPITOR) 20 MG tablet Take 1 tablet (20 mg total) by mouth daily. 12/31/22  Yes Deeann Saint, MD  azithromycin (ZITHROMAX) 250 MG tablet Take 1 tablet (250 mg total) by mouth daily for 4 days. Take first 2 tablets together, then 1 every day until finished. 07/18/23 07/22/23 Yes Theresia Lo, Benetta Spar K, DO  buPROPion ER [facility] SR) 100 MG 12 hr tablet Take 1 tablet (100 mg total) by mouth daily. 12/31/22  Yes Deeann Saint, MD  Calcium Carb-Cholecalciferol (CALCIUM 600 + D PO) Take 1 tablet by mouth 2 (two) times a week.   Yes [provider]  cetirizine (ZYRTEC) 10 MG tablet Take 10 mg by mouth daily.   Yes [provider]  cycloSPORINE (RESTASIS) 0.05 % ophthalmic emulsion Place 1 drop into both eyes every 12 (twelve) hours. 03/30/23  Yes   Empagliflozin-metFORMIN HCl (SYNJARDY) 12.04-999 MG TABS Take 1 tablet by mouth 2 (two) times daily. 12/31/22  Yes Deeann Saint, MD  glipiZIDE (GLUCOTROL) 5 MG tablet Take 1 tablet by mouth daily. 06/22/23  Yes Deeann Saint, MD   molnupiravir EUA (LAGEVRIO) 200 mg CAPS capsule Take 4 capsules (800 mg total) by mouth 2 (two) times daily for 5 days. 07/18/23 07/23/23 Yes Elayne Snare K, DO  Multiple Vitamin (MULTIVITAMIN WITH MINERALS) TABS tablet Take 1 tablet by mouth 2 (two) times a week.   Yes [provider]  ramipril (ALTACE) 10 MG capsule TAKE 1 CAPSULE BY MOUTH  DAILY 02/13/23  Yes Deeann Saint, MD  verapamil (CALAN-SR) 180 MG CR tablet Take 1 tablet (180 mg total) by mouth at bedtime. 06/22/23  Yes Deeann Saint, MD  APPLE CIDER VINEGAR PO Take by mouth. Patient not taking: Reported on 07/19/2023  [provider]  Blood Glucose Monitoring Suppl (ONE TOUCH ULTRA 2) w/Device KIT Use to Check Blood glucose daily.( Dx:Z51.81) 01/31/19   Deeann Saint, MD  Blood Glucose Monitoring Suppl DEVI 1 each by Does not apply route in the morning, at noon, and at bedtime. May substitute to any manufacturer covered by patient's insurance. 02/27/23   Deeann Saint, MD  Lancets [facility] DELICA PLUS [place]) MISC Use to check blood sugar up to 3 times daily. 04/13/23   Deeann Saint, MD    Physical Exam: Vitals:   07/19/23 0556 07/19/23 0645 07/19/23 0700 07/19/23 0830  BP:  (!) 137/53 (!) 126/56 (!) 122/45  Pulse:  77 77 (!) 41  Resp:  14 15 16   Temp:      SpO2:  95% 95% 94%  Weight: 83.9 kg     Height: 5\' 5"  (1.651 m)      Constitutional: Elderly female who appears to be in some distress. Eyes: PERRL, lids and conjunctivae normal ENMT: Mucous membranes are moist.  Normal dentition.  Neck: normal, supple  Respiratory: clear to auscultation bilaterally, no wheezing, no crackles. Normal respiratory effort. No accessory muscle use.  Cardiovascular: Regular rate and rhythm, no murmurs / rubs / gallops. No extremity edema. 2+ pedal pulses. No carotid bruits.  Abdomen: no tenderness, no masses palpated.  Bowel sounds positive.  Musculoskeletal: no clubbing / cyanosis. No joint deformity upper  and lower extremities.  Patient appears to have normal range of motion of both hands and elbow, but noted to be visibly in pain when doing so. Skin: no rashes, lesions, ulcers. No induration Neurologic: CN 2-12 grossly intact.   Strength 5/5 in all 4.  Psychiatric: Normal judgment and insight. Alert and oriented x 3. Normal mood.   Data Reviewed:  EKG revealed sinus rhythm at 67 bpm in ventricular bigeminy   Assessment and Plan: Syncope and collapse Acute.  Patient presents after having syncopal episode which she reported feeling lightheaded dizzy prior to his unconsciousness.  Complained of bilateral hand pain and headache.  X-rays and CT imaging did not note any acute fracture..  Orthostatic vital signs noted to be possibly positive with drop in diastolic blood pressure from sitting to standing 129/64 to 132/54.  Suspect drop in blood pressure with acute infection versus arrhythmia.  CT angiogram did not note any concern for PE.  Patient has been given 1 L of IV fluids in ED. -Admit to a cardiac telemetry bed -Up with assistance -Recheck orthostatic vital signs in a.m. -Normal saline IV fluids at 75 mL/h -Hold ramipril -PT to evaluate and treat due to weakness and syncopal event  Suspected COVID-19 pneumonia Pulmonary nodule/infiltrate Acute.  Patient reported having productive cough that started 3 days ago.  Test positive for COVID-19.  Was seen [facility] yesterday where chest x-ray noted concern for left-sided area of filtrate versus atelectasis.  She was started on molnupiravir, mitten, and azithromycin yesterday.  Noted to have 8 mm left infrahilar nodule focal infiltrate on CTA of the chest performed today.  Patient was started on Rocephin and azithromycin.  O2 saturations appear to be maintained. -COVID-19 focus order set utilized -Incentive spirometry and flutter valve -Check procalcitonin -Continue Rocephin and azithromycin -Okay to continue molnupiravir once family makes the  hospital. -Mucinex  Leukocytosis Acute.  WBC elevated 11.9, but previous have been within normal limits. -Recheck CBC tomorrow morning  Palpitations Chronic.  On admission patient noted to have been in ventricular bigeminy.  Patient followed  by cardiology in outpatient setting with previous Holter monitor noting paroxysmal atrial tachycardia, bigeminy, and trigeminy in April of this year.  No changes were made to her verapamil dose at that time. -Correct electrolyte abnormalities -Continue verapamil -Patient can follow-up  Uncontrolled diabetes mellitus type 2 with hyperglycemia, without long-term use of insulin On admission glucose elevated at 213.  Last hemoglobin A1c was 9.1 in 01/2023.  Home medication regimen includes glipizide 5 mg daily and Synjardy 11-999 mg twice daily. -Hypoglycemic protocols -Hold home oral medications -CBGs before every meal with moderate SSI -Adjust insulin regimen as needed  Renal insufficiency Creatinine 1.02 with BUN 16.  A slight increase from creatinine yesterday 0.9.  Patient has been given 1 L of IV fluids. -Continue gentle IV fluids at 75 mL/h -Recheck kidney function in a.m.  Essential hypertension Blood pressures appear currently maintained. -Resume ramipril when medically appropriate  Depression -Continue Wellbutrin  DVT prophylaxis: Lovenox Advance Care Planning:   Code Status: Full Code   Consults: None  Family Communication:    Severity of Illness: The appropriate patient status for this patient is OBSERVATION. Observation status is judged to be reasonable and necessary in order to provide the required intensity of service to ensure the patient's safety. The patient's presenting symptoms, physical exam findings, and initial radiographic and laboratory data in the context of their medical condition is felt to place them at decreased risk for further clinical deterioration. Furthermore, it is anticipated that the patient will be  medically stable for discharge from the hospital within 2 midnights of admission.   Author: Clydie Braun, MD 07/19/2023 9:43 AM  For on call review [email or web address].

## 2023-07-19 NOTE — ED Notes (Signed)
ED TO INPATIENT HANDOFF REPORT  ED Nurse Name and Phone #: Denton Ar RN [phone]  S Name/Age/Gender Tara Guerrero 77 y.o. female Room/Bed: 041C/041C  Code Status   Code Status: Full Code  Home/SNF/Other Home Patient oriented to: self, place, time, and situation Is this baseline? Yes   Triage Complete: Triage complete  Chief Complaint Syncope and collapse [R55]  Triage Note Patient arrives from home via EMS. Patient was going to the bathroom and felt weak. Patient fell and passed out landing on her face and right side. No blood thinners, daily ASA. Hands are sensitive to movement and touch. Patient received of fentanyl in route. Patient presents with bruise on nose, vomiting on arrival. Tested positive for COVID today.    Allergies Allergies  Allergen Reactions   Codeine Other (See Comments)    Agitation     Level of Care/Admitting Diagnosis ED Disposition     ED Disposition  Admit   Condition  --   Comment  Hospital Area: [facility] [[id number]]  Level of Care: Telemetry Cardiac [103]  May place patient in observation at [facility] or [facility] if equivalent level of care is available:: No  Covid Evaluation: Asymptomatic - no recent exposure (last 10 days) testing not required  Diagnosis: Syncope and collapse [780.2.ICD-9-CM]  Admitting Physician: Clydie Braun [[id number]]  Attending Physician: Clydie Braun [[id number]]          B Medical/Surgery History 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   Depression    Diabetes mellitus without complication (HCC)    Diverticulosis    Hyperlipidemia    Hyperplastic colon polyp 11/2014   Hypertension    Sleep apnea    Past Surgical History:  Procedure Laterality Date   ABDOMINAL HYSTERECTOMY  1976   s/p uterine CA   BREAST EXCISIONAL BIOPSY Bilateral    BREAST SURGERY  1995   L breast node removal   BREAST SURGERY     R breast biopsy    CHOLECYSTECTOMY N/A 10/17/2021   Procedure: LAPAROSCOPIC CHOLECYSTECTOMY;  Surgeon: Harriette Bouillon, MD;  Location: [facility] OR;  Service: General;  Laterality: N/A;   EYE MUSCLE SURGERY  2019   EYE SURGERY  2017   bilateral cataract surgery   GALLBLADDER SURGERY     JOINT REPLACEMENT  2004   bilateral knee replacements   TONSILLECTOMY  1950     A IV Location/Drains/Wounds Patient Lines/Drains/Airways Status     Active Line/Drains/Airways     Name Placement date Placement time Site Days   Peripheral IV 07/19/23 18 G Right Antecubital 07/19/23  0553  Antecubital  less than 1            Intake/Output Last 24 hours  Intake/Output Summary (Last 24 hours) at 07/19/2023 1613 Last data filed at 07/19/2023 1253 Gross per 24 hour  Intake 410.74 ml  Output --  Net 410.74 ml    Labs/Imaging Results for orders placed or performed during the hospital encounter of 07/19/23 (from the past 48 hour(s))  Urinalysis, Routine w reflex microscopic -Urine, Clean Catch     Status: Abnormal   Collection Time: 07/19/23  5:58 AM  Result Value Ref Range   Color, Urine STRAW (A) YELLOW   APPearance CLEAR CLEAR   Specific Gravity, Urine 1.022 1.005 - 1.030   pH 6.0 5.0 - 8.0   Glucose, UA >=500 (A) NEGATIVE mg/dL   Hgb urine dipstick NEGATIVE NEGATIVE  Bilirubin Urine NEGATIVE NEGATIVE   Ketones, ur 20 (A) NEGATIVE mg/dL   Protein, ur NEGATIVE NEGATIVE mg/dL   Nitrite NEGATIVE NEGATIVE   Leukocytes,Ua NEGATIVE NEGATIVE   RBC / HPF 0-5 0 - 5 RBC/hpf   WBC, UA 0-5 0 - 5 WBC/hpf   Bacteria, UA NONE SEEN NONE SEEN   Squamous Epithelial / HPF 0-5 0 - 5 /HPF    Comment: Performed at [facility], [address], [place], [place] [address]  Lipase, blood     Status: None   Collection Time: 07/19/23  6:00 AM  Result Value Ref Range   Lipase 34 11 - 51 U/L    Comment: Performed at [facility], [address], [place], [place] [address]  Comprehensive metabolic panel     Status: Abnormal    Collection Time: 07/19/23  6:00 AM  Result Value Ref Range   Sodium 135 135 - 145 mmol/L   Potassium 4.2 3.5 - 5.1 mmol/L   Chloride 102 98 - 111 mmol/L   CO2 23 22 - 32 mmol/L   Glucose, Bld 213 (H) 70 - 99 mg/dL    Comment: Glucose reference range applies only to samples taken after fasting for at least 8 hours.   BUN 16 8 - 23 mg/dL   Creatinine, Ser 6.57 (H) 0.44 - 1.00 mg/dL   Calcium 8.5 (L) 8.9 - 10.3 mg/dL   Total Protein 6.2 (L) 6.5 - 8.1 g/dL   Albumin 3.3 (L) 3.5 - 5.0 g/dL   AST 27 15 - 41 U/L   ALT 28 0 - 44 U/L   Alkaline Phosphatase 71 38 - 126 U/L   Total Bilirubin 0.6 0.3 - 1.2 mg/dL   GFR, Estimated 56 (L) >60 mL/min    Comment: (NOTE) Calculated using the CKD-EPI Creatinine Equation (2021)    Anion gap 10 5 - 15    Comment: Performed at [facility], [address], [place], [place] [address]  CBC     Status: Abnormal   Collection Time: 07/19/23  6:00 AM  Result Value Ref Range   WBC 11.9 (H) 4.0 - 10.5 K/uL   RBC 4.73 3.87 - 5.11 MIL/uL   Hemoglobin 12.3 12.0 - 15.0 g/dL   HCT 29.5 28.4 - 13.2 %   MCV 84.4 80.0 - 100.0 fL   MCH 26.0 26.0 - 34.0 pg   MCHC 30.8 30.0 - 36.0 g/dL   RDW 44.0 10.2 - 72.5 %   Platelets 234 150 - 400 K/uL   nRBC 0.0 0.0 - 0.2 %    Comment: Performed at [facility], [address], [place], [place] [address]  Troponin I (High Sensitivity)     Status: None   Collection Time: 07/19/23  6:00 AM  Result Value Ref Range   Troponin I (High Sensitivity) 5 <18 ng/L    Comment: (NOTE) Elevated high sensitivity troponin I (hsTnI) values and significant  changes across serial measurements may suggest ACS but many other  chronic and acute conditions are known to elevate hsTnI results.  Refer to the "Links" section for chest pain algorithms and additional  guidance. Performed at [facility], [address] [place], [place] [address]   Hemoglobin A1c     Status: Abnormal   Collection Time: 07/19/23  6:00 AM   Result Value Ref Range   Hgb A1c MFr Bld 7.6 (H) 4.8 - 5.6 %    Comment: (NOTE) Pre diabetes:  5.7%-6.4%  Diabetes:              >6.4%  Glycemic control for   <7.0% adults with diabetes    Mean Plasma Glucose 171.42 mg/dL    Comment: Performed at [facility], [address], [place], [place] [address]  Troponin I (High Sensitivity)     Status: None   Collection Time: 07/19/23  7:57 AM  Result Value Ref Range   Troponin I (High Sensitivity) 5 <18 ng/L    Comment: (NOTE) Elevated high sensitivity troponin I (hsTnI) values and significant  changes across serial measurements may suggest ACS but many other  chronic and acute conditions are known to elevate hsTnI results.  Refer to the "Links" section for chest pain algorithms and additional  guidance. Performed at [facility], [address] [place], [place] [address]   Magnesium     Status: None   Collection Time: 07/19/23  7:57 AM  Result Value Ref Range   Magnesium 2.0 1.7 - 2.4 mg/dL    Comment: HEMOLYSIS AT THIS LEVEL MAY AFFECT RESULT Performed at [facility], [address] [place], [place] [address]   Procalcitonin     Status: None   Collection Time: 07/19/23  7:57 AM  Result Value Ref Range   Procalcitonin <0.10 ng/mL    Comment:        Interpretation: PCT (Procalcitonin) <= 0.5 ng/mL: Systemic infection (sepsis) is not likely. Local bacterial infection is possible. (NOTE)       Sepsis PCT Algorithm           Lower Respiratory Tract                                      Infection PCT Algorithm    ----------------------------     ----------------------------         PCT < 0.25 ng/mL                PCT < 0.10 ng/mL          Strongly encourage             Strongly discourage   discontinuation of antibiotics    initiation of antibiotics    ----------------------------     -----------------------------       PCT 0.25 - 0.50 ng/mL            PCT 0.10 - 0.25 ng/mL               OR        >80% decrease in PCT            Discourage initiation of                                            antibiotics      Encourage discontinuation           of antibiotics    ----------------------------     -----------------------------         PCT >= 0.50 ng/mL              PCT 0.26 - 0.50 ng/mL               AND        <80% decrease in PCT  Encourage initiation of                                             antibiotics       Encourage continuation           of antibiotics    ----------------------------     -----------------------------        PCT >= 0.50 ng/mL                  PCT > 0.50 ng/mL               AND         increase in PCT                  Strongly encourage                                      initiation of antibiotics    Strongly encourage escalation           of antibiotics                                     -----------------------------                                           PCT <= 0.25 ng/mL                                                 OR                                        > 80% decrease in PCT                                      Discontinue / Do not initiate                                             antibiotics  Performed at [facility], [address] [place], [place] [address]   SARS Coronavirus 2 by RT PCR (hospital order, performed in [facility] hospital lab) *cepheid single result test* Anterior Nasal Swab     Status: Abnormal   Collection Time: 07/19/23 10:35 AM   Specimen: Anterior Nasal Swab  Result Value Ref Range   SARS Coronavirus 2 by RT PCR POSITIVE (A) NEGATIVE    Comment: Performed at [facility], [address], [place], [place] [address]  CBG monitoring, ED     Status: Abnormal   Collection Time: 07/19/23 10:59 AM  Result Value Ref Range   Glucose-Capillary 161 (H) 70 - 99 mg/dL    Comment: Glucose  reference range applies only to samples taken after fasting for at least 8 hours.   Comment 1 Notify RN     Comment 2 Document in Chart    CT Head Wo Contrast  Result Date: 07/19/2023 CLINICAL DATA:  Blunt facial trauma.  Fall. EXAM: CT HEAD WITHOUT CONTRAST CT MAXILLOFACIAL WITHOUT CONTRAST CT CERVICAL SPINE WITHOUT CONTRAST TECHNIQUE: Multidetector CT imaging of the head, cervical spine, and maxillofacial structures were performed using the standard protocol without intravenous contrast. Multiplanar CT image reconstructions of the cervical spine and maxillofacial structures were also generated. RADIATION DOSE REDUCTION: This exam was performed according to the departmental dose-optimization program which includes automated exposure control, adjustment of the mA and/or kV according to patient size and/or use of iterative reconstruction technique. COMPARISON:  None Available. FINDINGS: CT HEAD FINDINGS Brain: No evidence of acute infarction, hemorrhage, hydrocephalus, extra-axial collection or mass lesion/mass effect. Vascular: No hyperdense vessel or unexpected calcification. Skull: Negative for fracture or bone lesion CT MAXILLOFACIAL FINDINGS Osseous: Acute bilateral nasal bone fracture with gas on the left. Only trace displacement. No orbital or ethmoid continuation. The mandible is intact and located. Orbits: No acute finding Sinuses: Negative for hemosinus. Mild patchy mucosal thickening. Leftward nasal septal deviation and spurring Soft tissues: No opaque foreign body CT CERVICAL SPINE FINDINGS Alignment: No traumatic malalignment Skull base and vertebrae: No acute fracture. No primary bone lesion or focal pathologic process. Soft tissues and spinal canal: No prevertebral fluid or swelling. No visible canal hematoma. There is a submucosal appearing lesion at the left larynx likely arising from the cricoid cartilage without internal matrix, see 5: 63. 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 There is encroachment on the airway and ENT follow-up is recommended. Disc levels:  Generalized degenerative endplate and facet  spurring. Upper chest: No visible injury IMPRESSION: 1. No evidence of acute intracranial or cervical spine injury. 2. Bilateral nasal bone fractures with soft tissue injury on the left. No significant displacement. 3. 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 Recommend ENT follow-up. Electronically Signed   By: Tiburcio Pea M.D.   On: 07/19/2023 08:38   CT Cervical Spine Wo Contrast  Result Date: 07/19/2023 CLINICAL DATA:  Blunt facial trauma.  Fall. EXAM: CT HEAD WITHOUT CONTRAST CT MAXILLOFACIAL WITHOUT CONTRAST CT CERVICAL SPINE WITHOUT CONTRAST TECHNIQUE: Multidetector CT imaging of the head, cervical spine, and maxillofacial structures were performed using the standard protocol without intravenous contrast. Multiplanar CT image reconstructions of the cervical spine and maxillofacial structures were also generated. RADIATION DOSE REDUCTION: This exam was performed according to the departmental dose-optimization program which includes automated exposure control, adjustment of the mA and/or kV according to patient size and/or use of iterative reconstruction technique. COMPARISON:  None Available. FINDINGS: CT HEAD FINDINGS Brain: No evidence of acute infarction, hemorrhage, hydrocephalus, extra-axial collection or mass lesion/mass effect. Vascular: No hyperdense vessel or unexpected calcification. Skull: Negative for fracture or bone lesion CT MAXILLOFACIAL FINDINGS Osseous: Acute bilateral nasal bone fracture with gas on the left. Only trace displacement. No orbital or ethmoid continuation. The mandible is intact and located. Orbits: No acute finding Sinuses: Negative for hemosinus. Mild patchy mucosal thickening. Leftward nasal septal deviation and spurring Soft tissues: No opaque foreign body CT CERVICAL SPINE FINDINGS Alignment: No traumatic malalignment Skull base and vertebrae: No acute fracture. No primary bone lesion or focal pathologic process. Soft tissues  and spinal canal: No prevertebral fluid or swelling. No visible canal hematoma. There is a submucosal appearing lesion at the  left larynx likely arising from the cricoid cartilage without internal matrix, see 5: 63. 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 There is encroachment on the airway and ENT follow-up is recommended. Disc levels:  Generalized degenerative endplate and facet spurring. Upper chest: No visible injury IMPRESSION: 1. No evidence of acute intracranial or cervical spine injury. 2. Bilateral nasal bone fractures with soft tissue injury on the left. No significant displacement. 3. 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 Recommend ENT follow-up. Electronically Signed   By: Tiburcio Pea M.D.   On: 07/19/2023 08:38   CT Maxillofacial Wo Contrast  Result Date: 07/19/2023 CLINICAL DATA:  Blunt facial trauma.  Fall. EXAM: CT HEAD WITHOUT CONTRAST CT MAXILLOFACIAL WITHOUT CONTRAST CT CERVICAL SPINE WITHOUT CONTRAST TECHNIQUE: Multidetector CT imaging of the head, cervical spine, and maxillofacial structures were performed using the standard protocol without intravenous contrast. Multiplanar CT image reconstructions of the cervical spine and maxillofacial structures were also generated. RADIATION DOSE REDUCTION: This exam was performed according to the departmental dose-optimization program which includes automated exposure control, adjustment of the mA and/or kV according to patient size and/or use of iterative reconstruction technique. COMPARISON:  None Available. FINDINGS: CT HEAD FINDINGS Brain: No evidence of acute infarction, hemorrhage, hydrocephalus, extra-axial collection or mass lesion/mass effect. Vascular: No hyperdense vessel or unexpected calcification. Skull: Negative for fracture or bone lesion CT MAXILLOFACIAL FINDINGS Osseous: Acute bilateral nasal bone fracture with gas on the left. Only trace displacement. No orbital or ethmoid continuation. The  mandible is intact and located. Orbits: No acute finding Sinuses: Negative for hemosinus. Mild patchy mucosal thickening. Leftward nasal septal deviation and spurring Soft tissues: No opaque foreign body CT CERVICAL SPINE FINDINGS Alignment: No traumatic malalignment Skull base and vertebrae: No acute fracture. No primary bone lesion or focal pathologic process. Soft tissues and spinal canal: No prevertebral fluid or swelling. No visible canal hematoma. There is a submucosal appearing lesion at the left larynx likely arising from the cricoid cartilage without internal matrix, see 5: 63. 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 There is encroachment on the airway and ENT follow-up is recommended. Disc levels:  Generalized degenerative endplate and facet spurring. Upper chest: No visible injury IMPRESSION: 1. No evidence of acute intracranial or cervical spine injury. 2. Bilateral nasal bone fractures with soft tissue injury on the left. No significant displacement. 3. 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 Recommend ENT follow-up. Electronically Signed   By: Tiburcio Pea M.D.   On: 07/19/2023 08:38   CT Angio Chest PE W and/or Wo Contrast  Result Date: 07/19/2023 CLINICAL DATA:  Pulmonary embolism (PE) suspected, high prob EXAM: CT ANGIOGRAPHY CHEST WITH CONTRAST TECHNIQUE: Multidetector CT imaging of the chest was performed using the standard protocol during bolus administration of intravenous contrast. Multiplanar CT image reconstructions and MIPs were obtained to evaluate the vascular anatomy. RADIATION DOSE REDUCTION: This exam was performed according to the departmental dose-optimization program which includes automated exposure control, adjustment of the mA and/or kV according to patient size and/or use of iterative reconstruction technique. CONTRAST:  75mL OMNIPAQUE IOHEXOL 350 MG/ML SOLN COMPARISON:  None Available. FINDINGS: Cardiovascular: SVC patent. Heart size upper  limits normal. Trace pericardial fluid. The RV is nondilated. Satisfactory opacification of pulmonary arteries noted, and there is no evidence of pulmonary emboli. Scattered coronary calcifications. Adequate contrast opacification of the thoracic aorta with no evidence of dissection, aneurysm, or stenosis. There is classic 3-vessel brachiocephalic arch anatomy without proximal stenosis. Scattered  calcified plaque in the descending thoracic aorta. Mediastinum/Nodes: No mass or adenopathy. Lungs/Pleura: No significant pleural effusion. No pneumothorax. Dependent atelectasis posteriorly in both lower lobes. 8 mm left infrahilar nodule or focal infiltrate (Im86,Se6) . 4 mm perifissural nodule in the right middle lobe (Im131,Se9) . Upper Abdomen: Cholecystectomy clips. Scattered colonic diverticula. No acute findings. Musculoskeletal: Spondylitic changes in the lower cervical spine. Vertebral endplate spurring at multiple levels in the lower thoracic spine. Bilateral shoulder DJD. Review of the MIP images confirms the above findings. IMPRESSION: 1. Negative for acute PE or thoracic aortic dissection. 2. 8 mm left infrahilar nodule or focal infiltrate. Follow-up Non-contrast chest CT at 6-12 months is recommended. If the nodule is stable at time of repeat CT, then future CT at 18-24 months (from today's scan) is considered optional for low-risk patients, but is recommended for high-risk patients. This recommendation follows the consensus statement: Guidelines for Management of Incidental Pulmonary Nodules Detected on CT Images: From the Fleischner Society 2017; Radiology 2017; 284:228-243. 3. Coronary and aortic Atherosclerosis (ICD10-I70.0). Electronically Signed   By: Corlis Leak M.D.   On: 07/19/2023 08:31   DG Hip Unilat W or Wo Pelvis 2-3 Views Right  Result Date: 07/19/2023 CLINICAL DATA:  Fall. EXAM: DG HIP (WITH OR WITHOUT PELVIS) 2-3V RIGHT COMPARISON:  None Available. FINDINGS: Bones are diffusely  demineralized. Surgical clips are noted along the pelvic sidewalls bilaterally. SI joints and symphysis pubis unremarkable. AP and frog-leg lateral views of the right hip show no evidence for femoral neck fracture. No evidence for pubic ramus fracture. No worrisome lytic or sclerotic osseous abnormality. IMPRESSION: 1. No acute bony abnormality. 2. Diffuse bone demineralization. Electronically Signed   By: Kennith Center M.D.   On: 07/19/2023 07:53   DG Elbow Complete Right  Result Date: 07/19/2023 CLINICAL DATA:  Fall.  Pain. EXAM: RIGHT ELBOW - COMPLETE 3+ VIEW COMPARISON:  None Available. FINDINGS: There is no evidence of fracture, dislocation, or joint effusion. There is no evidence of arthropathy or other focal bone abnormality. Soft tissues are unremarkable. IMPRESSION: Negative. Electronically Signed   By: Kennith Center M.D.   On: 07/19/2023 07:10   DG Hand Complete Right  Result Date: 07/19/2023 CLINICAL DATA:  Status post fall.  Pain. EXAM: RIGHT HAND - COMPLETE 3+ VIEW COMPARISON:  None Available. FINDINGS: No acute fracture. No subluxation or dislocation. No worrisome lytic or sclerotic osseous abnormality. Degenerative changes are seen in the first carpometacarpal joint and scattered IP joints. IMPRESSION: Degenerative changes without acute bony findings. Electronically Signed   By: Kennith Center M.D.   On: 07/19/2023 07:09   DG Hand Complete Left  Result Date: 07/19/2023 CLINICAL DATA:  Fall. EXAM: LEFT HAND - COMPLETE 3 VIEW COMPARISON:  11/26/2020 FINDINGS: No acute fracture or dislocation. Degenerative interphalangeal spurring, especially at the distal interphalangeal joints with chronic erosive changes at the middle finger. 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 IMPRESSION: No acute finding. Electronically Signed   By: Tiburcio Pea M.D.   On: 07/19/2023 07:02   DG Chest 2 View  Result Date: 07/18/2023 CLINICAL DATA:  Cough and shortness of breath. EXAM: CHEST - 2 VIEW  COMPARISON:  None Available. FINDINGS: Right lung clear. Subtle airspace disease noted left base. No edema or pleural effusion. The cardiopericardial silhouette is within normal limits for size. No acute bony abnormality. IMPRESSION: Subtle airspace disease at the left base could be atelectasis or infiltrate. Electronically Signed   By: Kennith Center M.D.   On: 07/18/2023  10:39    Pending Labs Unresulted Labs (From admission, onward)     Start     Ordered   07/20/23 0500  CBC  Tomorrow morning,   R        07/19/23 1037   07/20/23 0500  Basic metabolic panel  Tomorrow morning,   R        07/19/23 1037            Vitals/Pain Today's Vitals   07/19/23 1200 07/19/23 1438 07/19/23 1541 07/19/23 1600  BP: (!) 126/52 (!) 143/77  (!) 139/52  Pulse:  86  87  Resp: 18 17  18   Temp:  98.7 F (37.1 C)  98.5 F (36.9 C)  TempSrc:  Oral  Oral  SpO2:  95%  94%  Weight:      Height:      PainSc:   9      Isolation Precautions Airborne and Contact precautions  Medications Medications  cefTRIAXone (ROCEPHIN) 1 g in sodium chloride 0.9 % 100 mL IVPB (0 g Intravenous Stopped 07/19/23 1242)  aspirin EC tablet 81 mg (81 mg Oral Given 07/19/23 1055)  diclofenac Sodium (VOLTAREN) 1 % topical gel 2 g (2 g Topical Given 07/19/23 1543)  verapamil (CALAN-SR) CR tablet 180 mg (has no administration in time range)  atorvastatin (LIPITOR) tablet 20 mg (20 mg Oral Given 07/19/23 1055)  buPROPion ER (WELLBUTRIN SR) 12 hr tablet 100 mg (100 mg Oral Given 07/19/23 1210)  enoxaparin (LOVENOX) injection 40 mg (has no administration in time range)  sodium chloride flush (NS) 0.9 % injection 3 mL (3 mLs Intravenous Given 07/19/23 1056)  acetaminophen (TYLENOL) tablet 650 mg (650 mg Oral Given 07/19/23 1543)    Or  acetaminophen (TYLENOL) suppository 650 mg ( Rectal See Alternative 07/19/23 1543)  albuterol (PROVENTIL) (2.5 MG/3ML) 0.083% nebulizer solution 2.5 mg (has no administration in time range)  guaiFENesin  (MUCINEX) 12 hr tablet 600 mg (600 mg Oral Given 07/19/23 1056)  multivitamin with minerals tablet 1 tablet (1 tablet Oral Given 07/19/23 1055)  folic acid (FOLVITE) tablet 1 mg (1 mg Oral Given 07/19/23 1056)  thiamine (VITAMIN B1) tablet 100 mg (100 mg Oral Given 07/19/23 1056)  insulin aspart (novoLOG) injection 0-15 Units (3 Units Subcutaneous Given 07/19/23 1105)  insulin aspart (novoLOG) injection 0-5 Units (has no administration in time range)  HYDROcodone-acetaminophen (NORCO/VICODIN) 5-325 MG per tablet 1 tablet (has no administration in time range)  molnupiravir EUA (LAGEVRIO) capsule 800 mg (800 mg Oral Given 07/19/23 1212)  loperamide (IMODIUM) capsule 2 mg (has no administration in time range)  azithromycin (ZITHROMAX) 500 mg in sodium chloride 0.9 % 250 mL IVPB (has no administration in time range)  acidophilus (RISAQUAD) capsule 2 capsule (2 capsules Oral Given 07/19/23 1210)  ondansetron (ZOFRAN) injection 4 mg (4 mg Intravenous Given 07/19/23 0602)  lactated ringers bolus 1,000 mL (0 mLs Intravenous Stopped 07/19/23 0751)  iohexol (OMNIPAQUE) 350 MG/ML injection 75 mL (75 mLs Intravenous Contrast Given 07/19/23 0818)  morphine (PF) 4 MG/ML injection 4 mg (4 mg Intravenous Given 07/19/23 0919)  calcium gluconate 1 g/ 50 mL sodium chloride IVPB (0 mg Intravenous Stopped 07/19/23 1201)  azithromycin (ZITHROMAX) 500 mg in sodium chloride 0.9 % 250 mL IVPB (0 mg Intravenous Stopped 07/19/23 1157)    Mobility walks     Focused Assessments Pulmonary Assessment Handoff:  Lung sounds: Bilateral Breath Sounds: Diminished, Clear O2 Device: Room Air      R Recommendations: See Admitting Provider  Note  Report given to:   Additional Notes: Pt A&Ox4, ambulatory, bilateral hand pain w/ swelling. I suspect this is from covid. She is very sensitive to touch on her hands. Tylenol and voltaren gel recently given. Lung sounds diminished. Covid positive onset of s/s was Thursday.

## 2023-07-19 NOTE — ED Provider Notes (Signed)
7:02 AM Assumed care of patient from off-going team. For more details, please see note from same day.  In brief, this is a 77 y.o. female 77 y.o. female w/  HTN, HLD, T2DM, h/o palpitations who was seen yesterday diagnosed w/ covid PNA. Sent out stable on molnupiravir and augmentin. At home had syncopal episode last night, faceplanted on floor. BL hand pain from landing on BL fists but not wrist pain. Had run of bigeminy in the 40s. Frequent PVCs. Not orthostatic.   Per chart review of cards note on 05/19/23, for her palpitations, she had 3 monitors for total of 35/5 days (feb-April 2024), had PACS/PVCs, ventricular bigeminy/trigeminy noted. No sustained arrhythmias.  Plan/Dispo at time of sign-out & ED Course since sign-out: [ ]  labs including trop [ ]  CTs head/face/C-spine/PE [ ]  likely admission  BP 128/61 (BP Location: Right Arm)   Pulse 65   Temp 98 F (36.7 C)   Resp 17   Ht 5\' 5"  (1.651 m)   Wt 83.9 kg   SpO2 96%   BMI 30.79 kg/m    ED Course:   Clinical Course as of 07/19/23 1006  Sun Jul 19, 2023  0815 Troponin I (High Sensitivity): 5 Trop neg [HN]  0815 Comprehensive metabolic panel(!) Unremarkable in the context of this patient's presentation  [HN]  0815 Lipase: 34 Neg [HN]  0815 CBC(!) Mild leukocytosis, otherwise unremarkable [HN]  0817 R hip, R elbow/hand, and L hand XRs neg. [HN]  0854 1. No evidence of acute intracranial or cervical spine injury. 2. Bilateral nasal bone fractures with soft tissue injury on the left. No significant displacement. 3. 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 Recommend ENT follow-up.   [HN]  0855 CT Angio Chest PE W and/or Wo Contrast 1. Negative for acute PE or thoracic aortic dissection. 2. 8 mm left infrahilar nodule or focal infiltrate. Follow-up Non-contrast chest CT at 6-12 months is recommended.   [HN]  0914 Troponin I (High Sensitivity): 5 Repeat trop flat/negative. [HN]   0914 Consulted to hospitalist for admission. Ordered ceftriaxone, azithromycin IV.  [HN]    Clinical Course User Index [HN] Loetta Rough, MD   ------------------------------- Vivi Barrack, MD Emergency Medicine  This note was created using dictation software, which may contain spelling or grammatical errors.   Loetta Rough, MD 07/19/23 1007

## 2023-07-19 NOTE — ED Notes (Signed)
Pt transported to CT ?

## 2023-07-19 NOTE — ED Triage Notes (Signed)
Patient arrives from home via EMS. Patient was going to the bathroom and felt weak. Patient fell and passed out landing on her face and right side. No blood thinners, daily ASA. Hands are sensitive to movement and touch. Patient received of fentanyl in route. Patient presents with bruise on nose, vomiting on arrival. Tested positive for COVID today.

## 2023-07-19 NOTE — ED Notes (Signed)
Back from CT

## 2023-07-20 DIAGNOSIS — R55 Syncope and collapse: Secondary | ICD-10-CM | POA: Diagnosis not present

## 2023-07-20 LAB — CBC
HCT: 37.8 % (ref 36.0–46.0)
Hemoglobin: 11.6 g/dL — ABNORMAL LOW (ref 12.0–15.0)
MCH: 25.6 pg — ABNORMAL LOW (ref 26.0–34.0)
MCHC: 30.7 g/dL (ref 30.0–36.0)
MCV: 83.4 fL (ref 80.0–100.0)
Platelets: 232 10*3/uL (ref 150–400)
RBC: 4.53 MIL/uL (ref 3.87–5.11)
RDW: 15.6 % — ABNORMAL HIGH (ref 11.5–15.5)
WBC: 8.5 10*3/uL (ref 4.0–10.5)
nRBC: 0 % (ref 0.0–0.2)

## 2023-07-20 LAB — GLUCOSE, CAPILLARY
Glucose-Capillary: 139 mg/dL — ABNORMAL HIGH (ref 70–99)
Glucose-Capillary: 148 mg/dL — ABNORMAL HIGH (ref 70–99)
Glucose-Capillary: 149 mg/dL — ABNORMAL HIGH (ref 70–99)
Glucose-Capillary: 185 mg/dL — ABNORMAL HIGH (ref 70–99)

## 2023-07-20 LAB — BASIC METABOLIC PANEL
Anion gap: 11 (ref 5–15)
BUN: 18 mg/dL (ref 8–23)
CO2: 24 mmol/L (ref 22–32)
Calcium: 8.3 mg/dL — ABNORMAL LOW (ref 8.9–10.3)
Chloride: 97 mmol/L — ABNORMAL LOW (ref 98–111)
Creatinine, Ser: 1.26 mg/dL — ABNORMAL HIGH (ref 0.44–1.00)
GFR, Estimated: 44 mL/min — ABNORMAL LOW (ref >60)
Glucose, Bld: 162 mg/dL — ABNORMAL HIGH (ref 70–99)
Potassium: 3.8 mmol/L (ref 3.5–5.1)
Sodium: 132 mmol/L — ABNORMAL LOW (ref 135–145)

## 2023-07-20 MED ORDER — AZITHROMYCIN 250 MG PO TABS: 500.0000 mg | ORAL_TABLET | Freq: Every day | ORAL | Status: DC

## 2023-07-20 NOTE — Plan of Care (Signed)
  Problem: Education: Goal: Ability to describe self-care measures that may prevent or decrease complications (Diabetes Survival Skills Education) will improve Outcome: Progressing   Problem: Coping: Goal: Ability to adjust to condition or change in health will improve Outcome: Progressing   Problem: Fluid Volume: Goal: Ability to maintain a balanced intake and output will improve Outcome: Progressing   

## 2023-07-20 NOTE — TOC Initial Note (Signed)
Transition of Care [facility]) - Initial/Assessment Note    Patient Details  Name: Tara Guerrero MRN: [id number] Date of Birth: 30-May-1946  Transition of [facility]) CM/SW Contact:    Leone Haven, RN Phone Number: 07/20/2023, 7:15 PM  Clinical Narrative:                 From home with her grand daughter, Fara Boros, she has PCP, Dr.Banks, has insurance on file, she states she currently does not have any HH services in place , she has a cpap machine at home.  She states her grand daughter will transport her home at dc and she is her support system.  She gets her medications from [facility] health outpatient pharmacy, pta she is self ambulatory.    Expected Discharge Plan: Home/Self Care Barriers to Discharge: Continued Medical Work up   Patient Goals and CMS Choice Patient states their goals for this hospitalization and ongoing recovery are:: return home with grand daughter   Choice offered to / list presented to : NA      Expected Discharge Plan and Services In-house Referral: NA Discharge Planning Services: CM Consult Post Acute Care Choice: NA Living arrangements for the past 2 months: Single Family Home                 DME Arranged: N/A DME Agency: NA       HH Arranged: NA          Prior Living Arrangements/Services Living arrangements for the past 2 months: Single Family Home Lives with:: Adult Children (grand daughter) Patient language and need for interpreter reviewed:: Yes Do you feel safe going back to the place where you live?: Yes      Need for Family Participation in Patient Care: Yes (Comment) Care giver support system in place?: Yes (comment) Current home services: DME (cpap machine) Criminal Activity/Legal Involvement Pertinent to Current Situation/Hospitalization: No - Comment as needed  Activities of Daily Living Home Assistive Devices/Equipment: None ADL Screening (condition at time of admission) Patient's cognitive ability adequate to safely  complete daily activities?: No Is the patient deaf or have difficulty hearing?: No Does the patient have difficulty seeing, even when wearing glasses/contacts?: No Does the patient have difficulty concentrating, remembering, or making decisions?: No Patient able to express need for assistance with ADLs?: Yes Does the patient have difficulty dressing or bathing?: No Independently performs ADLs?: Yes (appropriate for developmental age) Does the patient have difficulty walking or climbing stairs?: No Weakness of Legs: None Weakness of Arms/Hands: None  Permission Sought/Granted Permission sought to share information with : Case Manager Permission granted to share information with : Yes, Verbal Permission Granted              Emotional Assessment   Attitude/Demeanor/Rapport: Engaged Affect (typically observed): Appropriate Orientation: : Oriented to Self, Oriented to Place, Oriented to Situation, Oriented to  Time   Psych Involvement: No (comment)  Admission diagnosis:  Syncope and collapse [R55] Injury of head, initial encounter [S09.90XA] COVID [U07.1] Patient Active Problem List   Diagnosis Date Noted   Syncope and collapse 07/19/2023   Leukocytosis 07/19/2023   Pneumonia due to COVID-19 virus 07/19/2023   Uncontrolled type 2 diabetes mellitus with hyperglycemia, without long-term current use of insulin (HCC) 07/19/2023   Renal insufficiency 07/19/2023   Depression 07/19/2023   Palpitations 02/23/2023   Chest pain of uncertain etiology 02/23/2023   Essential hypertension 06/08/2019   Hyperlipidemia associated with type 2 diabetes mellitus (HCC) 06/08/2019  PCP:  Deeann Saint, MD Pharmacy:   [facility] [address] [place] [address] Phone: [phone] Fax: [phone]  [facility] [address] [place] - [address] [place] [address] Phone: [phone] Fax:  [phone]  [id number] [place], [place] - [address] [place] [address] Phone: [phone] Fax: [phone]  [facility] [address] [place] [address] Phone: [phone] Fax: [phone]     Social Determinants of Health (SDOH) Social History: SDOH Screenings   Food Insecurity: No Food Insecurity (07/19/2023)  Housing: Patient Declined (07/19/2023)  Transportation Needs: No Transportation Needs (07/19/2023)  Utilities: Not At Risk (07/19/2023)  Alcohol Screen: Low Risk  (02/25/2023)  Depression (PHQ2-9): Low Risk  (02/25/2023)  Financial Resource Strain: Low Risk  (02/25/2023)  Physical Activity: Sufficiently Active (02/25/2023)  Recent Concern: Physical Activity - Insufficiently Active (02/09/2023)  Social Connections: Moderately Integrated (02/25/2023)  Stress: No Stress Concern Present (02/25/2023)  Tobacco Use: Medium Risk (07/19/2023)   SDOH Interventions:     Readmission Risk Interventions     No data to display

## 2023-07-20 NOTE — Progress Notes (Signed)
PROGRESS NOTE    Tara Guerrero  RUE:[id number] DOB: [birth year] DOA: 07/19/2023 PCP: Deeann Saint, MD  Outpatient Specialists:     Brief Narrative:  Patient is a 77 year old female past medical history significant for hyperlipidemia, hypertension, palpitations, type 2 diabetes mellitus, uterine cancer and depression.  Patient was admitted with syncope and fall.  Syncopal episodes seem to be orthostatic.  Patient has recent diagnosis of COVID-19 infection, without significant symptoms.  Patient remains mildly orthostatic.  No further syncopal episodes reported.  Will start patient on IV fluids.  Patient is on Molnupiravir for COVID.  Apparently, this was started at the emergency room prior to admission.  07/20/2023: Patient seen alongside patient's nurse.  No new complaints.  Above documentation noted.  Patient is also on IV azithromycin and Rocephin due to abnormalities noted on chest x-ray.   Assessment & Plan:   Principal Problem:   Syncope and collapse Active Problems:   Essential hypertension   Palpitations   Leukocytosis   Pneumonia due to COVID-19 virus   Uncontrolled type 2 diabetes mellitus with hyperglycemia, without long-term current use of insulin (HCC)   Renal insufficiency   Depression   Syncope and collapse: -Likely secondary to orthostasis and volume depletion. -Bradycardia (heart rate of 41) has been noted. -High blood pressure to consult the cardiology/EP team. -Consider Zio patch on discharge. -Start IV fluids. -Continue to monitor orthostatic vital signs. -PT has been consulted. -Continue telemetry monitoring. -CTA PE protocol was negative. -Fall precautions. -Likely discharge back home in the next 24 hours if patient continues to improve. -Renal panel and CBC in the morning.  COVID-19 infection/pulmonary nodule/infiltrate: -Abnormal chest x-ray. -No fever or chills. -Patient continues to have productive cough. -WBC has improved from 11.9 to 8.5.   Cannot rule out initial leukocytosis being reactive. -Continue to monitor closely.  Leukocytosis: -Possibly reactive. -Abnormal chest x-ray is noted. -Patient is also on antibiotics (azithromycin and Rocephin).  Patient is also on Molnupiravir -WBC is down to normal range today.  No documented fever, however, patient reports productive cough.  Volume depletion/elevated serum creatinine: -IV fluids normal saline at 75 cc/h. -Repeat renal function in the morning. -Avoid nephrotoxins.  Hypertension: -Controlled.  Bradycardia: -Documented heart rate of 41 bpm. -Check TSH. -Low threshold to consult EP/cardiology team. -Likely discharge back home on Zio patch.  Type II diabetes mellitus: -Continue to optimize.  Depression: -Continue home medications.  DVT prophylaxis: Subcutaneous Lovenox Code Status: Full code. Family Communication:  Disposition Plan: Likely discharge home in 24 hours.   Consultants:  None for now  Procedures:  None  Antimicrobials:  IV azithromycin. IV Rocephin. Molnupiravir   Subjective: Productive cough.  Objective: Vitals:   07/20/23 0042 07/20/23 0458 07/20/23 0736 07/20/23 1114  BP: 127/64 (!) 144/69 (!) 115/56   Pulse:   75 (!) 41  Resp: 15 18 17 18   Temp: 98.1 F (36.7 C) 98.1 F (36.7 C) 98 F (36.7 C) 98.1 F (36.7 C)  TempSrc: Oral Oral Oral Oral  SpO2:   96%   Weight:  86 kg    Height:        Intake/Output Summary (Last 24 hours) at 07/20/2023 1143 Last data filed at 07/19/2023 1253 Gross per 24 hour  Intake 410.74 ml  Output --  Net 410.74 ml   Filed Weights   07/19/23 0556 07/20/23 0458  Weight: 83.9 kg 86 kg    Examination: General exam: Appears calm and comfortable  Respiratory system: Clear to auscultation.  Cardiovascular system:  S1 & S2 heard, Gastrointestinal system: Abdomen is soft and nontender.   Central nervous system: Alert and oriented. No focal neurological deficits. Extremities: No leg  edema.  Data Reviewed: I have personally reviewed following labs and imaging studies  CBC: Recent Labs  Lab 07/19/23 0600 07/20/23 0119  WBC 11.9* 8.5  HGB 12.3 11.6*  HCT 39.9 37.8  MCV 84.4 83.4  PLT 234 232   Basic Metabolic Panel: Recent Labs  Lab 07/19/23 0600 07/19/23 0757 07/20/23 0119  NA 135  --  132*  K 4.2  --  3.8  CL 102  --  97*  CO2 23  --  24  GLUCOSE 213*  --  162*  BUN 16  --  18  CREATININE 1.02*  --  1.26*  CALCIUM 8.5*  --  8.3*  MG  --  2.0  --    GFR: Estimated Creatinine Clearance: 39.9 mL/min (A) (by C-G formula based on SCr of 1.26 mg/dL (H)). Liver Function Tests: Recent Labs  Lab 07/19/23 0600  AST 27  ALT 28  ALKPHOS 71  BILITOT 0.6  PROT 6.2*  ALBUMIN 3.3*   Recent Labs  Lab 07/19/23 0600  LIPASE 34   No results for input(s): "AMMONIA" in the last 168 hours. Coagulation Profile: No results for input(s): "INR", "PROTIME" in the last 168 hours. Cardiac Enzymes: No results for input(s): "CKTOTAL", "CKMB", "CKMBINDEX", "TROPONINI" in the last 168 hours. BNP (last 3 results) No results for input(s): "PROBNP" in the last 8760 hours. HbA1C: Recent Labs    07/19/23 0600  HGBA1C 7.6*   CBG: Recent Labs  Lab 07/19/23 1059 07/19/23 1818 07/19/23 2120 07/20/23 0613 07/20/23 1126  GLUCAP 161* 157* 139* 139* 148*   Lipid Profile: No results for input(s): "CHOL", "HDL", "LDLCALC", "TRIG", "CHOLHDL", "LDLDIRECT" in the last 72 hours. Thyroid Function Tests: No results for input(s): "TSH", "T4TOTAL", "FREET4", "T3FREE", "THYROIDAB" in the last 72 hours. Anemia Panel: No results for input(s): "VITAMINB12", "FOLATE", "FERRITIN", "TIBC", "IRON", "RETICCTPCT" in the last 72 hours. Urine analysis:    Component Value Date/Time   COLORURINE STRAW (A) 07/19/2023 0558   APPEARANCEUR CLEAR 07/19/2023 0558   LABSPEC 1.022 07/19/2023 0558   PHURINE 6.0 07/19/2023 0558   GLUCOSEU >=500 (A) 07/19/2023 0558   HGBUR NEGATIVE  07/19/2023 0558   BILIRUBINUR NEGATIVE 07/19/2023 0558   BILIRUBINUR neg 04/26/2021 1358   KETONESUR 20 (A) 07/19/2023 0558   PROTEINUR NEGATIVE 07/19/2023 0558   UROBILINOGEN negative (A) 04/26/2021 1358   NITRITE NEGATIVE 07/19/2023 0558   LEUKOCYTESUR NEGATIVE 07/19/2023 0558   Sepsis Labs: @LABRCNTIP (procalcitonin:4,lacticidven:4)  ) Recent Results (from the past 240 hour(s))  SARS Coronavirus 2 by RT PCR (hospital order, performed in [facility] hospital lab) *cepheid single result test* Anterior Nasal Swab     Status: Abnormal   Collection Time: 07/19/23 10:35 AM   Specimen: Anterior Nasal Swab  Result Value Ref Range Status   SARS Coronavirus 2 by RT PCR POSITIVE (A) NEGATIVE Final    Comment: Performed at [facility], [address] [place], [place] [address]         Radiology Studies: CT Head Wo Contrast  Result Date: 07/19/2023 CLINICAL DATA:  Blunt facial trauma.  Fall. EXAM: CT HEAD WITHOUT CONTRAST CT MAXILLOFACIAL WITHOUT CONTRAST CT CERVICAL SPINE WITHOUT CONTRAST TECHNIQUE: Multidetector CT imaging of the head, cervical spine, and maxillofacial structures were performed using the standard protocol without intravenous contrast. Multiplanar CT image reconstructions of the cervical spine and  maxillofacial structures were also generated. RADIATION DOSE REDUCTION: This exam was performed according to the departmental dose-optimization program which includes automated exposure control, adjustment of the mA and/or kV according to patient size and/or use of iterative reconstruction technique. COMPARISON:  None Available. FINDINGS: CT HEAD FINDINGS Brain: No evidence of acute infarction, hemorrhage, hydrocephalus, extra-axial collection or mass lesion/mass effect. Vascular: No hyperdense vessel or unexpected calcification. Skull: Negative for fracture or bone lesion CT MAXILLOFACIAL FINDINGS Osseous: Acute bilateral nasal bone fracture with gas on the left. Only trace  displacement. No orbital or ethmoid continuation. The mandible is intact and located. Orbits: No acute finding Sinuses: Negative for hemosinus. Mild patchy mucosal thickening. Leftward nasal septal deviation and spurring Soft tissues: No opaque foreign body CT CERVICAL SPINE FINDINGS Alignment: No traumatic malalignment Skull base and vertebrae: No acute fracture. No primary bone lesion or focal pathologic process. Soft tissues and spinal canal: No prevertebral fluid or swelling. No visible canal hematoma. There is a submucosal appearing lesion at the left larynx likely arising from the cricoid cartilage without internal matrix, see 5: 63. 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 There is encroachment on the airway and ENT follow-up is recommended. Disc levels:  Generalized degenerative endplate and facet spurring. Upper chest: No visible injury IMPRESSION: 1. No evidence of acute intracranial or cervical spine injury. 2. Bilateral nasal bone fractures with soft tissue injury on the left. No significant displacement. 3. 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 Recommend ENT follow-up. Electronically Signed   By: Tiburcio Pea M.D.   On: 07/19/2023 08:38   CT Cervical Spine Wo Contrast  Result Date: 07/19/2023 CLINICAL DATA:  Blunt facial trauma.  Fall. EXAM: CT HEAD WITHOUT CONTRAST CT MAXILLOFACIAL WITHOUT CONTRAST CT CERVICAL SPINE WITHOUT CONTRAST TECHNIQUE: Multidetector CT imaging of the head, cervical spine, and maxillofacial structures were performed using the standard protocol without intravenous contrast. Multiplanar CT image reconstructions of the cervical spine and maxillofacial structures were also generated. RADIATION DOSE REDUCTION: This exam was performed according to the departmental dose-optimization program which includes automated exposure control, adjustment of the mA and/or kV according to patient size and/or use of iterative reconstruction technique.  COMPARISON:  None Available. FINDINGS: CT HEAD FINDINGS Brain: No evidence of acute infarction, hemorrhage, hydrocephalus, extra-axial collection or mass lesion/mass effect. Vascular: No hyperdense vessel or unexpected calcification. Skull: Negative for fracture or bone lesion CT MAXILLOFACIAL FINDINGS Osseous: Acute bilateral nasal bone fracture with gas on the left. Only trace displacement. No orbital or ethmoid continuation. The mandible is intact and located. Orbits: No acute finding Sinuses: Negative for hemosinus. Mild patchy mucosal thickening. Leftward nasal septal deviation and spurring Soft tissues: No opaque foreign body CT CERVICAL SPINE FINDINGS Alignment: No traumatic malalignment Skull base and vertebrae: No acute fracture. No primary bone lesion or focal pathologic process. Soft tissues and spinal canal: No prevertebral fluid or swelling. No visible canal hematoma. There is a submucosal appearing lesion at the left larynx likely arising from the cricoid cartilage without internal matrix, see 5: 63. 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 There is encroachment on the airway and ENT follow-up is recommended. Disc levels:  Generalized degenerative endplate and facet spurring. Upper chest: No visible injury IMPRESSION: 1. No evidence of acute intracranial or cervical spine injury. 2. Bilateral nasal bone fractures with soft tissue injury on the left. No significant displacement. 3. 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 Recommend ENT follow-up. Electronically Signed   By: Christiane Ha  Watts M.D.   On: 07/19/2023 08:38   CT Maxillofacial Wo Contrast  Result Date: 07/19/2023 CLINICAL DATA:  Blunt facial trauma.  Fall. EXAM: CT HEAD WITHOUT CONTRAST CT MAXILLOFACIAL WITHOUT CONTRAST CT CERVICAL SPINE WITHOUT CONTRAST TECHNIQUE: Multidetector CT imaging of the head, cervical spine, and maxillofacial structures were performed using the standard protocol without intravenous  contrast. Multiplanar CT image reconstructions of the cervical spine and maxillofacial structures were also generated. RADIATION DOSE REDUCTION: This exam was performed according to the departmental dose-optimization program which includes automated exposure control, adjustment of the mA and/or kV according to patient size and/or use of iterative reconstruction technique. COMPARISON:  None Available. FINDINGS: CT HEAD FINDINGS Brain: No evidence of acute infarction, hemorrhage, hydrocephalus, extra-axial collection or mass lesion/mass effect. Vascular: No hyperdense vessel or unexpected calcification. Skull: Negative for fracture or bone lesion CT MAXILLOFACIAL FINDINGS Osseous: Acute bilateral nasal bone fracture with gas on the left. Only trace displacement. No orbital or ethmoid continuation. The mandible is intact and located. Orbits: No acute finding Sinuses: Negative for hemosinus. Mild patchy mucosal thickening. Leftward nasal septal deviation and spurring Soft tissues: No opaque foreign body CT CERVICAL SPINE FINDINGS Alignment: No traumatic malalignment Skull base and vertebrae: No acute fracture. No primary bone lesion or focal pathologic process. Soft tissues and spinal canal: No prevertebral fluid or swelling. No visible canal hematoma. There is a submucosal appearing lesion at the left larynx likely arising from the cricoid cartilage without internal matrix, see 5: 63. 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 There is encroachment on the airway and ENT follow-up is recommended. Disc levels:  Generalized degenerative endplate and facet spurring. Upper chest: No visible injury IMPRESSION: 1. No evidence of acute intracranial or cervical spine injury. 2. Bilateral nasal bone fractures with soft tissue injury on the left. No significant displacement. 3. 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 Recommend ENT follow-up. Electronically Signed   By: Tiburcio Pea M.D.   On:  07/19/2023 08:38   CT Angio Chest PE W and/or Wo Contrast  Result Date: 07/19/2023 CLINICAL DATA:  Pulmonary embolism (PE) suspected, high prob EXAM: CT ANGIOGRAPHY CHEST WITH CONTRAST TECHNIQUE: Multidetector CT imaging of the chest was performed using the standard protocol during bolus administration of intravenous contrast. Multiplanar CT image reconstructions and MIPs were obtained to evaluate the vascular anatomy. RADIATION DOSE REDUCTION: This exam was performed according to the departmental dose-optimization program which includes automated exposure control, adjustment of the mA and/or kV according to patient size and/or use of iterative reconstruction technique. CONTRAST:  75mL OMNIPAQUE IOHEXOL 350 MG/ML SOLN COMPARISON:  None Available. FINDINGS: Cardiovascular: SVC patent. Heart size upper limits normal. Trace pericardial fluid. The RV is nondilated. Satisfactory opacification of pulmonary arteries noted, and there is no evidence of pulmonary emboli. Scattered coronary calcifications. Adequate contrast opacification of the thoracic aorta with no evidence of dissection, aneurysm, or stenosis. There is classic 3-vessel brachiocephalic arch anatomy without proximal stenosis. Scattered calcified plaque in the descending thoracic aorta. Mediastinum/Nodes: No mass or adenopathy. Lungs/Pleura: No significant pleural effusion. No pneumothorax. Dependent atelectasis posteriorly in both lower lobes. 8 mm left infrahilar nodule or focal infiltrate (Im86,Se6) . 4 mm perifissural nodule in the right middle lobe (Im131,Se9) . Upper Abdomen: Cholecystectomy clips. Scattered colonic diverticula. No acute findings. Musculoskeletal: Spondylitic changes in the lower cervical spine. Vertebral endplate spurring at multiple levels in the lower thoracic spine. Bilateral shoulder DJD. Review of the MIP images confirms the above findings. IMPRESSION: 1. Negative for  acute PE or thoracic aortic dissection. 2. 8 mm left  infrahilar nodule or focal infiltrate. Follow-up Non-contrast chest CT at 6-12 months is recommended. If the nodule is stable at time of repeat CT, then future CT at 18-24 months (from today's scan) is considered optional for low-risk patients, but is recommended for high-risk patients. This recommendation follows the consensus statement: Guidelines for Management of Incidental Pulmonary Nodules Detected on CT Images: From the Fleischner Society 2017; Radiology 2017; 284:228-243. 3. Coronary and aortic Atherosclerosis (ICD10-I70.0). Electronically Signed   By: Corlis Leak M.D.   On: 07/19/2023 08:31   DG Hip Unilat W or Wo Pelvis 2-3 Views Right  Result Date: 07/19/2023 CLINICAL DATA:  Fall. EXAM: DG HIP (WITH OR WITHOUT PELVIS) 2-3V RIGHT COMPARISON:  None Available. FINDINGS: Bones are diffusely demineralized. Surgical clips are noted along the pelvic sidewalls bilaterally. SI joints and symphysis pubis unremarkable. AP and frog-leg lateral views of the right hip show no evidence for femoral neck fracture. No evidence for pubic ramus fracture. No worrisome lytic or sclerotic osseous abnormality. IMPRESSION: 1. No acute bony abnormality. 2. Diffuse bone demineralization. Electronically Signed   By: Kennith Center M.D.   On: 07/19/2023 07:53   DG Elbow Complete Right  Result Date: 07/19/2023 CLINICAL DATA:  Fall.  Pain. EXAM: RIGHT ELBOW - COMPLETE 3+ VIEW COMPARISON:  None Available. FINDINGS: There is no evidence of fracture, dislocation, or joint effusion. There is no evidence of arthropathy or other focal bone abnormality. Soft tissues are unremarkable. IMPRESSION: Negative. Electronically Signed   By: Kennith Center M.D.   On: 07/19/2023 07:10   DG Hand Complete Right  Result Date: 07/19/2023 CLINICAL DATA:  Status post fall.  Pain. EXAM: RIGHT HAND - COMPLETE 3+ VIEW COMPARISON:  None Available. FINDINGS: No acute fracture. No subluxation or dislocation. No worrisome lytic or sclerotic osseous  abnormality. Degenerative changes are seen in the first carpometacarpal joint and scattered IP joints. IMPRESSION: Degenerative changes without acute bony findings. Electronically Signed   By: Kennith Center M.D.   On: 07/19/2023 07:09   DG Hand Complete Left  Result Date: 07/19/2023 CLINICAL DATA:  Fall. EXAM: LEFT HAND - COMPLETE 3 VIEW COMPARISON:  11/26/2020 FINDINGS: No acute fracture or dislocation. Degenerative interphalangeal spurring, especially at the distal interphalangeal joints with chronic erosive changes at the middle finger. 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 IMPRESSION: No acute finding. Electronically Signed   By: Tiburcio Pea M.D.   On: 07/19/2023 07:02        Scheduled Meds:  acidophilus  2 capsule Oral Daily   aspirin EC  81 mg Oral Daily   atorvastatin  20 mg Oral Daily   [START ON 07/21/2023] azithromycin  500 mg Oral Daily   buPROPion ER  100 mg Oral Daily   diclofenac Sodium  2 g Topical TID   enoxaparin (LOVENOX) injection  40 mg Subcutaneous Q24H   folic acid  1 mg Oral Daily   guaiFENesin  600 mg Oral BID   insulin aspart  0-15 Units Subcutaneous TID WC   insulin aspart  0-5 Units Subcutaneous QHS   lidocaine  2 patch Transdermal Daily   molnupiravir EUA  4 capsule Oral BID   multivitamin with minerals  1 tablet Oral Daily   sodium chloride flush  3 mL Intravenous Q12H   thiamine  100 mg Oral Daily   verapamil  180 mg Oral QHS   Continuous Infusions:  cefTRIAXone (ROCEPHIN)  IV 1 g (07/20/23  1124)     LOS: 0 days    Time spent: 35 minutes.    Berton Mount, MD  [facility] Pager #: [phone] 7PM-7AM contact night coverage as above

## 2023-07-20 NOTE — Evaluation (Signed)
Physical Therapy Evaluation Patient Details Name: Tara Guerrero MRN: [id number] DOB: 04-19-1946 Today's Date: 07/20/2023  History of Present Illness  Pt is a 77 y/o F admitted on 07/19/23 after presenting with c/o a fall at home. Pt with recent diagnosis of Covid-19 & PNA. Imaging negative for acute issues. PMH: HTN, HLD, palpitations on verapamil, DM2, depression, uterine CA  Clinical Impression  Pt seen for PT evaluation with pt agreeable to tx. Pt reports she lives in a 1 level town home with 3 steps without rails to enter, lives with her adult granddaughter that alternates working from home/from the office every other week. Pt reports prior to admission she was independent without AD, driving. On this date, pt is able to ambulate in room, initially holding to bed rail for support but progressing to ambulating multiple laps to door & back without BUE support without LOB. Pt reports she is mobilizing at her baseline & is hopeful to d/c home today. Pt without c/o lightheadedness/dizziness during session. Pt does not require further acute PT services at this time but would benefit from continued mobilization with nursing staff & mobility specialists. PT to complete current orders, please re-consult if new needs arise.         Assistance Recommended at Discharge PRN  If plan is discharge home, recommend the following:  Can travel by private vehicle  Assistance with cooking/housework        Equipment Recommendations None recommended by PT  Recommendations for Other Services       Functional Status Assessment Patient has not had a recent decline in their functional status     Precautions / Restrictions Precautions Precautions: None Restrictions Weight Bearing Restrictions: No      Mobility  Bed Mobility Overal bed mobility: Modified Independent             General bed mobility comments: supine>sit with HOB elevated    Transfers Overall transfer level:  Independent Equipment used: None                    Ambulation/Gait Ambulation/Gait assistance: Independent Gait Distance (Feet):  (multiple laps in room without AD without LOB, equivalent to ~80 ft) Assistive device: None Gait Pattern/deviations: Decreased step length - left, Decreased step length - right, Decreased stride length Gait velocity: slightly decreased        Stairs            Wheelchair Mobility     Tilt Bed    Modified Rankin (Stroke Patients Only)       Balance Overall balance assessment: Mild deficits observed, not formally tested   Sitting balance-Leahy Scale: Normal     Standing balance support: No upper extremity supported, During functional activity (pt initially holding to bed rail when ambulating around bed to recliner but then able to ambulate in room without UE support with independence) Standing balance-Leahy Scale: Good                               Pertinent Vitals/Pain Pain Assessment Pain Assessment: Faces Faces Pain Scale: Hurts little more Pain Location: BUE hand pain Pain Descriptors / Indicators: Discomfort Pain Intervention(s): Monitored during session    Home Living Family/patient expects to be discharged to:: Private residence Living Arrangements:  (adult granddaughter) Available Help at Discharge: Family;Available PRN/intermittently Type of Home: House (townhome) Home Access: Stairs to enter Entrance Stairs-Rails: None Entrance Stairs-Number of Steps: 3   Home  Layout: One level Home Equipment: None      Prior Function Prior Level of Function : Independent/Modified Independent;Working/employed;Driving             Mobility Comments: Pt reports "passing out" yesterday was her only fall in the past 6 months.       Hand Dominance        Extremity/Trunk Assessment   Upper Extremity Assessment Upper Extremity Assessment: Overall WFL for tasks assessed    Lower Extremity  Assessment Lower Extremity Assessment: Overall WFL for tasks assessed       Communication   Communication: No difficulties  Cognition Arousal/Alertness: Awake/alert Behavior During Therapy: WFL for tasks assessed/performed Overall Cognitive Status: Within Functional Limits for tasks assessed                                          General Comments General comments (skin integrity, edema, etc.): Max HR 99 bpm    Exercises Other Exercises Other Exercises: Pt performed 10x STS from recliner without BUE support with independence with focus on BLE strengthening & endurance training.   Assessment/Plan    PT Assessment Patient does not need any further PT services  PT Problem List         PT Treatment Interventions      PT Goals (Current goals can be found in the Care Plan section)  Acute Rehab PT Goals Patient Stated Goal: feel better, go home PT Goal Formulation: With patient Time For Goal Achievement: 08/03/23 Potential to Achieve Goals: Good    Frequency       Co-evaluation               AM-PAC PT "6 Clicks" Mobility  Outcome Measure Help needed turning from your back to your side while in a flat bed without using bedrails?: None Help needed moving from lying on your back to sitting on the side of a flat bed without using bedrails?: None Help needed moving to and from a bed to a chair (including a wheelchair)?: None Help needed standing up from a chair using your arms (e.g., wheelchair or bedside chair)?: None Help needed to walk in hospital room?: None Help needed climbing 3-5 steps with a railing? : None 6 Click Score: 24    End of Session   Activity Tolerance: Patient tolerated treatment well Patient left: in chair;with call bell/phone within reach Nurse Communication: Mobility status      Time: 1610-9604 PT Time Calculation (min) (ACUTE ONLY): 23 min   Charges:   PT Evaluation $PT Eval Low Complexity: 1 Low   PT General  Charges $$ ACUTE PT VISIT: 1 Visit         Aleda Grana, PT, DPT 07/20/23, 9:44 AM   Sandi Mariscal 07/20/2023, 9:42 AM

## 2023-07-21 DIAGNOSIS — R55 Syncope and collapse: Secondary | ICD-10-CM | POA: Diagnosis not present

## 2023-07-21 LAB — RENAL FUNCTION PANEL
Albumin: 2.9 g/dL — ABNORMAL LOW (ref 3.5–5.0)
Anion gap: 7 (ref 5–15)
BUN: 17 mg/dL (ref 8–23)
CO2: 25 mmol/L (ref 22–32)
Calcium: 8.2 mg/dL — ABNORMAL LOW (ref 8.9–10.3)
Chloride: 105 mmol/L (ref 98–111)
Creatinine, Ser: 0.92 mg/dL (ref 0.44–1.00)
GFR, Estimated: >60 mL/min (ref >60)
Glucose, Bld: 161 mg/dL — ABNORMAL HIGH (ref 70–99)
Phosphorus: 3.3 mg/dL (ref 2.5–4.6)
Potassium: 4 mmol/L (ref 3.5–5.1)
Sodium: 137 mmol/L (ref 135–145)

## 2023-07-21 LAB — CBC WITH DIFFERENTIAL/PLATELET
Abs Immature Granulocytes: 0.02 10*3/uL (ref 0.00–0.07)
Basophils Absolute: 0 10*3/uL (ref 0.0–0.1)
Basophils Relative: 0 %
Eosinophils Absolute: 0.1 10*3/uL (ref 0.0–0.5)
Eosinophils Relative: 2 %
HCT: 36.5 % (ref 36.0–46.0)
Hemoglobin: 11.7 g/dL — ABNORMAL LOW (ref 12.0–15.0)
Immature Granulocytes: 0 %
Lymphocytes Relative: 34 %
Lymphs Abs: 1.7 10*3/uL (ref 0.7–4.0)
MCH: 26.5 pg (ref 26.0–34.0)
MCHC: 32.1 g/dL (ref 30.0–36.0)
MCV: 82.6 fL (ref 80.0–100.0)
Monocytes Absolute: 0.7 10*3/uL (ref 0.1–1.0)
Monocytes Relative: 14 %
Neutro Abs: 2.4 10*3/uL (ref 1.7–7.7)
Neutrophils Relative %: 50 %
Platelets: 208 10*3/uL (ref 150–400)
RBC: 4.42 MIL/uL (ref 3.87–5.11)
RDW: 15.5 % (ref 11.5–15.5)
WBC: 5 10*3/uL (ref 4.0–10.5)
nRBC: 0 % (ref 0.0–0.2)

## 2023-07-21 LAB — GLUCOSE, CAPILLARY
Glucose-Capillary: 155 mg/dL — ABNORMAL HIGH (ref 70–99)
Glucose-Capillary: 149 mg/dL — ABNORMAL HIGH (ref 70–99)

## 2023-07-21 LAB — TSH: TSH: 2.191 u[IU]/mL (ref 0.350–4.500)

## 2023-07-21 NOTE — TOC Transition Note (Signed)
Transition of Care [facility]) - CM/SW Discharge Note   Patient Details  Name: Tara Guerrero MRN: [id number] Date of Birth: May 20, 1946  [facility] of [facility]) CM/SW Contact:  Leone Haven, RN Phone Number: 07/21/2023, 12:13 PM   Clinical Narrative:    Patient is for dc today.  No needs.     Barriers to Discharge: Continued Medical Work up   Patient Goals and CMS Choice   Choice offered to / list presented to : NA  Discharge Placement                         Discharge Plan and Services Additional resources added to the After Visit Summary for   In-house Referral: NA Discharge Planning Services: CM Consult Post Acute Care Choice: NA          DME Arranged: N/A DME Agency: NA       HH Arranged: NA          Social Determinants of Health (SDOH) Interventions SDOH Screenings   Food Insecurity: No Food Insecurity (07/19/2023)  Housing: Patient Declined (07/19/2023)  Transportation Needs: No Transportation Needs (07/19/2023)  Utilities: Not At Risk (07/19/2023)  Alcohol Screen: Low Risk  (02/25/2023)  Depression (PHQ2-9): Low Risk  (02/25/2023)  Financial Resource Strain: Low Risk  (02/25/2023)  Physical Activity: Sufficiently Active (02/25/2023)  Recent Concern: Physical Activity - Insufficiently Active (02/09/2023)  Social Connections: Moderately Integrated (02/25/2023)  Stress: No Stress Concern Present (02/25/2023)  Tobacco Use: Medium Risk (07/19/2023)     Readmission Risk Interventions     No data to display

## 2023-07-21 NOTE — Progress Notes (Signed)
Hi again this pt neighbor was going to take her home but realized that she had COVID and turned around. She is going to call someone else to see if they can transport her home.

## 2023-07-21 NOTE — Progress Notes (Signed)
Patient walked by this RN per MD request. Beginning HR was 83, patient walked from her room to the end of the hallway and back. HR went up to 93, Oxygen remained in mid 90s room air. Patient tolerated well and was holding up a conversation with RN as she was walking. MD notified. Elnita Maxwell, RN

## 2023-07-21 NOTE — Discharge Summary (Signed)
Physician Discharge Summary  Tara Guerrero RJJ:[id number] DOB: 1946-10-31 DOA: 07/19/2023  PCP: Deeann Saint, MD  Admit date: 07/19/2023  Discharge date: 07/21/2023  Admitted From:Home  Disposition:  Home  Recommendations for Outpatient Follow-up:  Follow up with PCP in 1-2 weeks Finish course of treatment with molnupiravir for COVID No need for further cardiac monitoring per cardiology as patient has had multiple home monitoring sessions this year and is no longer bradycardic Continue other home medications as prior  Home Health: None  Equipment/Devices: None  Discharge Condition:Stable  CODE STATUS: Full  Diet recommendation: Heart Healthy/carb modified  Brief/Interim Summary: Patient is a 77 year old female past medical history significant for hyperlipidemia, hypertension, palpitations, type 2 diabetes mellitus, uterine cancer and depression. Patient was admitted with syncope and fall. Syncopal episodes seem to be orthostatic. Patient has recent diagnosis of COVID-19 infection, without significant symptoms.  It appears that patient had syncopal episode secondary to COVID infection with some orthostasis.  She was started on IV fluids and is no longer orthostatic.  She did have some periods of bradycardia during this admission, but nothing significant noted on EKG.  Case discussed with cardiology and patient does not require any further cardiac monitoring on discharge and is otherwise in stable condition.  She has remained on room air as well.  Discharge Diagnoses:  Principal Problem:   Syncope and collapse Active Problems:   Pneumonia due to COVID-19 virus   Palpitations   Leukocytosis   Uncontrolled type 2 diabetes mellitus with hyperglycemia, without long-term current use of insulin (HCC)   Renal insufficiency   Essential hypertension   Depression  Principal discharge diagnosis: Syncope and collapse in the setting of COVID infection with associated orthostasis.  Mild  bradycardia-asymptomatic.  Discharge Instructions  Discharge Instructions     Diet - low sodium heart healthy   Complete by: As directed    Increase activity slowly   Complete by: As directed       Allergies as of 07/21/2023       Reactions   Codeine Other (See Comments)   Agitation         Medication List     STOP taking these medications    amoxicillin-clavulanate 875-125 MG tablet Commonly known as: AUGMENTIN   azithromycin 250 MG tablet Commonly known as: ZITHROMAX       TAKE these medications    acetaminophen 500 MG tablet Commonly known as: TYLENOL Take 500 mg by mouth every 6 (six) hours as needed for moderate pain.   APPLE CIDER VINEGAR PO Take by mouth.   aspirin EC 81 MG tablet Take 81 mg by mouth daily.   atorvastatin 20 MG tablet Commonly known as: LIPITOR Take 1 tablet (20 mg total) by mouth daily.   buPROPion ER 100 MG 12 hr tablet Commonly known as: WELLBUTRIN SR Take 1 tablet (100 mg total) by mouth daily.   CALCIUM 600 + D PO Take 1 tablet by mouth 2 (two) times a week.   cetirizine 10 MG tablet Commonly known as: ZYRTEC Take 10 mg by mouth daily.   cycloSPORINE 0.05 % ophthalmic emulsion Commonly known as: RESTASIS Place 1 drop into both eyes every 12 (twelve) hours.   glipiZIDE 5 MG tablet Commonly known as: GLUCOTROL Take 1 tablet by mouth daily.   Lagevrio 200 MG Caps capsule Generic drug: molnupiravir EUA Take 4 capsules (800 mg total) by mouth 2 (two) times daily for 5 days.   multivitamin with minerals Tabs tablet Take 1  tablet by mouth 2 (two) times a week.   ONE TOUCH ULTRA 2 w/Device Kit Use to Check Blood glucose daily.( Dx:Z51.81)   Blood Glucose Monitoring Suppl Devi 1 each by Does not apply route in the morning, at noon, and at bedtime. May substitute to any manufacturer covered by patient's insurance.   OneTouch Delica Plus Lancet33G Misc Use to check blood sugar up to 3 times daily.   ramipril 10 MG  capsule Commonly known as: ALTACE TAKE 1 CAPSULE BY MOUTH  DAILY   Synjardy 12.04-999 MG Tabs Generic drug: Empagliflozin-metFORMIN HCl Take 1 tablet by mouth 2 (two) times daily.   verapamil 180 MG CR tablet Commonly known as: CALAN-SR Take 1 tablet (180 mg total) by mouth at bedtime.   vitamin C 1000 MG tablet Take 1,000 mg by mouth 2 (two) times a week.        Follow-up Information     Deeann Saint, MD. Go on 07/30/2023.   Specialty: Family Medicine Why: @10 :30am Contact information: [address] [place] [address] [phone]                Allergies  Allergen Reactions   Codeine Other (See Comments)    Agitation     Consultations: None   Procedures/Studies: CT Head Wo Contrast  Result Date: 07/19/2023 CLINICAL DATA:  Blunt facial trauma.  Fall. EXAM: CT HEAD WITHOUT CONTRAST CT MAXILLOFACIAL WITHOUT CONTRAST CT CERVICAL SPINE WITHOUT CONTRAST TECHNIQUE: Multidetector CT imaging of the head, cervical spine, and maxillofacial structures were performed using the standard protocol without intravenous contrast. Multiplanar CT image reconstructions of the cervical spine and maxillofacial structures were also generated. RADIATION DOSE REDUCTION: This exam was performed according to the departmental dose-optimization program which includes automated exposure control, adjustment of the mA and/or kV according to patient size and/or use of iterative reconstruction technique. COMPARISON:  None Available. FINDINGS: CT HEAD FINDINGS Brain: No evidence of acute infarction, hemorrhage, hydrocephalus, extra-axial collection or mass lesion/mass effect. Vascular: No hyperdense vessel or unexpected calcification. Skull: Negative for fracture or bone lesion CT MAXILLOFACIAL FINDINGS Osseous: Acute bilateral nasal bone fracture with gas on the left. Only trace displacement. No orbital or ethmoid continuation. The mandible is intact and located. Orbits: No acute finding  Sinuses: Negative for hemosinus. Mild patchy mucosal thickening. Leftward nasal septal deviation and spurring Soft tissues: No opaque foreign body CT CERVICAL SPINE FINDINGS Alignment: No traumatic malalignment Skull base and vertebrae: No acute fracture. No primary bone lesion or focal pathologic process. Soft tissues and spinal canal: No prevertebral fluid or swelling. No visible canal hematoma. There is a submucosal appearing lesion at the left larynx likely arising from the cricoid cartilage without internal matrix, see 5: 63. 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 There is encroachment on the airway and ENT follow-up is recommended. Disc levels:  Generalized degenerative endplate and facet spurring. Upper chest: No visible injury IMPRESSION: 1. No evidence of acute intracranial or cervical spine injury. 2. Bilateral nasal bone fractures with soft tissue injury on the left. No significant displacement. 3. 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 Recommend ENT follow-up. Electronically Signed   By: Tiburcio Pea M.D.   On: 07/19/2023 08:38   CT Cervical Spine Wo Contrast  Result Date: 07/19/2023 CLINICAL DATA:  Blunt facial trauma.  Fall. EXAM: CT HEAD WITHOUT CONTRAST CT MAXILLOFACIAL WITHOUT CONTRAST CT CERVICAL SPINE WITHOUT CONTRAST TECHNIQUE: Multidetector CT imaging of the head, cervical spine, and maxillofacial structures were performed  using the standard protocol without intravenous contrast. Multiplanar CT image reconstructions of the cervical spine and maxillofacial structures were also generated. RADIATION DOSE REDUCTION: This exam was performed according to the departmental dose-optimization program which includes automated exposure control, adjustment of the mA and/or kV according to patient size and/or use of iterative reconstruction technique. COMPARISON:  None Available. FINDINGS: CT HEAD FINDINGS Brain: No evidence of acute infarction, hemorrhage,  hydrocephalus, extra-axial collection or mass lesion/mass effect. Vascular: No hyperdense vessel or unexpected calcification. Skull: Negative for fracture or bone lesion CT MAXILLOFACIAL FINDINGS Osseous: Acute bilateral nasal bone fracture with gas on the left. Only trace displacement. No orbital or ethmoid continuation. The mandible is intact and located. Orbits: No acute finding Sinuses: Negative for hemosinus. Mild patchy mucosal thickening. Leftward nasal septal deviation and spurring Soft tissues: No opaque foreign body CT CERVICAL SPINE FINDINGS Alignment: No traumatic malalignment Skull base and vertebrae: No acute fracture. No primary bone lesion or focal pathologic process. Soft tissues and spinal canal: No prevertebral fluid or swelling. No visible canal hematoma. There is a submucosal appearing lesion at the left larynx likely arising from the cricoid cartilage without internal matrix, see 5: 63. 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 There is encroachment on the airway and ENT follow-up is recommended. Disc levels:  Generalized degenerative endplate and facet spurring. Upper chest: No visible injury IMPRESSION: 1. No evidence of acute intracranial or cervical spine injury. 2. Bilateral nasal bone fractures with soft tissue injury on the left. No significant displacement. 3. 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 Recommend ENT follow-up. Electronically Signed   By: Tiburcio Pea M.D.   On: 07/19/2023 08:38   CT Maxillofacial Wo Contrast  Result Date: 07/19/2023 CLINICAL DATA:  Blunt facial trauma.  Fall. EXAM: CT HEAD WITHOUT CONTRAST CT MAXILLOFACIAL WITHOUT CONTRAST CT CERVICAL SPINE WITHOUT CONTRAST TECHNIQUE: Multidetector CT imaging of the head, cervical spine, and maxillofacial structures were performed using the standard protocol without intravenous contrast. Multiplanar CT image reconstructions of the cervical spine and maxillofacial structures were also  generated. RADIATION DOSE REDUCTION: This exam was performed according to the departmental dose-optimization program which includes automated exposure control, adjustment of the mA and/or kV according to patient size and/or use of iterative reconstruction technique. COMPARISON:  None Available. FINDINGS: CT HEAD FINDINGS Brain: No evidence of acute infarction, hemorrhage, hydrocephalus, extra-axial collection or mass lesion/mass effect. Vascular: No hyperdense vessel or unexpected calcification. Skull: Negative for fracture or bone lesion CT MAXILLOFACIAL FINDINGS Osseous: Acute bilateral nasal bone fracture with gas on the left. Only trace displacement. No orbital or ethmoid continuation. The mandible is intact and located. Orbits: No acute finding Sinuses: Negative for hemosinus. Mild patchy mucosal thickening. Leftward nasal septal deviation and spurring Soft tissues: No opaque foreign body CT CERVICAL SPINE FINDINGS Alignment: No traumatic malalignment Skull base and vertebrae: No acute fracture. No primary bone lesion or focal pathologic process. Soft tissues and spinal canal: No prevertebral fluid or swelling. No visible canal hematoma. There is a submucosal appearing lesion at the left larynx likely arising from the cricoid cartilage without internal matrix, see 5: 63. 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 There is encroachment on the airway and ENT follow-up is recommended. Disc levels:  Generalized degenerative endplate and facet spurring. Upper chest: No visible injury IMPRESSION: 1. No evidence of acute intracranial or cervical spine injury. 2. Bilateral nasal bone fractures with soft tissue injury on the left. No significant displacement. 3. Submucosal mass at the left infraglottic  airway, favor cricoid cartilage origin. Recommend ENT follow-up. Electronically Signed   By: Tiburcio Pea M.D.   On: 07/19/2023 08:38   CT Angio Chest PE W and/or Wo Contrast  Result Date: 07/19/2023 CLINICAL DATA:  Pulmonary  embolism (PE) suspected, high prob EXAM: CT ANGIOGRAPHY CHEST WITH CONTRAST TECHNIQUE: Multidetector CT imaging of the chest was performed using the standard protocol during bolus administration of intravenous contrast. Multiplanar CT image reconstructions and MIPs were obtained to evaluate the vascular anatomy. RADIATION DOSE REDUCTION: This exam was performed according to the departmental dose-optimization program which includes automated exposure control, adjustment of the mA and/or kV according to patient size and/or use of iterative reconstruction technique. CONTRAST:  75mL OMNIPAQUE IOHEXOL 350 MG/ML SOLN COMPARISON:  None Available. FINDINGS: Cardiovascular: SVC patent. Heart size upper limits normal. Trace pericardial fluid. The RV is nondilated. Satisfactory opacification of pulmonary arteries noted, and there is no evidence of pulmonary emboli. Scattered coronary calcifications. Adequate contrast opacification of the thoracic aorta with no evidence of dissection, aneurysm, or stenosis. There is classic 3-vessel brachiocephalic arch anatomy without proximal stenosis. Scattered calcified plaque in the descending thoracic aorta. Mediastinum/Nodes: No mass or adenopathy. Lungs/Pleura: No significant pleural effusion. No pneumothorax. Dependent atelectasis posteriorly in both lower lobes. 8 mm left infrahilar nodule or focal infiltrate (Im86,Se6) . 4 mm perifissural nodule in the right middle lobe (Im131,Se9) . Upper Abdomen: Cholecystectomy clips. Scattered colonic diverticula. No acute findings. Musculoskeletal: Spondylitic changes in the lower cervical spine. Vertebral endplate spurring at multiple levels in the lower thoracic spine. Bilateral shoulder DJD. Review of the MIP images confirms the above findings. IMPRESSION: 1. Negative for acute PE or thoracic aortic dissection. 2. 8 mm left infrahilar nodule or focal infiltrate. Follow-up Non-contrast chest CT at 6-12 months is recommended. If the nodule is  stable at time of repeat CT, then future CT at 18-24 months (from today's scan) is considered optional for low-risk patients, but is recommended for high-risk patients. This recommendation follows the consensus statement: Guidelines for Management of Incidental Pulmonary Nodules Detected on CT Images: From the Fleischner Society 2017; Radiology 2017; 284:228-243. 3. Coronary and aortic Atherosclerosis (ICD10-I70.0). Electronically Signed   By: Corlis Leak M.D.   On: 07/19/2023 08:31   DG Hip Unilat W or Wo Pelvis 2-3 Views Right  Result Date: 07/19/2023 CLINICAL DATA:  Fall. EXAM: DG HIP (WITH OR WITHOUT PELVIS) 2-3V RIGHT COMPARISON:  None Available. FINDINGS: Bones are diffusely demineralized. Surgical clips are noted along the pelvic sidewalls bilaterally. SI joints and symphysis pubis unremarkable. AP and frog-leg lateral views of the right hip show no evidence for femoral neck fracture. No evidence for pubic ramus fracture. No worrisome lytic or sclerotic osseous abnormality. IMPRESSION: 1. No acute bony abnormality. 2. Diffuse bone demineralization. Electronically Signed   By: Kennith Center M.D.   On: 07/19/2023 07:53   DG Elbow Complete Right  Result Date: 07/19/2023 CLINICAL DATA:  Fall.  Pain. EXAM: RIGHT ELBOW - COMPLETE 3+ VIEW COMPARISON:  None Available. FINDINGS: There is no evidence of fracture, dislocation, or joint effusion. There is no evidence of arthropathy or other focal bone abnormality. Soft tissues are unremarkable. IMPRESSION: Negative. Electronically Signed   By: Kennith Center M.D.   On: 07/19/2023 07:10   DG Hand Complete Right  Result Date: 07/19/2023 CLINICAL DATA:  Status post fall.  Pain. EXAM: RIGHT HAND - COMPLETE 3+ VIEW COMPARISON:  None Available. FINDINGS: No acute fracture. No subluxation or dislocation. No worrisome lytic or sclerotic osseous  abnormality. Degenerative changes are seen in the first carpometacarpal joint and scattered IP joints. IMPRESSION:  Degenerative changes without acute bony findings. Electronically Signed   By: Kennith Center M.D.   On: 07/19/2023 07:09   DG Hand Complete Left  Result Date: 07/19/2023 CLINICAL DATA:  Fall. EXAM: LEFT HAND - COMPLETE 3 VIEW COMPARISON:  11/26/2020 FINDINGS: No acute fracture or dislocation. Degenerative interphalangeal spurring, especially at the distal interphalangeal joints with chronic erosive changes at the middle finger. 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 IMPRESSION: No acute finding. Electronically Signed   By: Tiburcio Pea M.D.   On: 07/19/2023 07:02   DG Chest 2 View  Result Date: 07/18/2023 CLINICAL DATA:  Cough and shortness of breath. EXAM: CHEST - 2 VIEW COMPARISON:  None Available. FINDINGS: Right lung clear. Subtle airspace disease noted left base. No edema or pleural effusion. The cardiopericardial silhouette is within normal limits for size. No acute bony abnormality. IMPRESSION: Subtle airspace disease at the left base could be atelectasis or infiltrate. Electronically Signed   By: Kennith Center M.D.   On: 07/18/2023 10:39     Discharge Exam: Vitals:   07/21/23 0518 07/21/23 0942  BP: (!) 154/52 (!) 146/57  Pulse: 85 64  Resp: 18 18  Temp: 97.8 F (36.6 C) 97.6 F (36.4 C)  SpO2: 97% 97%   Vitals:   07/20/23 2120 07/21/23 0035 07/21/23 0518 07/21/23 0942  BP: (!) 125/55 (!) 154/94 (!) 154/52 (!) 146/57  Pulse:  85 85 64  Resp:  18 18 18   Temp:  97.7 F (36.5 C) 97.8 F (36.6 C) 97.6 F (36.4 C)  TempSrc:  Oral Oral Oral  SpO2:  96% 97% 97%  Weight:   85.2 kg   Height:        General: Pt is alert, awake, not in acute distress Cardiovascular: RRR, S1/S2 +, no rubs, no gallops Respiratory: CTA bilaterally, no wheezing, no rhonchi Abdominal: Soft, NT, ND, bowel sounds + Extremities: no edema, no cyanosis    The results of significant diagnostics from this hospitalization (including imaging, microbiology, ancillary and laboratory) are  listed below for reference.     Microbiology: Recent Results (from the past 240 hour(s))  SARS Coronavirus 2 by RT PCR (hospital order, performed in [facility] hospital lab) *cepheid single result test* Anterior Nasal Swab     Status: Abnormal   Collection Time: 07/19/23 10:35 AM   Specimen: Anterior Nasal Swab  Result Value Ref Range Status   SARS Coronavirus 2 by RT PCR POSITIVE (A) NEGATIVE Final    Comment: Performed at [facility], [address], [place], [place] [address]     Labs: BNP (last 3 results) No results for input(s): "BNP" in the last 8760 hours. Basic Metabolic Panel: Recent Labs  Lab 07/19/23 0600 07/19/23 0757 07/20/23 0119 07/21/23 0306  NA 135  --  132* 137  K 4.2  --  3.8 4.0  CL 102  --  97* 105  CO2 23  --  24 25  GLUCOSE 213*  --  162* 161*  BUN 16  --  18 17  CREATININE 1.02*  --  1.26* 0.92  CALCIUM 8.5*  --  8.3* 8.2*  MG  --  2.0  --   --   PHOS  --   --   --  3.3   Liver Function Tests: Recent Labs  Lab 07/19/23 0600 07/21/23 0306  AST 27  --   ALT 28  --  ALKPHOS 71  --   BILITOT 0.6  --   PROT 6.2*  --   ALBUMIN 3.3* 2.9*   Recent Labs  Lab 07/19/23 0600  LIPASE 34   No results for input(s): "AMMONIA" in the last 168 hours. CBC: Recent Labs  Lab 07/19/23 0600 07/20/23 0119 07/21/23 0306  WBC 11.9* 8.5 5.0  NEUTROABS  --   --  2.4  HGB 12.3 11.6* 11.7*  HCT 39.9 37.8 36.5  MCV 84.4 83.4 82.6  PLT 234 232 208   Cardiac Enzymes: No results for input(s): "CKTOTAL", "CKMB", "CKMBINDEX", "TROPONINI" in the last 168 hours. BNP: Invalid input(s): "POCBNP" CBG: Recent Labs  Lab 07/20/23 1126 07/20/23 1555 07/20/23 2144 07/21/23 0622 07/21/23 1128  GLUCAP 148* 149* 185* 155* 149*   D-Dimer No results for input(s): "DDIMER" in the last 72 hours. Hgb A1c Recent Labs    07/19/23 0600  HGBA1C 7.6*   Lipid Profile No results for input(s): "CHOL", "HDL", "LDLCALC", "TRIG", "CHOLHDL", "LDLDIRECT" in the  last 72 hours. Thyroid function studies Recent Labs    07/21/23 0741  TSH 2.191   Anemia work up No results for input(s): "VITAMINB12", "FOLATE", "FERRITIN", "TIBC", "IRON", "RETICCTPCT" in the last 72 hours. Urinalysis    Component Value Date/Time   COLORURINE STRAW (A) 07/19/2023 0558   APPEARANCEUR CLEAR 07/19/2023 0558   LABSPEC 1.022 07/19/2023 0558   PHURINE 6.0 07/19/2023 0558   GLUCOSEU >=500 (A) 07/19/2023 0558   HGBUR NEGATIVE 07/19/2023 0558   BILIRUBINUR NEGATIVE 07/19/2023 0558   BILIRUBINUR neg 04/26/2021 1358   KETONESUR 20 (A) 07/19/2023 0558   PROTEINUR NEGATIVE 07/19/2023 0558   UROBILINOGEN negative (A) 04/26/2021 1358   NITRITE NEGATIVE 07/19/2023 0558   LEUKOCYTESUR NEGATIVE 07/19/2023 0558   Sepsis Labs Recent Labs  Lab 07/19/23 0600 07/20/23 0119 07/21/23 0306  WBC 11.9* 8.5 5.0   Microbiology Recent Results (from the past 240 hour(s))  SARS Coronavirus 2 by RT PCR (hospital order, performed in [facility] hospital lab) *cepheid single result test* Anterior Nasal Swab     Status: Abnormal   Collection Time: 07/19/23 10:35 AM   Specimen: Anterior Nasal Swab  Result Value Ref Range Status   SARS Coronavirus 2 by RT PCR POSITIVE (A) NEGATIVE Final    Comment: Performed at [facility], [address] [place], [place] [address]     Time coordinating discharge: 35 minutes  SIGNED:   Erick Blinks, DO [facility] 07/21/2023, 11:46 AM  If 7PM-7AM, please contact night-coverage [email or web address]

## 2023-07-21 NOTE — Progress Notes (Signed)
Pt Discharge complete IV and Tele monitor Removed pt waiting on Granddaughter will pick her up at Entrance A.

## 2023-07-21 NOTE — Plan of Care (Signed)
  Problem: Education: Goal: Knowledge of risk factors and measures for prevention of condition will improve Outcome: Progressing   

## 2023-07-21 NOTE — Plan of Care (Signed)
  Problem: Education: Goal: Ability to describe self-care measures that may prevent or decrease complications (Diabetes Survival Skills Education) will improve Outcome: Adequate for Discharge Goal: Individualized Educational Video(s) Outcome: Adequate for Discharge   Problem: Coping: Goal: Ability to adjust to condition or change in health will improve Outcome: Adequate for Discharge   Problem: Fluid Volume: Goal: Ability to maintain a balanced intake and output will improve Outcome: Adequate for Discharge   Problem: Health Behavior/Discharge Planning: Goal: Ability to identify and utilize available resources and services will improve Outcome: Adequate for Discharge Goal: Ability to manage health-related needs will improve Outcome: Adequate for Discharge   Problem: Metabolic: Goal: Ability to maintain appropriate glucose levels will improve Outcome: Adequate for Discharge   Problem: Nutritional: Goal: Maintenance of adequate nutrition will improve Outcome: Adequate for Discharge Goal: Progress toward achieving an optimal weight will improve Outcome: Adequate for Discharge   Problem: Skin Integrity: Goal: Risk for impaired skin integrity will decrease Outcome: Adequate for Discharge   Problem: Tissue Perfusion: Goal: Adequacy of tissue perfusion will improve Outcome: Adequate for Discharge   Problem: Education: Goal: Knowledge of risk factors and measures for prevention of condition will improve Outcome: Adequate for Discharge   Problem: Coping: Goal: Psychosocial and spiritual needs will be supported Outcome: Adequate for Discharge   Problem: Respiratory: Goal: Will maintain a patent airway Outcome: Adequate for Discharge Goal: Complications related to the disease process, condition or treatment will be avoided or minimized Outcome: Adequate for Discharge   Problem: Education: Goal: Knowledge of General Education information will improve Description: Including  pain rating scale, medication(s)/side effects and non-pharmacologic comfort measures Outcome: Adequate for Discharge   Problem: Health Behavior/Discharge Planning: Goal: Ability to manage health-related needs will improve Outcome: Adequate for Discharge   Problem: Clinical Measurements: Goal: Ability to maintain clinical measurements within normal limits will improve Outcome: Adequate for Discharge Goal: Will remain free from infection Outcome: Adequate for Discharge Goal: Diagnostic test results will improve Outcome: Adequate for Discharge Goal: Respiratory complications will improve Outcome: Adequate for Discharge Goal: Cardiovascular complication will be avoided Outcome: Adequate for Discharge   Problem: Activity: Goal: Risk for activity intolerance will decrease Outcome: Adequate for Discharge   Problem: Nutrition: Goal: Adequate nutrition will be maintained Outcome: Adequate for Discharge   Problem: Coping: Goal: Level of anxiety will decrease Outcome: Adequate for Discharge   Problem: Elimination: Goal: Will not experience complications related to bowel motility Outcome: Adequate for Discharge Goal: Will not experience complications related to urinary retention Outcome: Adequate for Discharge   Problem: Pain Managment: Goal: General experience of comfort will improve Outcome: Adequate for Discharge   Problem: Safety: Goal: Ability to remain free from injury will improve Outcome: Adequate for Discharge   Problem: Skin Integrity: Goal: Risk for impaired skin integrity will decrease Outcome: Adequate for Discharge   

## 2023-07-30 ENCOUNTER — Other Ambulatory Visit (HOSPITAL_COMMUNITY): Payer: Self-pay

## 2023-07-30 ENCOUNTER — Encounter: Payer: Self-pay | Admitting: Family Medicine

## 2023-07-30 ENCOUNTER — Ambulatory Visit (INDEPENDENT_AMBULATORY_CARE_PROVIDER_SITE_OTHER): Payer: PPO | Admitting: Family Medicine

## 2023-07-30 VITALS — BP 114/60 | HR 62 | Temp 97.2°F | Wt 189.2 lb

## 2023-07-30 DIAGNOSIS — E119 Type 2 diabetes mellitus without complications: Secondary | ICD-10-CM

## 2023-07-30 DIAGNOSIS — J1282 Pneumonia due to coronavirus disease 2019: Secondary | ICD-10-CM

## 2023-07-30 DIAGNOSIS — S022XXD Fracture of nasal bones, subsequent encounter for fracture with routine healing: Secondary | ICD-10-CM

## 2023-07-30 DIAGNOSIS — Z7984 Long term (current) use of oral hypoglycemic drugs: Secondary | ICD-10-CM | POA: Diagnosis not present

## 2023-07-30 DIAGNOSIS — R55 Syncope and collapse: Secondary | ICD-10-CM | POA: Diagnosis not present

## 2023-07-30 DIAGNOSIS — R001 Bradycardia, unspecified: Secondary | ICD-10-CM | POA: Diagnosis not present

## 2023-07-30 DIAGNOSIS — U071 COVID-19: Secondary | ICD-10-CM

## 2023-07-30 DIAGNOSIS — I10 Essential (primary) hypertension: Secondary | ICD-10-CM

## 2023-07-30 NOTE — Progress Notes (Signed)
Established Patient Office Visit   Subjective  Patient ID: Tara Guerrero, female    DOB: 1946-02-04  Age: 77 y.o. MRN: [id number]  Chief Complaint  Patient presents with   Medical Management of Chronic Issues    Covid, pneumonia and a fall. States she is doing better    HPI  Patient is a 77 year old female seen for HFU.  Patient seen in ED on 07/18/2023 with known COVID.  Given azithromycin and Augmentin for pneumonia of left lower lobe.  Also sent home with molnupiravir.  Patient return to ED and was subsequently hospitalized 7/21-7/23 for syncope and fall 2/2 orthostasis from COVID-19 infection.  CT maxillofacial with nasal fracture.  During hospitalization bradycardia noted but resolved.  Case discussed with cardiology and no further cardiac monitoring on discharge recommended.  Pt states she is doing much better since d/c.  Has an occasional cough.  States face is looking better as initially had 2 black eyes due to nasal fracture.  Patient denies any CP, palpitations, dizziness, headaches.  Patient states appetite is good.   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   Depression    Diabetes mellitus without complication (HCC)    Diverticulosis    Hyperlipidemia    Hyperplastic colon polyp 11/2014   Hypertension    Sleep apnea    Past Surgical History:  Procedure Laterality Date   ABDOMINAL HYSTERECTOMY  1976   s/p uterine CA   BREAST EXCISIONAL BIOPSY Bilateral    BREAST SURGERY  1995   L breast node removal   BREAST SURGERY     R breast biopsy   CHOLECYSTECTOMY N/A 10/17/2021   Procedure: LAPAROSCOPIC CHOLECYSTECTOMY;  Surgeon: Harriette Bouillon, MD;  Location: [facility] OR;  Service: General;  Laterality: N/A;   EYE MUSCLE SURGERY  2019   EYE SURGERY  2017   bilateral cataract surgery   GALLBLADDER SURGERY     JOINT REPLACEMENT  2004   bilateral knee replacements   TONSILLECTOMY  1950   Social History   Tobacco Use   Smoking status: Former    Smokeless tobacco: Never  Vaping Use   Vaping status: Never Used  Substance Use Topics   Alcohol use: Yes    Alcohol/week: 1.0 standard drink of alcohol    Types: 1 Glasses of wine per week    Comment: Social   Drug use: Never   Family History  Problem Relation Age of Onset   Cancer Mother    Hyperlipidemia Mother    Hypertension Mother    Stroke Mother    Breast cancer Mother        diagnosed in her 11's   Alcohol abuse Father    Cancer Sister    Hyperlipidemia Sister    Hypertension Sister    Stroke Sister    Breast cancer Sister        diagnosed in her 35's   Mental illness Maternal Grandmother    Hypertension Maternal Grandmother    Hyperlipidemia Maternal Grandmother    Mental retardation Maternal Grandmother    Hearing loss Maternal Grandfather    Stroke Maternal Grandfather    Alcohol abuse Paternal Grandmother    Alcohol abuse Paternal Grandfather    Breast cancer Cousin        diagnosed in her 74's   Breast cancer Sister 92   Breast cancer Other        diagnosed late 20's   Allergies  Allergen Reactions  Codeine Other (See Comments)    Agitation       ROS Negative unless stated above    Objective:     BP 114/60 (BP Location: Left Arm, Patient Position: Sitting, Cuff Size: Normal)   Pulse 62   Temp (!) 97.2 F (36.2 C) (Temporal)   Wt 189 lb 3.2 oz (85.8 kg)   SpO2 98%   BMI 31.48 kg/m  BP Readings from Last 3 Encounters:  07/30/23 114/60  07/21/23 (!) 146/57  07/18/23 (!) 154/71   Wt Readings from Last 3 Encounters:  07/30/23 189 lb 3.2 oz (85.8 kg)  07/21/23 187 lb 13.3 oz (85.2 kg)  05/19/23 184 lb (83.5 kg)      Physical Exam Constitutional:      General: She is not in acute distress.    Appearance: Normal appearance.  HENT:     Head: Normocephalic and atraumatic.     Comments: Ecchymosis over bridge of nose.    Nose: Nose normal.     Mouth/Throat:     Mouth: Mucous membranes are moist.  Cardiovascular:     Rate and  Rhythm: Normal rate and regular rhythm.     Heart sounds: Normal heart sounds. No murmur heard.    No gallop.     Comments: Occasional PVC Pulmonary:     Effort: Pulmonary effort is normal. No respiratory distress.     Breath sounds: Normal breath sounds. No wheezing, rhonchi or rales.  Skin:    General: Skin is warm and dry.     Comments: Well healed surgical incisions midline knees.  Neurological:     Mental Status: She is alert and oriented to person, place, and time.     No results found for any visits on 07/30/23.    Assessment & Plan:  Bradycardia -Resolved -No outpatient cardiology workup recommended given prior workup with 28-day heart monitor earlier in the year.  Closed fracture of nasal bone with routine healing, subsequent encounter  Pneumonia due to COVID-19 virus -Resolved -Completed antibiotic course  Controlled type 2 diabetes mellitus without complication, without long-term current use of insulin (HCC) -Hemoglobin A1c 7.6% on 07/19/2023 -Continue glipizide 5 mg daily, Synjardy 12.04-999 mg twice daily -Continue lifestyle modifications -Continue ACE I and statin -Foot exam up-to-date. -Patient to schedule eye exam  Essential hypertension -Controlled -Continue current medications including ramipril 10 mg daily, verapamil 180 mg nightly  Syncope and collapse -Thought 2/2 orthostasis from COVID-19 viral infection -Continue to monitor   Return if symptoms worsen or fail to improve.   Deeann Saint, MD

## 2023-07-31 ENCOUNTER — Other Ambulatory Visit (HOSPITAL_COMMUNITY): Payer: Self-pay

## 2023-07-31 ENCOUNTER — Other Ambulatory Visit (HOSPITAL_BASED_OUTPATIENT_CLINIC_OR_DEPARTMENT_OTHER): Payer: Self-pay

## 2023-07-31 ENCOUNTER — Other Ambulatory Visit: Payer: Self-pay

## 2023-08-03 ENCOUNTER — Other Ambulatory Visit: Payer: Self-pay | Admitting: Family Medicine

## 2023-08-03 ENCOUNTER — Other Ambulatory Visit (HOSPITAL_COMMUNITY): Payer: Self-pay

## 2023-08-03 DIAGNOSIS — Z1231 Encounter for screening mammogram for malignant neoplasm of breast: Secondary | ICD-10-CM

## 2023-08-04 ENCOUNTER — Other Ambulatory Visit (HOSPITAL_COMMUNITY): Payer: Self-pay

## 2023-08-13 ENCOUNTER — Other Ambulatory Visit (HOSPITAL_COMMUNITY): Payer: Self-pay

## 2023-08-18 ENCOUNTER — Other Ambulatory Visit (HOSPITAL_COMMUNITY): Payer: Self-pay

## 2023-09-01 ENCOUNTER — Ambulatory Visit
Admission: RE | Admit: 2023-09-01 | Discharge: 2023-09-01 | Disposition: A | Discharge status: Home / Self Care | Payer: PPO | Source: Ambulatory Visit

## 2023-09-01 DIAGNOSIS — Z1231 Encounter for screening mammogram for malignant neoplasm of breast: Secondary | ICD-10-CM | POA: Diagnosis not present

## 2023-09-07 ENCOUNTER — Other Ambulatory Visit (HOSPITAL_BASED_OUTPATIENT_CLINIC_OR_DEPARTMENT_OTHER): Payer: Self-pay

## 2023-09-07 ENCOUNTER — Other Ambulatory Visit: Payer: Self-pay

## 2023-09-07 ENCOUNTER — Other Ambulatory Visit (HOSPITAL_COMMUNITY): Payer: Self-pay

## 2023-10-05 ENCOUNTER — Other Ambulatory Visit: Payer: Self-pay | Admitting: Family Medicine

## 2023-10-06 ENCOUNTER — Other Ambulatory Visit (HOSPITAL_BASED_OUTPATIENT_CLINIC_OR_DEPARTMENT_OTHER): Payer: Self-pay

## 2023-10-06 ENCOUNTER — Other Ambulatory Visit: Payer: Self-pay | Admitting: Family Medicine

## 2023-10-07 ENCOUNTER — Other Ambulatory Visit (HOSPITAL_BASED_OUTPATIENT_CLINIC_OR_DEPARTMENT_OTHER): Payer: Self-pay

## 2023-10-07 ENCOUNTER — Other Ambulatory Visit (HOSPITAL_COMMUNITY): Payer: Self-pay

## 2023-10-07 MED ORDER — VERAPAMIL HCL ER 180 MG PO TBCR
180.0000 mg | EXTENDED_RELEASE_TABLET | Freq: Every evening | ORAL | 1 refills | Status: DC
  Filled 2023-10-07 – 2024-01-06 (×2): qty 90, 90d supply, fill #0

## 2023-10-07 MED ORDER — ATORVASTATIN CALCIUM 20 MG PO TABS
20.0000 mg | ORAL_TABLET | Freq: Every day | ORAL | 2 refills | Status: DC
  Filled 2023-10-07 – 2023-10-09 (×2): qty 90, 90d supply, fill #0
  Filled 2024-01-06: qty 90, 90d supply, fill #1
  Filled 2024-04-18: qty 90, 90d supply, fill #2

## 2023-10-09 ENCOUNTER — Other Ambulatory Visit (HOSPITAL_BASED_OUTPATIENT_CLINIC_OR_DEPARTMENT_OTHER): Payer: Self-pay

## 2023-10-09 DIAGNOSIS — S00411A Abrasion of right ear, initial encounter: Secondary | ICD-10-CM | POA: Insufficient documentation

## 2023-10-09 MED ORDER — CIPROFLOXACIN-DEXAMETHASONE 0.3-0.1 % OT SUSP
4.0000 [drp] | Freq: Two times a day (BID) | OTIC | 0 refills | Status: AC
  Filled 2023-10-09: qty 7.5, 10d supply, fill #0

## 2023-10-12 ENCOUNTER — Other Ambulatory Visit (HOSPITAL_COMMUNITY): Payer: Self-pay

## 2023-10-24 ENCOUNTER — Other Ambulatory Visit: Payer: Self-pay | Admitting: Family Medicine

## 2023-10-28 ENCOUNTER — Other Ambulatory Visit (HOSPITAL_BASED_OUTPATIENT_CLINIC_OR_DEPARTMENT_OTHER): Payer: Self-pay

## 2023-10-29 ENCOUNTER — Other Ambulatory Visit (HOSPITAL_BASED_OUTPATIENT_CLINIC_OR_DEPARTMENT_OTHER): Payer: Self-pay

## 2023-10-29 ENCOUNTER — Encounter: Payer: Self-pay | Admitting: Family Medicine

## 2023-10-29 ENCOUNTER — Ambulatory Visit: Payer: PPO | Admitting: Family Medicine

## 2023-10-29 VITALS — BP 124/76 | HR 80 | Temp 97.6°F | Ht 65.0 in | Wt 193.6 lb

## 2023-10-29 DIAGNOSIS — E1169 Type 2 diabetes mellitus with other specified complication: Secondary | ICD-10-CM

## 2023-10-29 DIAGNOSIS — R2990 Unspecified symptoms and signs involving the nervous system: Secondary | ICD-10-CM | POA: Diagnosis not present

## 2023-10-29 DIAGNOSIS — Z7984 Long term (current) use of oral hypoglycemic drugs: Secondary | ICD-10-CM

## 2023-10-29 DIAGNOSIS — F3341 Major depressive disorder, recurrent, in partial remission: Secondary | ICD-10-CM

## 2023-10-29 DIAGNOSIS — Z5986 Financial insecurity: Secondary | ICD-10-CM | POA: Diagnosis not present

## 2023-10-29 DIAGNOSIS — R202 Paresthesia of skin: Secondary | ICD-10-CM

## 2023-10-29 DIAGNOSIS — Z59868 Other specified financial insecurity: Secondary | ICD-10-CM

## 2023-10-29 DIAGNOSIS — U099 Post covid-19 condition, unspecified: Secondary | ICD-10-CM

## 2023-10-29 LAB — CBC WITH DIFFERENTIAL/PLATELET
Basophils Absolute: 0.1 10*3/uL (ref 0.0–0.1)
Basophils Relative: 0.9 % (ref 0.0–3.0)
Eosinophils Absolute: 0.4 10*3/uL (ref 0.0–0.7)
Eosinophils Relative: 5 % (ref 0.0–5.0)
HCT: 43.6 % (ref 36.0–46.0)
Hemoglobin: 14.1 g/dL (ref 12.0–15.0)
Lymphocytes Relative: 23.3 % (ref 12.0–46.0)
Lymphs Abs: 2 10*3/uL (ref 0.7–4.0)
MCHC: 32.4 g/dL (ref 30.0–36.0)
MCV: 82.7 fL (ref 78.0–100.0)
Monocytes Absolute: 0.8 10*3/uL (ref 0.1–1.0)
Monocytes Relative: 9.2 % (ref 3.0–12.0)
Neutro Abs: 5.4 10*3/uL (ref 1.4–7.7)
Neutrophils Relative %: 61.6 % (ref 43.0–77.0)
Platelets: 292 10*3/uL (ref 150.0–400.0)
RBC: 5.27 Mil/uL — ABNORMAL HIGH (ref 3.87–5.11)
RDW: 16.4 % — ABNORMAL HIGH (ref 11.5–15.5)
WBC: 8.8 10*3/uL (ref 4.0–10.5)

## 2023-10-29 LAB — COMPREHENSIVE METABOLIC PANEL
ALT: 25 U/L (ref 0–35)
AST: 21 U/L (ref 0–37)
Albumin: 4.4 g/dL (ref 3.5–5.2)
Alkaline Phosphatase: 78 U/L (ref 39–117)
BUN: 20 mg/dL (ref 6–23)
CO2: 30 meq/L (ref 19–32)
Calcium: 10 mg/dL (ref 8.4–10.5)
Chloride: 99 meq/L (ref 96–112)
Creatinine, Ser: 0.87 mg/dL (ref 0.40–1.20)
GFR: 63.55 mL/min (ref >60.00)
Glucose, Bld: 141 mg/dL — ABNORMAL HIGH (ref 70–99)
Potassium: 4.2 meq/L (ref 3.5–5.1)
Sodium: 139 meq/L (ref 135–145)
Total Bilirubin: 0.5 mg/dL (ref 0.2–1.2)
Total Protein: 7.5 g/dL (ref 6.0–8.3)

## 2023-10-29 LAB — VITAMIN B12: Vitamin B-12: 234 pg/mL (ref 211–911)

## 2023-10-29 LAB — MICROALBUMIN / CREATININE URINE RATIO
Creatinine,U: 22.5 mg/dL
Microalb Creat Ratio: 3.1 mg/g (ref 0.0–30.0)
Microalb, Ur: <0.7 mg/dL (ref 0.0–1.9)

## 2023-10-29 LAB — MAGNESIUM: Magnesium: 2 mg/dL (ref 1.5–2.5)

## 2023-10-29 LAB — FOLATE: Folate: >24.2 ng/mL (ref >5.9)

## 2023-10-29 MED ORDER — BUPROPION HCL ER (SR) 100 MG PO TB12
100.0000 mg | ORAL_TABLET | Freq: Every day | ORAL | 3 refills | Status: DC
  Filled 2023-10-29: qty 100, 100d supply, fill #0
  Filled 2024-02-01: qty 100, 100d supply, fill #1
  Filled 2024-05-13: qty 100, 100d supply, fill #2
  Filled 2024-09-12: qty 100, 100d supply, fill #3

## 2023-10-30 ENCOUNTER — Other Ambulatory Visit (HOSPITAL_BASED_OUTPATIENT_CLINIC_OR_DEPARTMENT_OTHER): Payer: Self-pay

## 2023-11-02 ENCOUNTER — Other Ambulatory Visit (HOSPITAL_BASED_OUTPATIENT_CLINIC_OR_DEPARTMENT_OTHER): Payer: Self-pay

## 2023-11-02 ENCOUNTER — Telehealth: Payer: Self-pay

## 2023-11-02 MED ORDER — GLIPIZIDE 5 MG PO TABS
5.0000 mg | ORAL_TABLET | Freq: Every day | ORAL | 3 refills | Status: DC
  Filled 2023-11-02: qty 90, 90d supply, fill #0
  Filled 2024-02-15: qty 90, 90d supply, fill #1
  Filled 2024-05-13: qty 90, 90d supply, fill #2
  Filled 2024-08-15: qty 90, 90d supply, fill #3

## 2023-11-02 NOTE — Progress Notes (Signed)
   11/02/2023  Patient ID: Tara Guerrero, female   DOB: 01/09/1946, 77 y.o.   MRN: [id number]  Contacted patient at request of PCP to discuss Synjardy patient assistance. Reviewed program requirements with patient and she should qualify.   Printed [employer] application and left in front office for pickup, at patient request.  Sherrill Raring, PharmD Clinical Pharmacist [phone]

## 2023-11-04 ENCOUNTER — Ambulatory Visit (INDEPENDENT_AMBULATORY_CARE_PROVIDER_SITE_OTHER): Payer: PPO | Admitting: *Deleted

## 2023-11-04 DIAGNOSIS — E538 Deficiency of other specified B group vitamins: Secondary | ICD-10-CM | POA: Diagnosis not present

## 2023-11-04 MED ORDER — CYANOCOBALAMIN 1000 MCG/ML IJ SOLN
1000.0000 ug | Freq: Once | INTRAMUSCULAR | Status: AC
Start: 2023-11-04 — End: 2023-11-04
  Administered 2023-11-04: 1000 ug via INTRAMUSCULAR

## 2023-11-04 NOTE — Progress Notes (Signed)
Per orders of Dr. Banks, injection of Cyanocobalamin 1000mcg given by Funderburk, Jo A. °Patient tolerated injection well. ° °

## 2023-11-09 ENCOUNTER — Telehealth: Payer: Self-pay | Admitting: Family Medicine

## 2023-11-09 ENCOUNTER — Other Ambulatory Visit (HOSPITAL_BASED_OUTPATIENT_CLINIC_OR_DEPARTMENT_OTHER): Payer: Self-pay

## 2023-11-09 NOTE — Telephone Encounter (Signed)
Pt was last seen on 10/29/23. Pt called to ask if MD would be sending the Rx for the  hydrochlorothiazide HYDRODIURIL) 25 MG tablet  Pt called the pharmacy and was told they do no have it.  [facility] Phone: [phone]  Fax: [phone]

## 2023-11-10 DIAGNOSIS — Z85828 Personal history of other malignant neoplasm of skin: Secondary | ICD-10-CM | POA: Diagnosis not present

## 2023-11-10 DIAGNOSIS — Z85820 Personal history of malignant melanoma of skin: Secondary | ICD-10-CM | POA: Diagnosis not present

## 2023-11-10 DIAGNOSIS — L821 Other seborrheic keratosis: Secondary | ICD-10-CM | POA: Diagnosis not present

## 2023-11-10 DIAGNOSIS — M7130 Other bursal cyst, unspecified site: Secondary | ICD-10-CM | POA: Diagnosis not present

## 2023-11-10 DIAGNOSIS — L820 Inflamed seborrheic keratosis: Secondary | ICD-10-CM | POA: Diagnosis not present

## 2023-11-10 DIAGNOSIS — D1801 Hemangioma of skin and subcutaneous tissue: Secondary | ICD-10-CM | POA: Diagnosis not present

## 2023-11-10 DIAGNOSIS — L570 Actinic keratosis: Secondary | ICD-10-CM | POA: Diagnosis not present

## 2023-11-10 DIAGNOSIS — D2261 Melanocytic nevi of right upper limb, including shoulder: Secondary | ICD-10-CM | POA: Diagnosis not present

## 2023-11-10 DIAGNOSIS — D2372 Other benign neoplasm of skin of left lower limb, including hip: Secondary | ICD-10-CM | POA: Diagnosis not present

## 2023-11-11 ENCOUNTER — Telehealth: Payer: Self-pay | Admitting: Family Medicine

## 2023-11-11 NOTE — Telephone Encounter (Signed)
Pt called to say MD gave her some samples of Synjardy Empagliflozin-metFORMIN HCl (SYNJARDY) 12.04-999 MG TABS  But she has questions.  Pt asked that CMA call back at her earliest convenience.

## 2023-11-12 NOTE — Telephone Encounter (Signed)
I'm not seeing that it has been refilled recently.

## 2023-11-12 NOTE — Telephone Encounter (Signed)
If the samples were extended release, pt would need to take one tab per day.

## 2023-11-13 ENCOUNTER — Other Ambulatory Visit (HOSPITAL_BASED_OUTPATIENT_CLINIC_OR_DEPARTMENT_OTHER): Payer: Self-pay

## 2023-11-13 ENCOUNTER — Other Ambulatory Visit: Payer: Self-pay | Admitting: Family Medicine

## 2023-11-13 MED ORDER — HYDROCHLOROTHIAZIDE 25 MG PO TABS
25.0000 mg | ORAL_TABLET | Freq: Every day | ORAL | 2 refills | Status: DC
  Filled 2023-11-13: qty 90, 90d supply, fill #0
  Filled 2024-02-01: qty 90, 90d supply, fill #1
  Filled 2024-04-13: qty 90, 90d supply, fill #2

## 2023-11-13 NOTE — Telephone Encounter (Signed)
Called and spoke with patient she is going to take the one pill a day

## 2023-11-16 ENCOUNTER — Other Ambulatory Visit (HOSPITAL_COMMUNITY): Payer: Self-pay

## 2023-11-16 ENCOUNTER — Other Ambulatory Visit (HOSPITAL_BASED_OUTPATIENT_CLINIC_OR_DEPARTMENT_OTHER): Payer: Self-pay

## 2023-11-16 MED ORDER — SYNJARDY 12.5-1000 MG PO TABS
1.0000 | ORAL_TABLET | Freq: Two times a day (BID) | ORAL | 2 refills | Status: DC
  Filled 2023-11-16: qty 60, 30d supply, fill #0

## 2023-11-17 ENCOUNTER — Other Ambulatory Visit (HOSPITAL_BASED_OUTPATIENT_CLINIC_OR_DEPARTMENT_OTHER): Payer: Self-pay

## 2023-12-07 ENCOUNTER — Ambulatory Visit (INDEPENDENT_AMBULATORY_CARE_PROVIDER_SITE_OTHER): Payer: PPO

## 2023-12-07 ENCOUNTER — Telehealth: Payer: Self-pay

## 2023-12-07 DIAGNOSIS — E538 Deficiency of other specified B group vitamins: Secondary | ICD-10-CM | POA: Diagnosis not present

## 2023-12-07 MED ORDER — CYANOCOBALAMIN 1000 MCG/ML IJ SOLN
1000.0000 ug | Freq: Once | INTRAMUSCULAR | Status: AC
Start: 2023-12-07 — End: 2023-12-07
  Administered 2023-12-07: 1000 ug via INTRAMUSCULAR

## 2023-12-07 NOTE — Progress Notes (Signed)
 Per orders of Dr. Salomon Fick, injection of Cyanocobalamin given by Stann Ore. Patient tolerated injection well.

## 2023-12-07 NOTE — Progress Notes (Signed)
   12/07/2023  Patient ID: Tara Guerrero, female   DOB: [birth year], 77 y.o.   MRN: [id number]  Contacted patient via telephone to provide update on the [employer] PAP for Synjardy.  Patient has been approved to receive the medication from the company at no charge through 12/28/24.  Went over refill process with patient and she expressed understanding. Instructed to call if she has not received the first shipment in 2-3 weeks.  Sherrill Raring, PharmD Clinical Pharmacist [phone]

## 2024-01-06 ENCOUNTER — Other Ambulatory Visit (HOSPITAL_BASED_OUTPATIENT_CLINIC_OR_DEPARTMENT_OTHER): Payer: Self-pay

## 2024-01-07 ENCOUNTER — Ambulatory Visit (INDEPENDENT_AMBULATORY_CARE_PROVIDER_SITE_OTHER): Payer: PPO

## 2024-01-07 DIAGNOSIS — E538 Deficiency of other specified B group vitamins: Secondary | ICD-10-CM

## 2024-01-07 MED ORDER — CYANOCOBALAMIN 1000 MCG/ML IJ SOLN
1000.0000 ug | Freq: Once | INTRAMUSCULAR | Status: AC
Start: 2024-01-07 — End: 2024-01-07
  Administered 2024-01-07: 1000 ug via INTRAMUSCULAR

## 2024-01-07 NOTE — Progress Notes (Signed)
 Per orders of Dr. Salomon Fick, injection of Cyanocobalamin given by Stann Ore. Patient tolerated injection well.

## 2024-01-13 DIAGNOSIS — Z13228 Encounter for screening for other metabolic disorders: Secondary | ICD-10-CM | POA: Diagnosis not present

## 2024-01-26 ENCOUNTER — Ambulatory Visit (INDEPENDENT_AMBULATORY_CARE_PROVIDER_SITE_OTHER): Payer: PPO | Admitting: Podiatry

## 2024-01-26 ENCOUNTER — Encounter: Payer: Self-pay | Admitting: Podiatry

## 2024-01-26 DIAGNOSIS — E119 Type 2 diabetes mellitus without complications: Secondary | ICD-10-CM | POA: Diagnosis not present

## 2024-01-26 DIAGNOSIS — M79675 Pain in left toe(s): Secondary | ICD-10-CM | POA: Diagnosis not present

## 2024-01-26 DIAGNOSIS — M216X1 Other acquired deformities of right foot: Secondary | ICD-10-CM

## 2024-01-26 DIAGNOSIS — B351 Tinea unguium: Secondary | ICD-10-CM | POA: Diagnosis not present

## 2024-01-26 DIAGNOSIS — M21619 Bunion of unspecified foot: Secondary | ICD-10-CM

## 2024-01-26 DIAGNOSIS — M79674 Pain in right toe(s): Secondary | ICD-10-CM | POA: Diagnosis not present

## 2024-01-26 DIAGNOSIS — M2041 Other hammer toe(s) (acquired), right foot: Secondary | ICD-10-CM

## 2024-01-26 DIAGNOSIS — L84 Corns and callosities: Secondary | ICD-10-CM | POA: Diagnosis not present

## 2024-01-26 DIAGNOSIS — E1149 Type 2 diabetes mellitus with other diabetic neurological complication: Secondary | ICD-10-CM | POA: Diagnosis not present

## 2024-01-26 NOTE — Progress Notes (Signed)
Subjective: Chief Complaint  Patient presents with   [facility]    [facility] Patient has no other concerns.   78 year old female presents the office today for concerns.  She is not reporting new ulcerations symptoms.  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  Once involvement. Hyperkeratotic lesion noted on the right plantar forefoot without any underlying ulceration drainage or signs of infection. There is a submetatarsal 1. Nails are hypertrophic, dystrophic, brittle, discolored, elongated 10. No surrounding redness or drainage. Tenderness nails 1-5 bilaterally. No open lesions or pre-ulcerative lesions are identified today. Bunion and hammertoes present.  No pain with calf compression, swelling, warmth, erythema  Assessment: Hyperkeratotic lesions, symptomatic onychosis  Plan: -All treatment options discussed with the patient including all alternatives, risks, complications.  -Sharply debrided nails x 10 without any complications or bleeding. -Sharply debrided the hyperkeratotic lesion x 1 without any complications or bleeding.  Discussed moisturizer, offloading. -I do think she will benefit from diabetic shoes.  She was measured for shoes today. -Patient encouraged to call the office with any questions, concerns, change in symptoms.   Return in about 1 year (around 01/25/2025).  Vivi Barrack DPM

## 2024-01-26 NOTE — Patient Instructions (Signed)

## 2024-01-29 ENCOUNTER — Ambulatory Visit (INDEPENDENT_AMBULATORY_CARE_PROVIDER_SITE_OTHER): Payer: PPO | Admitting: Family Medicine

## 2024-01-29 ENCOUNTER — Encounter: Payer: Self-pay | Admitting: Family Medicine

## 2024-01-29 VITALS — BP 120/78 | HR 76 | Temp 97.9°F | Ht 65.0 in | Wt 187.8 lb

## 2024-01-29 DIAGNOSIS — E119 Type 2 diabetes mellitus without complications: Secondary | ICD-10-CM

## 2024-01-29 DIAGNOSIS — E538 Deficiency of other specified B group vitamins: Secondary | ICD-10-CM | POA: Diagnosis not present

## 2024-01-29 DIAGNOSIS — R202 Paresthesia of skin: Secondary | ICD-10-CM | POA: Diagnosis not present

## 2024-01-29 LAB — POCT GLYCOSYLATED HEMOGLOBIN (HGB A1C): Hemoglobin A1C: 7.7 % — AB (ref 4.0–5.6)

## 2024-01-29 NOTE — Patient Instructions (Addendum)
Your hemoglobin A1c was 7.7% this visit.  It was 7.6% on 07/19/2023.   When you schedule your upcoming mammogram let them know that you want a digital mammogram due to dense breast tissue.  If needed we can place a referral for this.

## 2024-01-29 NOTE — Progress Notes (Unsigned)
Established Patient Office Visit   Subjective  Patient ID: Tara Guerrero, female    DOB: Jan 06, 1946  Age: 78 y.o. MRN: [id number]  Chief Complaint  Patient presents with  . Medical Management of Chronic Issues    3 month follow-up, DM     HPI  Patient Active Problem List   Diagnosis Date Noted  . Abrasion of ear canal, right, initial encounter 10/09/2023  . Syncope and collapse 07/19/2023  . Leukocytosis 07/19/2023  . Pneumonia due to COVID-19 virus 07/19/2023  . Uncontrolled type 2 diabetes mellitus with hyperglycemia, without long-term current use of insulin (HCC) 07/19/2023  . Renal insufficiency 07/19/2023  . Depression 07/19/2023  . Palpitations 02/23/2023  . Chest pain of uncertain etiology 02/23/2023  . Essential hypertension 06/08/2019  . Hyperlipidemia associated with type 2 diabetes mellitus (HCC) 06/08/2019   Past Medical History:  Diagnosis Date  . 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  . Depression   . Diabetes mellitus without complication (HCC)   . Diverticulosis   . Hyperlipidemia   . Hyperplastic colon polyp 11/2014  . Hypertension   . Sleep apnea    Past Surgical History:  Procedure Laterality Date  . ABDOMINAL HYSTERECTOMY  1976   s/p uterine CA  . BREAST EXCISIONAL BIOPSY Bilateral   . BREAST SURGERY  1995   L breast node removal  . BREAST SURGERY     R breast biopsy  . CHOLECYSTECTOMY N/A 10/17/2021   Procedure: LAPAROSCOPIC CHOLECYSTECTOMY;  Surgeon: Harriette Bouillon, MD;  Location: [facility] OR;  Service: General;  Laterality: N/A;  . EYE MUSCLE SURGERY  2019  . EYE SURGERY  2017   bilateral cataract surgery  . GALLBLADDER SURGERY    . JOINT REPLACEMENT  2004   bilateral knee replacements  . TONSILLECTOMY  1950   Social History   Tobacco Use  . Smoking status: Former  . Smokeless tobacco: Never  Vaping Use  . Vaping status: Never Used  Substance Use Topics  . Alcohol use: Yes    Alcohol/week: 1.0 standard drink of alcohol    Types: 1 Glasses  of wine per week    Comment: Social  . Drug use: Never   Family History  Problem Relation Age of Onset  . Cancer Mother   . Hyperlipidemia Mother   . Hypertension Mother   . Stroke Mother   . Breast cancer Mother        diagnosed in her 32's  . Alcohol abuse Father   . Cancer Sister   . Hyperlipidemia Sister   . Hypertension Sister   . Stroke Sister   . Breast cancer Sister        diagnosed in her 48's  . Mental illness Maternal Grandmother   . Hypertension Maternal Grandmother   . Hyperlipidemia Maternal Grandmother   . Mental retardation Maternal Grandmother   . Hearing loss Maternal Grandfather   . Stroke Maternal Grandfather   . Alcohol abuse Paternal Grandmother   . Alcohol abuse Paternal Grandfather   . Breast cancer Cousin        diagnosed in her 76's  . Breast cancer Sister 70  . Breast cancer Other        diagnosed late 20's   Allergies  Allergen Reactions  . Codeine Other (See Comments)    Agitation  Other Reaction: Other reaction    Agitation      ROS Negative unless stated above    Objective:     BP 120/78 (BP  Location: Left Arm, Patient Position: Sitting, Cuff Size: Normal)   Pulse 76   Temp 97.9 F (36.6 C) (Oral)   Ht 5\' 5"  (1.651 m)   Wt 187 lb 12.8 oz (85.2 kg)   SpO2 96%   BMI 31.25 kg/m  {Vitals History (Optional):23777}  Physical Exam Constitutional:      General: She is not in acute distress.    Appearance: Normal appearance.  HENT:     Head: Normocephalic and atraumatic.     Nose: Nose normal.     Mouth/Throat:     Mouth: Mucous membranes are moist.  Cardiovascular:     Rate and Rhythm: Normal rate and regular rhythm.     Heart sounds: Normal heart sounds. No murmur heard.    No gallop.  Pulmonary:     Effort: Pulmonary effort is normal. No respiratory distress.     Breath sounds: Normal breath sounds. No wheezing, rhonchi or rales.  Skin:    General: Skin is warm and dry.  Neurological:     Mental Status: She is  alert and oriented to person, place, and time.      Results for orders placed or performed in visit on 01/29/24  POC HgB A1c  Result Value Ref Range   Hemoglobin A1C 7.7 (A) 4.0 - 5.6 %   HbA1c POC (<> result, manual entry)     HbA1c, POC (prediabetic range)     HbA1c, POC (controlled diabetic range)        Assessment & Plan:  Controlled type 2 diabetes mellitus without complication, without long-term current use of insulin (HCC) -     POCT glycosylated hemoglobin (Hb A1C)  Paresthesia  Vitamin B 12 deficiency    Return in about 4 months (around 05/28/2024).   Deeann Saint, MD

## 2024-02-02 DIAGNOSIS — R202 Paresthesia of skin: Secondary | ICD-10-CM | POA: Insufficient documentation

## 2024-02-02 DIAGNOSIS — E119 Type 2 diabetes mellitus without complications: Secondary | ICD-10-CM | POA: Insufficient documentation

## 2024-02-09 ENCOUNTER — Other Ambulatory Visit (HOSPITAL_BASED_OUTPATIENT_CLINIC_OR_DEPARTMENT_OTHER): Payer: Self-pay

## 2024-02-15 ENCOUNTER — Other Ambulatory Visit (HOSPITAL_BASED_OUTPATIENT_CLINIC_OR_DEPARTMENT_OTHER): Payer: Self-pay

## 2024-02-23 ENCOUNTER — Other Ambulatory Visit (HOSPITAL_BASED_OUTPATIENT_CLINIC_OR_DEPARTMENT_OTHER): Payer: Self-pay

## 2024-02-23 MED ORDER — CHLORHEXIDINE GLUCONATE 0.12 % MT SOLN
15.0000 mL | Freq: Two times a day (BID) | OROMUCOSAL | 0 refills | Status: AC
Start: 2024-02-23 — End: 2024-03-10
  Filled 2024-02-23: qty 473, 16d supply, fill #0

## 2024-02-29 ENCOUNTER — Ambulatory Visit: Payer: PPO | Attending: Cardiology | Admitting: Cardiology

## 2024-02-29 ENCOUNTER — Encounter: Payer: Self-pay | Admitting: Cardiology

## 2024-02-29 VITALS — BP 128/62 | HR 73 | Ht 65.0 in | Wt 182.6 lb

## 2024-02-29 DIAGNOSIS — G63 Polyneuropathy in diseases classified elsewhere: Secondary | ICD-10-CM | POA: Diagnosis not present

## 2024-02-29 DIAGNOSIS — E1169 Type 2 diabetes mellitus with other specified complication: Secondary | ICD-10-CM

## 2024-02-29 DIAGNOSIS — I10 Essential (primary) hypertension: Secondary | ICD-10-CM

## 2024-02-29 DIAGNOSIS — I493 Ventricular premature depolarization: Secondary | ICD-10-CM | POA: Diagnosis not present

## 2024-02-29 DIAGNOSIS — E785 Hyperlipidemia, unspecified: Secondary | ICD-10-CM | POA: Diagnosis not present

## 2024-02-29 NOTE — Patient Instructions (Signed)
 Medication Instructions:    No changes    Lab Work: Not needed    Testing/Procedures: Not needed   Follow-Up: At [facility], you and your health needs are our priority.  As part of our continuing mission to provide you with exceptional heart care, we have created designated Provider Care Teams.  These Care Teams include your primary Cardiologist (physician) and Advanced Practice Providers (APPs -  Physician Assistants and Nurse Practitioners) who all work together to provide you with the care you need, when you need it.     Your next appointment:   As needed   The format for your next appointment:   In Person  Provider:   Bryan Lemma, MD   Other Instructions

## 2024-02-29 NOTE — Progress Notes (Unsigned)
 Cardiology Office Note:  .   Date:  03/02/2024  ID:  Tara Guerrero, DOB [birth year], MRN [id number] PCP: Deeann Saint, MD  [facility] Providers Cardiologist:  Bryan Lemma, MD {   Chief Complaint  Patient presents with   Follow-up    43-month.  Doing well   Palpitations    Overall stable   Patient Profile: .     Tara Guerrero is a 78 y.o. female with a PMH notable for HTN, HLD, DM-2 as well as palpitations who presents here for 78-month follow-up at the request of Deeann Saint, MD.    Tara Guerrero was last seen in May 2024 after a prolonged monitoring phase of a total of 3 monitors showing mostly sinus rhythm with isolated PACs and PVCs with some ventricular bigeminy/trigeminy as well as 7 brief atrial runs.  At that time she was feeling quite well with no more chest pain or palpitations.  Her stress is been somewhat relieved.  She had to euthanize her dog she had had for many years.  As a result she was no longer active walking the dog daily.  She indicated that she did not feel the atrial runs noted on monitor.  Negative CV ROS.  LDL was 81 on 20 mg atorvastatin and A1c was 5.1 on Synjardy and glipizide.  Discussed maintaining adequate hydration and avoiding stressors/caffeine.  No further chest pain.  Admitted to the [facility] 07/18/2023-in for COVID-19-pneumonia complicated by Syncope and Collapse felt related to orthostatic hypotension with some mild bradycardia.  Treated with IV fluids.  Noted episodic bradycardia on monitor that was asymptomatic.  Discharged on amoxicillin plus azithromycin.  She was recently seen by her PCP on January 29, 2024 for routine follow-up.  Doing well.  Still having some hand paresthesias following COVID.  Discussed follow-up on mammogram showing dense breast tissue.  A1c was 7.7 was felt that possibly some of the paresthesias are related to diabetes.  Subjective  Discussed the use of AI scribe software for clinical note  transcription with the patient, who gave verbal consent to proceed.  History of Present Illness   Tara Guerrero is a 78 year old female with Hypertension, hyperlipidemia and DM 2 as well as intermittent palpitations and diabetes who presents for follow-up of chest pain and palpitations.  She no longer experiences palpitations or fast heart rates. A prior monitor showed premature ventricular contractions (PVCs), but she does not feel them. Her sister has similar symptoms. No current chest pain, pressure, or tightness, and no shortness of breath during daily activities or while lying flat.  She has a history of diabetes, with her most recent hemoglobin A1c at 7.7, an increase from the previous year when it was in the 6 range. She is on glipizide 5 mg and Synjardy 12.04/999 mg. Dietary habits, particularly around the holiday season, may have contributed to this increase.  She experiences numbness in her fingers and toes, thought to be related to COVID-19 or possibly diabetes. She is not on any medication like Lyrica or gabapentin for this issue.  Her medication regimen includes atorvastatin 20 mg for cholesterol management, with her last LDL at 81. She is also on ramipril 10 mg, HCTZ 25 mg, and verapamil for blood pressure and heart rate control. No dizziness, wooziness, or passing out since an episode related to COVID-19 in July. No swelling in her legs, stroke-like symptoms, or pain in her calves or thighs when walking.  She consumes about two  cups of coffee daily. Her daily medications also include Zyrtec for allergies, Wellbutrin 100 mg, and aspirin 81 mg for cardiovascular prevention.       Objective  . Medications - Glipizide 5 mg; - Synjardy 12.04/999 - Atorvastatin 20 mg - Ramipril 10 mg; - HCTZ 25 mg; - Verapamil CR 180 mg - Zyrtec daily for allergies - Wellbutrin 100 mg - Aspirin 81 mg   Studies Reviewed: Marland Kitchen   EKG Interpretation Date/Time:  Monday February 29 2024 09:11:02  EST Ventricular Rate:  73 PR Interval:  174 QRS Duration:  80 QT Interval:  380 QTC Calculation: 418 R Axis:   64  Text Interpretation: Sinus rhythm with sinus arrhythmia with occasional Premature ventricular complexes When compared with ECG of 19-Jul-2023 06:57, Premature ventricular complexes in a pattern of bigeminy NO LONGER PRESENT Premature ventricular complexes present Confirmed by Bryan Lemma ([id number]) on 02/29/2024 9:16:32 AM    Lab Results  Component Value Date   CHOL 182 02/09/2023   HDL 68.00 02/09/2023   LDLCALC 81 02/09/2023   TRIG 167.0 (H) 02/09/2023   CHOLHDL 3 02/09/2023   Lab Results  Component Value Date   HGBA1C 7.7 (A) 01/29/2024    Lab Results  Component Value Date   NA 139 10/29/2023   K 4.2 10/29/2023   CREATININE 0.87 10/29/2023   GFR 63.55 10/29/2023   GLUCOSE 141 (H) 10/29/2023   Risk Assessment/Calculations:         Physical Exam:   VS:  BP 128/62 (BP Location: Left Arm, Patient Position: Sitting, Cuff Size: Normal)   Pulse 73   Ht 5\' 5"  (1.651 m)   Wt 182 lb 9.6 oz (82.8 kg)   SpO2 97%   BMI 30.39 kg/m    Wt Readings from Last 3 Encounters:  02/29/24 182 lb 9.6 oz (82.8 kg)  01/29/24 187 lb 12.8 oz (85.2 kg)  10/29/23 193 lb 9.6 oz (87.8 kg)    GEN: Well nourished, well groomed in no acute distress; mildly but otherwise healthy-appearing. NECK: No JVD; No carotid bruits CARDIAC: Normal S1, S2; RRR, no murmurs, rubs, gallops RESPIRATORY:  Clear to auscultation without rales, wheezing or rhonchi ; nonlabored, good air movement. ABDOMEN: Soft, non-tender, non-distended EXTREMITIES:  No edema; No deformity      ASSESSMENT AND PLAN: .    Problem List Items Addressed This Visit       Cardiology Problems   Essential hypertension - Primary (Chronic)   Blood pressure controlled with ramipril, HCTZ, and verapamil. Advised to hold HCTZ if dehydrated or ill. - Continue ramipril 10 mg. - Continue HCTZ 25 mg. - Continue verapamil. -  Advise to hold HCTZ if experiencing dehydration or illness.      Relevant Orders   EKG 12-Lead (Completed)   Hyperlipidemia associated with type 2 diabetes mellitus (HCC) (Chronic)   LDL cholesterol at 81 last year, acceptable. On atorvastatin due to diabetes. - Continue atorvastatin 20 mg. - Monitor LDL cholesterol during next primary care visit.  Hemoglobin A1c at 7.7, indicating suboptimal control. Managed with glipizide and Synjardy. Emphasized diet and lifestyle changes. - Continue glipizide 5 mg. - Continue Synjardy 12.04/999 mg. - Discuss diet and lifestyle modifications with primary care provider.      Symptomatic PVCs (Chronic)   No longer symptomatic.  Usually well-controlled with verapamil. - Continue verapamil CR 180 mg        Other   Peripheral neuropathy (Chronic)   Numbness in fingers and toes, possibly related to diabetes or  post-COVID-19. - Discuss potential treatment options with primary care provider if symptoms worsen.         Goals of Care Well-managed from a cardiac perspective. Transition follow-up to primary care unless new cardiac symptoms develop. - Follow up with cardiologist if new cardiac symptoms develop. - Continue regular follow-up with primary care provider.  Follow-up Scheduled follow-up with Dr. Salomon Fick for diabetes and cholesterol management. - Follow up with Dr. Salomon Fick on March 12.      Follow-Up: Return if symptoms worsen or fail to improve, for Followup when necessary.     Signed, Marykay Lex, MD, MS Bryan Lemma, M.D., M.S. Interventional Cardiologist  [facility]  Pager # [phone] Phone # [phone] [address] [place], [place] [address]

## 2024-03-02 ENCOUNTER — Encounter: Payer: Self-pay | Admitting: Cardiology

## 2024-03-02 DIAGNOSIS — I493 Ventricular premature depolarization: Secondary | ICD-10-CM | POA: Insufficient documentation

## 2024-03-02 DIAGNOSIS — G629 Polyneuropathy, unspecified: Secondary | ICD-10-CM | POA: Insufficient documentation

## 2024-03-02 NOTE — Assessment & Plan Note (Signed)
 LDL cholesterol at 81 last year, acceptable. On atorvastatin due to diabetes. - Continue atorvastatin 20 mg. - Monitor LDL cholesterol during next primary care visit.  Hemoglobin A1c at 7.7, indicating suboptimal control. Managed with glipizide and Synjardy. Emphasized diet and lifestyle changes. - Continue glipizide 5 mg. - Continue Synjardy 12.04/999 mg. - Discuss diet and lifestyle modifications with primary care provider.

## 2024-03-02 NOTE — Assessment & Plan Note (Signed)
 No longer symptomatic.  Usually well-controlled with verapamil. - Continue verapamil CR 180 mg

## 2024-03-02 NOTE — Assessment & Plan Note (Signed)
 Numbness in fingers and toes, possibly related to diabetes or post-COVID-19. - Discuss potential treatment options with primary care provider if symptoms worsen.

## 2024-03-02 NOTE — Assessment & Plan Note (Signed)
 Blood pressure controlled with ramipril, HCTZ, and verapamil. Advised to hold HCTZ if dehydrated or ill. - Continue ramipril 10 mg. - Continue HCTZ 25 mg. - Continue verapamil. - Advise to hold HCTZ if experiencing dehydration or illness.

## 2024-03-09 ENCOUNTER — Ambulatory Visit (INDEPENDENT_AMBULATORY_CARE_PROVIDER_SITE_OTHER): Payer: PPO

## 2024-03-09 VITALS — Ht 65.0 in | Wt 182.0 lb

## 2024-03-09 DIAGNOSIS — Z0000 Encounter for general adult medical examination without abnormal findings: Secondary | ICD-10-CM | POA: Diagnosis not present

## 2024-03-09 NOTE — Progress Notes (Signed)
 Subjective:   Tara Guerrero is a 78 y.o. female who presents for Medicare Annual (Subsequent) preventive examination.  Visit Complete: Virtual I connected with  Tara Guerrero on 03/09/24 by a audio enabled telemedicine application and verified that I am speaking with the correct person using two identifiers.  Patient Location: Home  Provider Location: Home Office  I discussed the limitations of evaluation and management by telemedicine. The patient expressed understanding and agreed to proceed.  Vital Signs: Because this visit was a virtual/telehealth visit, some criteria may be missing or patient reported. Any vitals not documented were not able to be obtained and vitals that have been documented are patient reported.  Patient Medicare AWV questionnaire was completed by the patient on 03/07/24; I have confirmed that all information answered by patient is correct and no changes since this date.  Cardiac Risk Factors include: advanced age (>19men, >42 women);diabetes mellitus;hypertension     Objective:    Today's Vitals   03/09/24 1008 03/09/24 1011  Weight: 182 lb (82.6 kg)   Height: 5\' 5"  (1.651 m)   PainSc:  0-No pain   Body mass index is 30.29 kg/m.     03/09/2024   10:18 AM 07/19/2023    5:59 AM 07/18/2023    9:23 AM 02/25/2023    1:57 PM 02/06/2023   11:51 AM 02/05/2022   11:34 AM 10/17/2021   12:44 PM  Advanced Directives  Does Patient Have a Medical Advance Directive? Yes Yes No Yes Yes Yes Yes  Type of Estate agent of [place];Living will Living will  Healthcare Power of [place];Living will Healthcare Power of [place];Living will Healthcare Power of [place];Living will Healthcare Power of [place];Living will  Does patient want to make changes to medical advance directive? No - Patient declined Yes (ED - send information to MyChart)  No - Patient declined No - Patient declined No - Patient declined No - Patient declined  Copy of Healthcare Power  of Attorney in Chart? Yes - validated most recent copy scanned in chart (See row information)   Yes - validated most recent copy scanned in chart (See row information) Yes - validated most recent copy scanned in chart (See row information) Yes - validated most recent copy scanned in chart (See row information) Yes - validated most recent copy scanned in chart (See row information)  Would patient like information on creating a medical advance directive?   No - Patient declined        Current Medications (verified) Outpatient Encounter Medications as of 03/09/2024  Medication Sig   acetaminophen (TYLENOL) 500 MG tablet Take 500 mg by mouth every 6 (six) hours as needed for moderate pain.   APPLE CIDER VINEGAR PO Take by mouth.   Ascorbic Acid (VITAMIN C) 1000 MG tablet Take 1,000 mg by mouth 2 (two) times a week.   aspirin EC 81 MG tablet Take 81 mg by mouth daily.   atorvastatin (LIPITOR) 20 MG tablet Take 1 tablet (20 mg total) by mouth daily.   Blood Glucose Monitoring Suppl (ONE TOUCH ULTRA 2) w/Device KIT Use to Check Blood glucose daily.( Dx:Z51.81)   Blood Glucose Monitoring Suppl DEVI 1 each by Does not apply route in the morning, at noon, and at bedtime. May substitute to any manufacturer covered by patient's insurance.   buPROPion ER (WELLBUTRIN SR) 100 MG 12 hr tablet Take 1 tablet (100 mg total) by mouth daily.   Calcium Carb-Cholecalciferol (CALCIUM 600 + D PO) Take 1 tablet  by mouth 2 (two) times a week.   cetirizine (ZYRTEC) 10 MG tablet Take 10 mg by mouth daily.   chlorhexidine (PERIDEX) 0.12 % solution Swish and spit 15 mLs by mouth for 30 seconds 2 (two) times daily for 14 days.   cycloSPORINE (RESTASIS) 0.05 % ophthalmic emulsion Place 1 drop into both eyes every 12 (twelve) hours.   Empagliflozin-metFORMIN HCl (SYNJARDY) 12.04-999 MG TABS Take 1 tablet by mouth 2 (two) times daily.   glipiZIDE (GLUCOTROL) 5 MG tablet Take 1 tablet by mouth daily.   hydrochlorothiazide  (HYDRODIURIL) 25 MG tablet Take 1 tablet (25 mg total) by mouth daily.   Lancets (ONETOUCH DELICA PLUS LANCET33G) MISC Use to check blood sugar up to 3 times daily.   Multiple Vitamin (MULTIVITAMIN WITH MINERALS) TABS tablet Take 1 tablet by mouth 2 (two) times a week.   ramipril (ALTACE) 10 MG capsule TAKE 1 CAPSULE BY MOUTH  DAILY   verapamil (CALAN-SR) 180 MG CR tablet Take 1 tablet by mouth at bedtime.   No facility-administered encounter medications on file as of 03/09/2024.    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   Depression    Diabetes mellitus without complication (HCC)    Diverticulosis    Hyperlipidemia    Hyperplastic colon polyp 11/2014   Hypertension    Sleep apnea    Past Surgical History:  Procedure Laterality Date   ABDOMINAL HYSTERECTOMY  1976   s/p uterine CA   BREAST EXCISIONAL BIOPSY Bilateral    BREAST SURGERY  1995   L breast node removal   BREAST SURGERY     R breast biopsy   CHOLECYSTECTOMY N/A 10/17/2021   Procedure: LAPAROSCOPIC CHOLECYSTECTOMY;  Surgeon: Harriette Bouillon, MD;  Location: [facility] OR;  Service: General;  Laterality: N/A;   EYE MUSCLE SURGERY  2019   EYE SURGERY  2017   bilateral cataract surgery   GALLBLADDER SURGERY     JOINT REPLACEMENT  2004   bilateral knee replacements   TONSILLECTOMY  1950   Family History  Problem Relation Age of Onset   Cancer Mother    Hyperlipidemia Mother    Hypertension Mother    Stroke Mother    Breast cancer Mother        diagnosed in her 29's   Alcohol abuse Father    Cancer Sister    Hyperlipidemia Sister    Hypertension Sister    Stroke Sister    Breast cancer Sister        diagnosed in her 67's   Mental illness Maternal Grandmother    Hypertension Maternal Grandmother    Hyperlipidemia Maternal Grandmother    Mental retardation Maternal Grandmother    Hearing loss Maternal Grandfather    Stroke Maternal Grandfather     Alcohol abuse Paternal Grandmother    Alcohol abuse Paternal Grandfather    Breast cancer Cousin        diagnosed in her 37's   Breast cancer Sister 56   Breast cancer Other        diagnosed late 20's   Social History   Socioeconomic History   Marital status: Single    Spouse name: Not on file   Number of children: 1   Years of education: 14   Highest education level: Some college, no degree  Occupational History   Occupation: retired  Tobacco Use   Smoking status: Former   Smokeless tobacco: Never  Vaping Use   Vaping status: Never Used  Substance and Sexual Activity   Alcohol use: Yes    Alcohol/week: 1.0 standard drink of alcohol    Types: 1 Glasses of wine per week    Comment: Social   Drug use: Never   Sexual activity: Not Currently  Other Topics Concern   Not on file  Social History Narrative   01/24/2019: Lives with granddaughter and dog.    Retired       [employer] her dog for 5 times a day.  Usually does about flat ground.  Does not take stairs because of knee pain from bilateral knee replacements.   Social Drivers of Corporate investment banker Strain: Low Risk  (03/09/2024)   Overall Financial Resource Strain (CARDIA)    Difficulty of Paying Living Expenses: Not hard at all  Food Insecurity: No Food Insecurity (03/09/2024)   Hunger Vital Sign    Worried About Running Out of Food in the Last Year: Never true    Ran Out of Food in the Last Year: Never true  Transportation Needs: No Transportation Needs (03/09/2024)   PRAPARE - Administrator, Civil Service (Medical): No    Lack of Transportation (Non-Medical): No  Physical Activity: Sufficiently Active (03/09/2024)   Exercise Vital Sign    Days of Exercise per Week: 7 days    Minutes of Exercise per Session: 80 min  Recent Concern: Physical Activity - Insufficiently Active (03/09/2024)   Exercise Vital Sign    Days of Exercise per Week: 7 days    Minutes of Exercise per Session: 20 min  Stress:  No Stress Concern Present (03/09/2024)   [employer] of Occupational Health - Occupational Stress Questionnaire    Feeling of Stress : Not at all  Social Connections: Moderately Integrated (03/09/2024)   Social Connection and Isolation Panel [NHANES]    Frequency of Communication with Friends and Family: More than three times a week    Frequency of Social Gatherings with Friends and Family: More than three times a week    Attends Religious Services: More than 4 times per year    Active Member of [employer] or Organizations: Yes    Attends Engineer, structural: More than 4 times per year    Marital Status: Divorced    Tobacco Counseling Counseling given: Not Answered   Clinical Intake:  Pre-visit preparation completed: Yes  Pain : No/denies pain Pain Score: 0-No pain     BMI - recorded: 30.29 Nutritional Status: BMI > 30  Obese Nutritional Risks: None Diabetes: Yes CBG done?: No Did pt. bring in CBG monitor from home?: No  How often do you need to have someone help you when you read instructions, pamphlets, or other written materials from your doctor or pharmacy?: 1 - Never  Interpreter Needed?: No  Information entered by :: Theresa Mulligan PN   Activities of Daily Living    03/09/2024   10:16 AM 03/07/2024   11:30 AM  In your present state of health, do you have any difficulty performing the following activities:  Hearing? 0 0  Vision? 0 0  Difficulty concentrating or making decisions? 0 0  Walking or climbing stairs? 0 0  Dressing or bathing? 0 0  Doing errands, shopping? 0 0  Preparing Food and eating ? N N  Using the Toilet? N   In the past six months, have you accidently leaked urine? Malvin Johns  Comment Wears Pads. Followed by PCP  Do you have problems with loss of bowel control? N N  Managing your Medications? N N  Managing your Finances? N N  Housekeeping or managing your Housekeeping? N N    Patient Care Team: Deeann Saint, MD as PCP - General  (Family Medicine) Marykay Lex, MD as PCP - Cardiology (Cardiology)  Indicate any recent Medical Services you may have received from other than [facility] providers in the past year (date may be approximate).     Assessment:   This is a routine wellness examination for English.  Hearing/Vision screen Hearing Screening - Comments:: Wears Hearing Aids Vision Screening - Comments:: Wears rx glasses - up to date with routine eye exams with  [facility]   Goals Addressed               This Visit's Progress     Lose weight (pt-stated)         Depression Screen    03/09/2024   10:15 AM 10/29/2023    9:37 AM 07/30/2023   10:29 AM 02/25/2023    1:54 PM 02/09/2023   10:37 AM 02/06/2023   11:47 AM 04/03/2022    9:01 AM  PHQ 2/9 Scores  PHQ - 2 Score 0 0 0 0 0 0 0  PHQ- 9 Score  1 2 0 1  0    Fall Risk    03/09/2024   10:17 AM 03/07/2024   11:30 AM 10/29/2023    9:37 AM 02/25/2023    1:56 PM 02/24/2023    2:06 PM  Fall Risk   Falls in the past year? 1 1 1  0 0  Number falls in past yr: 0 0 0 0 0  Injury with Fall? 0 0 1 0 0  Risk for fall due to : No Fall Risks   No Fall Risks   Follow up Falls prevention discussed;Falls evaluation completed  Falls evaluation completed Falls prevention discussed     MEDICARE RISK AT HOME: Medicare Risk at Home Any stairs in or around the home?: Yes If so, are there any without handrails?: Yes Home free of loose throw rugs in walkways, pet beds, electrical cords, etc?: No Adequate lighting in your home to reduce risk of falls?: Yes Life alert?: No Use of a cane, walker or w/c?: No Grab bars in the bathroom?: No Shower chair or bench in shower?: No Elevated toilet seat or a handicapped toilet?: No  TIMED UP AND GO:  Was the test performed?  No    Cognitive Function:        03/09/2024   10:18 AM 02/25/2023    1:57 PM 02/06/2023   11:51 AM 02/05/2022   11:34 AM 01/30/2021   11:08 AM  6CIT Screen  What Year? 0 points 0 points 0 points 0  points 0 points  What month? 0 points 0 points 0 points 0 points 0 points  What time? 0 points 0 points 0 points 0 points   Count back from 20 0 points 0 points 0 points 0 points 0 points  Months in reverse 0 points 0 points 0 points 0 points 0 points  Repeat phrase 0 points 0 points 0 points 0 points 0 points  Total Score 0 points 0 points 0 points 0 points     Immunizations Immunization History  Administered Date(s) Administered   Fluad Quad(high Dose 65+) 09/23/2019   Influenza, High Dose Seasonal PF 09/30/2022   Influenza-Unspecified 09/27/2015, 10/18/2020, 09/09/2023   Moderna Sars-Covid-2 Vaccination  01/29/2020, 02/26/2020, 10/26/2020   PFIZER Comirnaty(Gray Top)Covid-19 Tri-Sucrose Vaccine 04/01/2021, 09/16/2021   PFIZER(Purple Top)SARS-COV-2 Vaccination 08/16/2021, 09/30/2022   PNEUMOCOCCAL CONJUGATE-20 02/09/2023   Respiratory Syncytial Virus Vaccine,Recomb Aduvanted(Arexvy) 09/30/2022   Tdap 04/10/2016   Zoster Recombinant(Shingrix) 04/05/2017, 09/03/2017    TDAP status: Up to date  Flu Vaccine status: Up to date  Pneumococcal vaccine status: Completed during today's visit.  Covid-19 vaccine status: Declined, Education has been provided regarding the importance of this vaccine but patient still declined. Advised may receive this vaccine at local pharmacy or Health Dept.or vaccine clinic. Aware to provide a copy of the vaccination record if obtained from local pharmacy or Health Dept. Verbalized acceptance and understanding.  Qualifies for Shingles Vaccine? Yes   Zostavax completed Yes   Shingrix Completed?: Yes  Screening Tests Health Maintenance  Topic Date Due   COVID-19 Vaccine (8 - 2024-25 season) 08/30/2023   OPHTHALMOLOGY EXAM  01/27/2024   HEMOGLOBIN A1C  07/28/2024   Diabetic kidney evaluation - eGFR measurement  10/28/2024   Diabetic kidney evaluation - Urine ACR  10/28/2024   FOOT EXAM  01/28/2025   Medicare Annual Wellness (AWV)  03/09/2025    DTaP/Tdap/Td (2 - Td or Tdap) 04/10/2026   Pneumonia Vaccine 35+ Years old  Completed   INFLUENZA VACCINE  Completed   DEXA SCAN  Completed   Hepatitis C Screening  Completed   Zoster Vaccines- Shingrix  Completed   HPV VACCINES  Aged Out   Colonoscopy  Discontinued    Health Maintenance  Health Maintenance Due  Topic Date Due   COVID-19 Vaccine (8 - 2024-25 season) 08/30/2023   OPHTHALMOLOGY EXAM  01/27/2024        Bone Density status: Completed 08/11/19. Results reflect: Bone density results: NORMAL. Repeat every   years.      Additional Screening:  Hepatitis C Screening: does qualify; Completed 04/26/21  Vision Screening: Recommended annual ophthalmology exams for early detection of glaucoma and other disorders of the eye. Is the patient up to date with their annual eye exam?  Yes  Who is the provider or what is the name of the office in which the patient attends annual eye exams? [facility] If pt is not established with a provider, would they like to be referred to a provider to establish care? No .   Dental Screening: Recommended annual dental exams for proper oral hygiene  Diabetic Foot Exam: Diabetic Foot Exam: Completed 01/29/24  Community Resource Referral / Chronic Care Management:  CRR required this visit?  No   CCM required this visit?  No     Plan:     I have personally reviewed and noted the following in the patient's chart:   Medical and social history Use of alcohol, tobacco or illicit drugs  Current medications and supplements including opioid prescriptions. Patient is not currently taking opioid prescriptions. Functional ability and status Nutritional status Physical activity Advanced directives List of other physicians Hospitalizations, surgeries, and ER visits in previous 12 months Vitals Screenings to include cognitive, depression, and falls Referrals and appointments  In addition, I have reviewed and discussed with patient  certain preventive protocols, quality metrics, and best practice recommendations. A written personalized care plan for preventive services as well as general preventive health recommendations were provided to patient.     Tillie Rung, LPN   [id number]   After Visit Summary: (MyChart) Due to this being a telephonic visit, the after visit summary with patients personalized plan was offered to patient  via MyChart   Nurse Notes: None

## 2024-03-09 NOTE — Patient Instructions (Addendum)
 Tara Guerrero , Thank you for taking time to come for your Medicare Wellness Visit. I appreciate your ongoing commitment to your health goals. Please review the following plan we discussed and let me know if I can assist you in the future.   Referrals/Orders/Follow-Ups/Clinician Recommendations: Follow up with Eye Exam appointment with [facility]  This is a list of the screening recommended for you and due dates:  Health Maintenance  Topic Date Due   COVID-19 Vaccine (8 - 2024-25 season) 08/30/2023   Eye exam for diabetics  01/27/2024   Hemoglobin A1C  07/28/2024   Yearly kidney function blood test for diabetes  10/28/2024   Yearly kidney health urinalysis for diabetes  10/28/2024   Complete foot exam   01/28/2025   Medicare Annual Wellness Visit  03/09/2025   DTaP/Tdap/Td vaccine (2 - Td or Tdap) 04/10/2026   Pneumonia Vaccine  Completed   Flu Shot  Completed   DEXA scan (bone density measurement)  Completed   Hepatitis C Screening  Completed   Zoster (Shingles) Vaccine  Completed   HPV Vaccine  Aged Out   Colon Cancer Screening  Discontinued    Advanced directives: (In Chart) A copy of your advanced directives are scanned into your chart should your provider ever need it.  Next Medicare Annual Wellness Visit scheduled for next year: Yes

## 2024-03-16 DIAGNOSIS — E119 Type 2 diabetes mellitus without complications: Secondary | ICD-10-CM | POA: Diagnosis not present

## 2024-03-16 DIAGNOSIS — H353131 Nonexudative age-related macular degeneration, bilateral, early dry stage: Secondary | ICD-10-CM | POA: Diagnosis not present

## 2024-03-29 DIAGNOSIS — H18523 Epithelial (juvenile) corneal dystrophy, bilateral: Secondary | ICD-10-CM | POA: Diagnosis not present

## 2024-04-08 DIAGNOSIS — H18523 Epithelial (juvenile) corneal dystrophy, bilateral: Secondary | ICD-10-CM | POA: Diagnosis not present

## 2024-04-13 ENCOUNTER — Other Ambulatory Visit (HOSPITAL_BASED_OUTPATIENT_CLINIC_OR_DEPARTMENT_OTHER): Payer: Self-pay

## 2024-04-13 ENCOUNTER — Other Ambulatory Visit: Payer: Self-pay | Admitting: Family Medicine

## 2024-04-13 MED ORDER — RAMIPRIL 10 MG PO CAPS
10.0000 mg | ORAL_CAPSULE | Freq: Every day | ORAL | 3 refills | Status: AC
  Filled 2024-04-13: qty 100, 100d supply, fill #0
  Filled 2024-08-01: qty 100, 100d supply, fill #1
  Filled 2024-11-20: qty 100, 100d supply, fill #2

## 2024-04-18 ENCOUNTER — Other Ambulatory Visit (HOSPITAL_BASED_OUTPATIENT_CLINIC_OR_DEPARTMENT_OTHER): Payer: Self-pay

## 2024-04-18 ENCOUNTER — Other Ambulatory Visit: Payer: Self-pay | Admitting: Cardiology

## 2024-04-21 ENCOUNTER — Other Ambulatory Visit (HOSPITAL_BASED_OUTPATIENT_CLINIC_OR_DEPARTMENT_OTHER): Payer: Self-pay

## 2024-04-21 ENCOUNTER — Telehealth: Payer: Self-pay | Admitting: *Deleted

## 2024-04-21 ENCOUNTER — Other Ambulatory Visit: Payer: Self-pay | Admitting: Family Medicine

## 2024-04-21 MED ORDER — VERAPAMIL HCL ER 180 MG PO TBCR
180.0000 mg | EXTENDED_RELEASE_TABLET | Freq: Every evening | ORAL | 3 refills | Status: AC
  Filled 2024-04-21: qty 90, 90d supply, fill #0
  Filled 2024-07-23: qty 90, 90d supply, fill #1
  Filled 2024-10-29: qty 90, 90d supply, fill #2

## 2024-04-21 NOTE — Telephone Encounter (Signed)
 Copied from CRM [id number]. Topic: Clinical - Medication Question >> Apr 21, 2024 12:50 PM Allyne Areola wrote: Reason for CRM: Patient is calling because Dr.Banks discontinued verapamil  (CALAN -SR) 180 MG CR tablet, but her cardiologist advised that she should still be taking this medication. Pharmacy advised since Dr.Banks was the provider who discontinued it she needed to reorder it. Patient has five tablets left that will hold her over for the weekend.

## 2024-04-21 NOTE — Telephone Encounter (Signed)
 This provider did not discontinue the patient's verapamil .  It appears it was discontinued by staff.  Not sure why the heart doctor could not refill the heart medication but I digress.  The important thing is that the patient gets her medication.  The prescription was sent to the pharmacy with refills.

## 2024-05-13 ENCOUNTER — Other Ambulatory Visit: Payer: Self-pay

## 2024-06-16 DIAGNOSIS — R109 Unspecified abdominal pain: Secondary | ICD-10-CM | POA: Diagnosis not present

## 2024-06-16 DIAGNOSIS — R1084 Generalized abdominal pain: Secondary | ICD-10-CM | POA: Diagnosis not present

## 2024-06-16 DIAGNOSIS — R1110 Vomiting, unspecified: Secondary | ICD-10-CM | POA: Diagnosis not present

## 2024-06-16 DIAGNOSIS — R112 Nausea with vomiting, unspecified: Secondary | ICD-10-CM | POA: Diagnosis not present

## 2024-06-16 DIAGNOSIS — E119 Type 2 diabetes mellitus without complications: Secondary | ICD-10-CM | POA: Diagnosis not present

## 2024-06-17 ENCOUNTER — Telehealth: Payer: Self-pay

## 2024-06-17 NOTE — Transitions of Care (Post Inpatient/ED Visit) (Signed)
 06/17/2024  Name: Tara Guerrero MRN: [id number] DOB: February 07, 1946  Today's TOC FU Call Status: Today's TOC FU Call Status:: Successful TOC FU Call Completed TOC FU Call Complete Date: 06/17/24 Patient's Name and Date of Birth confirmed.  Transition Care Management Follow-up Telephone Call Date of Discharge: 06/16/24  Items Reviewed: Did you receive and understand the discharge instructions provided?: Yes Any new allergies since your discharge?: No Dietary orders reviewed?: No Do you have support at home?: Yes People in Home [RPT]: grandchild(ren) Name of Support/Comfort Primary Source: Therapist, art  Medications Reviewed Today: Medications Reviewed Today     Reviewed by Cristie Donate, CMA (Certified Medical Assistant) on 06/17/24 at 1022  Med List Status: <None>   Medication Order Taking? Sig Documenting Provider Last Dose Status Informant  acetaminophen  (TYLENOL ) 500 MG tablet [id number] Yes Take 500 mg by mouth every 6 (six) hours as needed for moderate pain. [provider]  Active Self, Pharmacy Records  APPLE CIDER VINEGAR PO [id number] Yes Take by mouth. [provider]  Active Self, Pharmacy Records  Ascorbic Acid (VITAMIN C) 1000 MG tablet [id number] Yes Take 1,000 mg by mouth 2 (two) times a week. [provider]  Active Self, Pharmacy Records  aspirin  EC 81 MG tablet [id number] Yes Take 81 mg by mouth daily. [provider]  Active Self, Pharmacy Records  atorvastatin  (LIPITOR) 20 MG tablet [id number] Yes Take 1 tablet (20 mg total) by mouth daily. Viola Greulich, MD  Active   Blood Glucose Monitoring Suppl (ONE TOUCH ULTRA 2) w/Device KIT [id number] Yes Use to Check Blood glucose daily.( Dx:Z51.81) Viola Greulich, MD  Active Self, Pharmacy Records  Blood Glucose Monitoring Suppl DEVI [id number] Yes 1 each by Does not apply route in the morning, at noon, and at bedtime. May substitute to any manufacturer covered by patient's insurance.  Viola Greulich, MD  Active Self, Pharmacy Records  buPROPion  ER (WELLBUTRIN  SR) 100 MG 12 hr tablet [id number] Yes Take 1 tablet (100 mg total) by mouth daily. Viola Greulich, MD  Active   Calcium  Carb-Cholecalciferol (CALCIUM  600 + D PO) [id number] Yes Take 1 tablet by mouth 2 (two) times a week. [provider]  Active Self, Pharmacy Records  cetirizine (ZYRTEC) 10 MG tablet [id number] Yes Take 10 mg by mouth daily. [provider]  Active Self, Pharmacy Records  cycloSPORINE  (RESTASIS ) 0.05 % ophthalmic emulsion [id number] Yes Place 1 drop into both eyes every 12 (twelve) hours.   Active Self, Pharmacy Records  Empagliflozin -metFORMIN  HCl (SYNJARDY ) 12.04-999 MG TABS [id number] Yes Take 1 tablet by mouth 2 (two) times daily. Viola Greulich, MD  Active   glipiZIDE  (GLUCOTROL ) 5 MG tablet [id number] Yes Take 1 tablet by mouth daily. Viola Greulich, MD  Active   hydrochlorothiazide  (HYDRODIURIL ) 25 MG tablet [id number] Yes Take 1 tablet (25 mg total) by mouth daily. Marquetta Sit, MD  Active   Lancets [facility] Jewelene Morton PLUS [place]) MISC [id number] Yes Use to check blood sugar up to 3 times daily. Viola Greulich, MD  Active Self, Pharmacy Records  Multiple Vitamin (MULTIVITAMIN WITH MINERALS) TABS tablet [id number] Yes Take 1 tablet by mouth 2 (two) times a week. [provider]  Active Self, Pharmacy Records  ramipril  (ALTACE ) 10 MG capsule [id number] Yes Take 1 capsule (10 mg total) by mouth daily. Viola Greulich, MD  Active   verapamil  (CALAN -SR) 180 MG CR tablet [id number] Yes Take 1 tablet by mouth at bedtime. [provider]  Active   verapamil  (CALAN -SR) 180 MG CR tablet [id number] Yes Take 1 tablet (180 mg total) by mouth at bedtime. Viola Greulich, MD  Active             Home Care and Equipment/Supplies: Were Home Health Services Ordered?: No Any new equipment or medical supplies ordered?: No  Functional Questionnaire: Do you need  assistance with bathing/showering or dressing?: No Do you need assistance with meal preparation?: No Do you need assistance with eating?: No Do you have difficulty maintaining continence: No Do you need assistance with getting out of bed/getting out of a chair/moving?: No Do you have difficulty managing or taking your medications?: No  Follow up appointments reviewed: PCP Follow-up appointment confirmed?: Yes Date of PCP follow-up appointment?: 06/23/24 Follow-up Provider: [facility] Follow-up appointment confirmed?: NA Do you need transportation to your follow-up appointment?: No Do you understand care options if your condition(s) worsen?: Yes-patient verbalized understanding    SIGNATURE Urijah Raynor, cma

## 2024-06-22 ENCOUNTER — Ambulatory Visit (INDEPENDENT_AMBULATORY_CARE_PROVIDER_SITE_OTHER): Admitting: Family Medicine

## 2024-06-22 ENCOUNTER — Other Ambulatory Visit (HOSPITAL_BASED_OUTPATIENT_CLINIC_OR_DEPARTMENT_OTHER): Payer: Self-pay

## 2024-06-22 ENCOUNTER — Encounter: Payer: Self-pay | Admitting: Family Medicine

## 2024-06-22 VITALS — BP 120/60 | HR 86 | Temp 97.7°F | Ht 65.0 in | Wt 176.0 lb

## 2024-06-22 DIAGNOSIS — G63 Polyneuropathy in diseases classified elsewhere: Secondary | ICD-10-CM | POA: Diagnosis not present

## 2024-06-22 DIAGNOSIS — H6091 Unspecified otitis externa, right ear: Secondary | ICD-10-CM | POA: Diagnosis not present

## 2024-06-22 DIAGNOSIS — E119 Type 2 diabetes mellitus without complications: Secondary | ICD-10-CM

## 2024-06-22 DIAGNOSIS — Z7984 Long term (current) use of oral hypoglycemic drugs: Secondary | ICD-10-CM

## 2024-06-22 DIAGNOSIS — R252 Cramp and spasm: Secondary | ICD-10-CM | POA: Diagnosis not present

## 2024-06-22 DIAGNOSIS — R197 Diarrhea, unspecified: Secondary | ICD-10-CM

## 2024-06-22 LAB — CBC WITH DIFFERENTIAL/PLATELET
Basophils Absolute: 0.1 10*3/uL (ref 0.0–0.1)
Basophils Relative: 0.6 % (ref 0.0–3.0)
Eosinophils Absolute: 0.5 10*3/uL (ref 0.0–0.7)
Eosinophils Relative: 4.3 % (ref 0.0–5.0)
HCT: 43.5 % (ref 36.0–46.0)
Hemoglobin: 14.7 g/dL (ref 12.0–15.0)
Lymphocytes Relative: 17.9 % (ref 12.0–46.0)
Lymphs Abs: 1.9 10*3/uL (ref 0.7–4.0)
MCHC: 33.8 g/dL (ref 30.0–36.0)
MCV: 83.5 fl (ref 78.0–100.0)
Monocytes Absolute: 0.9 10*3/uL (ref 0.1–1.0)
Monocytes Relative: 9 % (ref 3.0–12.0)
Neutro Abs: 7.2 10*3/uL (ref 1.4–7.7)
Neutrophils Relative %: 68.2 % (ref 43.0–77.0)
Platelets: 308 10*3/uL (ref 150.0–400.0)
RBC: 5.21 Mil/uL — ABNORMAL HIGH (ref 3.87–5.11)
RDW: 13.9 % (ref 11.5–15.5)
WBC: 10.5 10*3/uL (ref 4.0–10.5)

## 2024-06-22 LAB — COMPREHENSIVE METABOLIC PANEL WITH GFR
ALT: 22 U/L (ref 0–35)
AST: 20 U/L (ref 0–37)
Albumin: 4.4 g/dL (ref 3.5–5.2)
Alkaline Phosphatase: 65 U/L (ref 39–117)
BUN: 16 mg/dL (ref 6–23)
CO2: 32 meq/L (ref 19–32)
Calcium: 10 mg/dL (ref 8.4–10.5)
Chloride: 96 meq/L (ref 96–112)
Creatinine, Ser: 0.9 mg/dL (ref 0.40–1.20)
GFR: 60.74 mL/min (ref >60.00)
Glucose, Bld: 139 mg/dL — ABNORMAL HIGH (ref 70–99)
Potassium: 4 meq/L (ref 3.5–5.1)
Sodium: 137 meq/L (ref 135–145)
Total Bilirubin: 0.5 mg/dL (ref 0.2–1.2)
Total Protein: 7.3 g/dL (ref 6.0–8.3)

## 2024-06-22 LAB — MAGNESIUM: Magnesium: 1.8 mg/dL (ref 1.5–2.5)

## 2024-06-22 LAB — VITAMIN B12: Vitamin B-12: 1494 pg/mL — ABNORMAL HIGH (ref 211–911)

## 2024-06-22 LAB — T4, FREE: Free T4: 0.91 ng/dL (ref 0.60–1.60)

## 2024-06-22 LAB — TSH: TSH: 1.31 u[IU]/mL (ref 0.35–5.50)

## 2024-06-22 MED ORDER — HYDROCORTISONE-ACETIC ACID 1-2 % OT SOLN
3.0000 [drp] | Freq: Three times a day (TID) | OTIC | 0 refills | Status: AC
Start: 2024-06-22 — End: ?
  Filled 2024-06-22: qty 10, 22d supply, fill #0

## 2024-06-22 MED ORDER — METFORMIN HCL ER 500 MG PO TB24
1000.0000 mg | ORAL_TABLET | Freq: Two times a day (BID) | ORAL | 0 refills | Status: DC
  Filled 2024-06-22: qty 56, 14d supply, fill #0

## 2024-06-22 NOTE — Progress Notes (Signed)
 Established Patient Office Visit   Subjective  Patient ID: Tara Guerrero, female    DOB: 10/17/1946  Age: 78 y.o. MRN: [id number]  Chief Complaint  Patient presents with   Hospitalization Follow-up    See on 06/19 for vomiting and diarrhea; vomiting has gotten better, no more since ED visit but diarrhea is constant, about 5 times just in the mornings; think its due to medications    Patient is a 78 year old female seen for ED follow-up and several ongoing concerns.  Patient initially seen in ED in 06/15/2024 for nausea, vomiting, diarrhea thought 2/2 gastroenteritis.  Patient states symptoms started last Thursday but she started feeling better yesterday.  Patient notes history of loose, watery stools, 2-5 episodes per day times years.  States stools typically occur in the morning may have 1 in the evening.  Feels like medications are causing symptoms.  No recent changes in meds.  Has been on current meds for several years.  Denies changes in bowels if becomes nervous or anxious.  No history of IBS and pt or family.  States blood sugar has been good typically around 148.  Did have a low reading of 95 a few hours after eating breakfast.  No recent changes in diet.  Has cereal for breakfast and a piece of fruit, sandwich for lunch, meat and starch for dinner.  Patient mentions cramping in the right ankle/calf.  Happening for months.  Can occur anytime.  Patient also mentions brief episodes of dizziness, nausea, feeling tired/weak that can occur monthly.  Also had a slight headache.  Denies vertigo.  States blood sugars typically okay at time of episodes.  Patient endorses edema of right ear canal.  States audiologist mentioned during fitting for new hearing aids 6 weeks ago.  Patient was having discomfort placing old pair of hearing aids in the ER.  Has new smaller ear tips that still cause discomfort.  Denies changes in hearing, erythema, drainage, pain in the ear.  Sleeps on right side due to  history of left hip replacement however now unable to get comfortable.   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   Controlled type 2 diabetes mellitus without complication, without long-term current use of insulin  (HCC) 02/02/2024   Paresthesia 02/02/2024   Abrasion of ear canal, right, initial encounter 10/09/2023   Syncope and collapse 07/19/2023   Leukocytosis 07/19/2023   Pneumonia due to COVID-19 virus 07/19/2023   Uncontrolled type 2 diabetes mellitus with hyperglycemia, without long-term current use of insulin  (HCC) 07/19/2023   Renal insufficiency 07/19/2023   Depression 07/19/2023   Palpitations 02/23/2023   Chest pain of uncertain etiology 02/23/2023   Essential hypertension 06/08/2019   Hyperlipidemia associated with type 2 diabetes mellitus (HCC) 06/08/2019   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   Depression    Diabetes mellitus without complication (HCC)    Diverticulosis    Hyperlipidemia    Hyperplastic colon polyp 11/2014   Hypertension    Sleep apnea    Past Surgical History:  Procedure Laterality Date   ABDOMINAL HYSTERECTOMY  1976   s/p uterine CA   BREAST EXCISIONAL BIOPSY Bilateral    BREAST SURGERY  1995   L breast node removal   BREAST SURGERY     R breast biopsy   CHOLECYSTECTOMY N/A 10/17/2021   Procedure: LAPAROSCOPIC CHOLECYSTECTOMY;  Surgeon: Vanderbilt Ned, MD;  Location:  [facility] OR;  Service: General;  Laterality: N/A;   EYE MUSCLE SURGERY  2019   EYE SURGERY  2017   bilateral cataract surgery   GALLBLADDER SURGERY     JOINT REPLACEMENT  2004   bilateral knee replacements   TONSILLECTOMY  1950   Social History   Tobacco Use   Smoking status: Former   Smokeless tobacco: Never  Vaping Use   Vaping status: Never Used  Substance Use Topics   Alcohol use: Yes    Alcohol/week: 1.0 standard drink of alcohol    Types: 1 Glasses of  wine per week    Comment: Social   Drug use: Never   Family History  Problem Relation Age of Onset   Cancer Mother    Hyperlipidemia Mother    Hypertension Mother    Stroke Mother    Breast cancer Mother        diagnosed in her 56's   Alcohol abuse Father    Cancer Sister    Hyperlipidemia Sister    Hypertension Sister    Stroke Sister    Breast cancer Sister        diagnosed in her 34's   Mental illness Maternal Grandmother    Hypertension Maternal Grandmother    Hyperlipidemia Maternal Grandmother    Mental retardation Maternal Grandmother    Hearing loss Maternal Grandfather    Stroke Maternal Grandfather    Alcohol abuse Paternal Grandmother    Alcohol abuse Paternal Grandfather    Breast cancer Cousin        diagnosed in her 71's   Breast cancer Sister 3   Breast cancer Other        diagnosed late 20's   Allergies  Allergen Reactions   Codeine Other (See Comments)    Agitation  Other Reaction: Other reaction    Agitation    ROS Negative unless stated above    Objective:     BP 120/60   Pulse 86   Temp 97.7 F (36.5 C)   Ht 5' 5 (1.651 m)   Wt 176 lb (79.8 kg)   SpO2 95%   BMI 29.29 kg/m  BP Readings from Last 3 Encounters:  06/22/24 120/60  02/29/24 128/62  01/29/24 120/78   Wt Readings from Last 3 Encounters:  06/22/24 176 lb (79.8 kg)  03/09/24 182 lb (82.6 kg)  02/29/24 182 lb 9.6 oz (82.8 kg)      Physical Exam Constitutional:      General: She is not in acute distress.    Appearance: Normal appearance.  HENT:     Head: Normocephalic and atraumatic.     Nose: Nose normal.     Mouth/Throat:     Mouth: Mucous membranes are moist.   Eyes:     Extraocular Movements: Extraocular movements intact.     Conjunctiva/sclera: Conjunctivae normal.    Cardiovascular:     Rate and Rhythm: Normal rate and regular rhythm.     Heart sounds: Normal heart sounds. No murmur heard.    No gallop.  Pulmonary:     Effort: Pulmonary effort  is normal. No respiratory distress.     Breath sounds: Normal breath sounds. No wheezing, rhonchi or rales.  Abdominal:     Palpations: Abdomen is soft.   Skin:    General: Skin is warm and dry.   Neurological:     Mental Status: She is alert and oriented to person, place, and time. Mental status is at baseline.   Psychiatric:  Attention and Perception: Attention normal.        Mood and Affect: Mood normal. Affect is flat.        Speech: Speech normal.        Behavior: Behavior normal. Behavior is cooperative.        03/09/2024   10:15 AM 10/29/2023    9:37 AM 07/30/2023   10:29 AM  Depression screen PHQ 2/9  Decreased Interest 0 0 0  Down, Depressed, Hopeless 0 0 0  PHQ - 2 Score 0 0 0  Altered sleeping  0 0  Tired, decreased energy  0 1  Change in appetite  1 1  Feeling bad or failure about yourself   0 0  Trouble concentrating  0 0  Moving slowly or fidgety/restless  0 0  Suicidal thoughts  0 0  PHQ-9 Score  1 2  Difficult doing work/chores  Not difficult at all Not difficult at all      10/29/2023    9:38 AM 07/30/2023   10:30 AM 02/09/2023   10:38 AM  GAD 7 : Generalized Anxiety Score  Nervous, Anxious, on Edge 0 0 0  Control/stop worrying 0 0 0  Worry too much - different things 0 0 0  Trouble relaxing 0 0 0  Restless 0 0 0  Easily annoyed or irritable 0 0 0  Afraid - awful might happen 0 0 0  Total GAD 7 Score 0 0 0  Anxiety Difficulty Not difficult at all       No results found for any visits on 06/22/24.    Assessment & Plan:   Diarrhea, unspecified type -     Ambulatory referral to Gastroenterology -     CBC with Differential/Platelet; Future -     Comprehensive metabolic panel with GFR; Future -     TSH; Future -     T4, free -     AMB Referral [facility] Care Management  Muscle cramps -     Vitamin B12; Future -     Comprehensive metabolic panel with GFR; Future -     TSH; Future -     Magnesium; Future  Inflammation of right ear  canal -     Hydrocortisone-Acetic Acid; Place 3 drops into the right ear 3 (three) times daily.  Dispense: 10 mL; Refill: 0  Controlled type 2 diabetes mellitus without complication, without long-term current use of insulin  (HCC) -     AMB Referral [facility] Care Management -     metFORMIN  HCl ER; Take 2 tablets (1,000 mg total) by mouth 2 (two) times daily with a meal for 14 days.  Dispense: 56 tablet; Refill: 0  Polyneuropathy associated with underlying disease (HCC) -     Vitamin B12; Future -     Comprehensive metabolic panel with GFR; Future -     AMB Referral [facility] Care Management  Loose stools times years.  No recent changes in meds.  Concern for IBS.  Patient worried medications may be contributing discussed holding Synjardy  12.04-999 mg twice daily and starting Jardiance  25 mg daily and metformin  extended release 1000 mg twice a day for 2 weeks to see if patient notices improvement in symptoms.  If no improvement noted okay to return to taking Synjardy .  If improvement noted patient to notify clinic.  Given samples of Jardiance  in clinic and a short-term supply of metformin  extended release sent to pharmacy.  Last hemoglobin A1c was 7.7% on 01/29/2024.  Continue lifestyle modifications.  Continue glipizide  5 mg daily.  Will obtain labs to evaluate muscle cramping.  Discussed Lipitor may be contributing to symptoms.  Okay for patient to try taking med 3 times per week to see if she notices improvement cramping.  Inflammation of right ear canal making it difficult to put right hearing aid in place.  Hydrocortisone eardrops sent to pharmacy.  For continued/unresolved symptoms referral to ENT.  Return in about 3 months (around 09/22/2024), or if symptoms worsen or fail to improve.   Clotilda JONELLE Single, MD

## 2024-06-22 NOTE — Patient Instructions (Addendum)
 A referral to the Gastroenterologist (stomach doctor) was placed.  They will call you about setting up the appt.  You can stop taking Synjardy  12.04-999 mg twice a day.  Do not throw away the medication but put it to the side.  You can try taking the metformin  extended release 1000 mg twice a day along with samples of the empagliflozin  (Jardiance ) which is the other medication and the combination pill.  We are providing you with samples of Jardiance  from clinic.  The clinic samples are 25 mg each.  This means you only take 1/day, not twice a day.    Imaging Results - CT Abdomen Pelvis W Contrast (06/16/2024 3:51 PM EDT) Procedure Note  Bryan Juliene Nottingham, MD - 06/16/2024  Formatting of this note might be different from the original. COMPARISON: No priors available  CLINICAL INDICATION: generalized abdominal pain and tenderness vomiting, eval diverticulitis/biliary pathology  Additional history: None  TECHNIQUE: After the uneventful intravenous administration of 93 ml Opti-ray 320, helical CT images of the abdomen and pelvis were obtained. Oral contrast was not administered prior to the exam. Subsequently, axial images with coronal and sagittal reconstructions were reviewed.  For all [facility] System CT exams, one or more of the following radiation dose reduction techniques were used to minimize dose exposure while maintaining the necessary diagnostic image quality. Automated exposure control, BMI based dose modulation, and iterative reconstruction technique.   FINDINGS:  The lung bases demonstrate atelectasis and scarring. The heart is globally enlarged. 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  There is hepatic steatosis. The gallbladder has been removed. There is no biliary dilatation.  The spleen, adrenal glands, pancreas, and kidneys are unremarkable.  There is no bowel obstruction. The appendix is normal.  There is no free air or fluid. There is no enlarged adenopathy. The  major vessels are patent. There is scattered calcified atherosclerosis.  Surgical lips are noted along the bilateral pelvic sidewalls. The bladder and reproductive organs are unremarkable. The uterus has been removed.  There is multilevel degenerative disc disease. There are no acute osseous abnormalities.  IMPRESSION: 1. No acute findings in the abdomen or pelvis. 2. Numerous nonemergent findings are detailed above.   Signed (Electronic Signature): 06/16/2024 3:58 PM Signed By: Juliene Bryan, MD

## 2024-06-23 ENCOUNTER — Telehealth: Payer: Self-pay | Admitting: *Deleted

## 2024-06-23 ENCOUNTER — Other Ambulatory Visit (HOSPITAL_BASED_OUTPATIENT_CLINIC_OR_DEPARTMENT_OTHER): Payer: Self-pay

## 2024-06-23 NOTE — Progress Notes (Signed)
 Complex Care Management Note  Care Guide Note 06/23/2024 Name: Tara Guerrero MRN: [id number] DOB: [birth year]  Tara Guerrero is a 78 y.o. year old female who sees Mercer Clotilda SAUNDERS, MD for primary care. I reached out to Levorn MALVA Pinal by phone today to offer complex care management services.  [name] was given information about Complex Care Management services today including:   The Complex Care Management services include support from the care team which includes your Nurse Care Manager, Clinical Social Worker, or Pharmacist.  The Complex Care Management team is here to help remove barriers to the health concerns and goals most important to you. Complex Care Management services are voluntary, and the patient may decline or stop services at any time by request to their care team member.   Complex Care Management Consent Status: Patient agreed to services and verbal consent obtained.   Follow up plan:  Telephone appointment with complex care management team member scheduled for:  06/27/2024  Encounter Outcome:  Patient Scheduled  Thedford Franks, CMA [place]  [facility], [facility] Guide Direct Dial: [phone]  Fax: [phone] Website: [email or web address]

## 2024-06-23 NOTE — Progress Notes (Signed)
 Care Guide Pharmacy Note  06/23/2024 Name: [name] MRN: [id number] DOB: 12-11-1946  Referred By: Mercer Clotilda SAUNDERS, MD Reason for referral: Complex Care Management (Outreach to schedule referral with pharmacist ) and Call Attempt #1   Tara Guerrero is a 78 y.o. year old female who is a primary care patient of Mercer Clotilda SAUNDERS, MD.  Levorn MALVA Pinal was referred to the pharmacist for assistance related to: DMII  An unsuccessful telephone outreach was attempted today to contact the patient who was referred to the pharmacy team for assistance with medication management. Additional attempts will be made to contact the patient.  Thedford Franks, CMA [place]  [facility], [facility] Guide Direct Dial: [phone]  Fax: [phone] Website: [email or web address]

## 2024-06-24 DIAGNOSIS — G4733 Obstructive sleep apnea (adult) (pediatric): Secondary | ICD-10-CM | POA: Diagnosis not present

## 2024-06-27 ENCOUNTER — Ambulatory Visit: Payer: Self-pay | Admitting: Family Medicine

## 2024-06-27 ENCOUNTER — Other Ambulatory Visit: Payer: Self-pay

## 2024-06-27 ENCOUNTER — Other Ambulatory Visit (INDEPENDENT_AMBULATORY_CARE_PROVIDER_SITE_OTHER)

## 2024-06-27 DIAGNOSIS — E119 Type 2 diabetes mellitus without complications: Secondary | ICD-10-CM

## 2024-06-27 NOTE — Progress Notes (Signed)
   06/27/2024  Patient ID: Tara Guerrero, female   DOB: Mar 20, 1946, 78 y.o.   MRN: [id number]  Reached out to patient via referral from PCP for medication review and to address patient concern of possibly medications causing diarrhea.  Patient met with PCP in office last week and made note of ongoing diarrhea. Patient referred to GI but also identified synjardy  may be causing the symptoms. PCP provided Jardiance  25mg  samples and sent in rx for Metformin  XR for 2 week trial to see if this resolves the gi issues.  At time of this call, patient still taking synjardy . Counseled patient on how to properly take the jardiance  and metformin  xr and to hold synjardy  while taking.  Patient plans to start tmrw.  Follow Up Scheduled: 07/11/24  [name], PharmD Clinical Pharmacist [phone]

## 2024-07-07 ENCOUNTER — Encounter: Payer: Self-pay | Admitting: Family Medicine

## 2024-07-11 ENCOUNTER — Other Ambulatory Visit: Payer: Self-pay

## 2024-07-11 ENCOUNTER — Other Ambulatory Visit (INDEPENDENT_AMBULATORY_CARE_PROVIDER_SITE_OTHER)

## 2024-07-11 ENCOUNTER — Other Ambulatory Visit (HOSPITAL_BASED_OUTPATIENT_CLINIC_OR_DEPARTMENT_OTHER): Payer: Self-pay

## 2024-07-11 DIAGNOSIS — E119 Type 2 diabetes mellitus without complications: Secondary | ICD-10-CM

## 2024-07-11 MED ORDER — EMPAGLIFLOZIN 25 MG PO TABS
25.0000 mg | ORAL_TABLET | Freq: Every day | ORAL | 3 refills | Status: AC
Start: 2024-07-11 — End: ?

## 2024-07-11 MED ORDER — METFORMIN HCL ER 500 MG PO TB24
1000.0000 mg | ORAL_TABLET | Freq: Two times a day (BID) | ORAL | 3 refills | Status: AC
  Filled 2024-07-11: qty 360, 90d supply, fill #0
  Filled 2024-11-13: qty 360, 90d supply, fill #1

## 2024-07-11 NOTE — Progress Notes (Signed)
   07/11/2024  Patient ID: Tara Guerrero, female   DOB: November 02, 1946, 78 y.o.   MRN: [id number]  Reached out to patient via referral from PCP for medication review in relation to diabetes.  Patient switched from Synjardy  to Jardiance  and Metformin  XR and stomach issues completely resolved per patient report.  Notified PCP, sending in rx for Jardiance  to [facility] and metformin  xr to local pharmacy. Providing patient with more jardiance  samples while this change takes place. Notifying [facility] to stop synjardy  as this replaces.  Follow Up: 4 weeks   [name], PharmD Clinical Pharmacist [phone]   [name], PharmD Clinical Pharmacist [phone]

## 2024-07-13 ENCOUNTER — Telehealth: Payer: Self-pay

## 2024-07-13 NOTE — Telephone Encounter (Signed)
 Patient called; states she hasn't gotten a call that her DM shoes are in. Her last chart note with us  states she was measured 01/26/2024. Checked SS, the last time we ordered shoes for this pt was 10/30/2022.

## 2024-07-14 DIAGNOSIS — H02831 Dermatochalasis of right upper eyelid: Secondary | ICD-10-CM | POA: Diagnosis not present

## 2024-07-14 DIAGNOSIS — H0279 Other degenerative disorders of eyelid and periocular area: Secondary | ICD-10-CM | POA: Diagnosis not present

## 2024-07-14 DIAGNOSIS — H57813 Brow ptosis, bilateral: Secondary | ICD-10-CM | POA: Diagnosis not present

## 2024-07-14 DIAGNOSIS — H02834 Dermatochalasis of left upper eyelid: Secondary | ICD-10-CM | POA: Diagnosis not present

## 2024-07-23 ENCOUNTER — Other Ambulatory Visit (HOSPITAL_BASED_OUTPATIENT_CLINIC_OR_DEPARTMENT_OTHER): Payer: Self-pay

## 2024-07-26 ENCOUNTER — Telehealth: Payer: Self-pay | Admitting: Family Medicine

## 2024-07-26 ENCOUNTER — Ambulatory Visit

## 2024-07-26 DIAGNOSIS — E1149 Type 2 diabetes mellitus with other diabetic neurological complication: Secondary | ICD-10-CM

## 2024-07-26 DIAGNOSIS — L84 Corns and callosities: Secondary | ICD-10-CM

## 2024-07-26 DIAGNOSIS — M2141 Flat foot [pes planus] (acquired), right foot: Secondary | ICD-10-CM

## 2024-07-26 DIAGNOSIS — M2041 Other hammer toe(s) (acquired), right foot: Secondary | ICD-10-CM

## 2024-07-26 DIAGNOSIS — M21619 Bunion of unspecified foot: Secondary | ICD-10-CM

## 2024-07-26 NOTE — Telephone Encounter (Signed)
 Patient dropped off document Physician Statement, to be filled out by provider. Patient requested to send it back via Fax within 7-days. Document is located in providers tray at front office.Please advise at Mobile [phone] (mobile)

## 2024-07-26 NOTE — Progress Notes (Signed)
 Patient presents to the office today for diabetic shoe and insole measuring.  Patient was measured with brannock device to determine size and width for 1 pair of extra depth shoes and foam casted for 3 pair of insoles.   Documentation of medical necessity will be sent to patient's treating diabetic doctor to verify and sign.   Patient's diabetic provider: Clotilda Single MD   Shoes and insoles will be ordered at that time and patient will be notified for an appointment for fitting when they arrive.   Shoe size (per patient): 9WD Brannock measurement: 9WD Shoe choice:   981 / 841 Shoe size ordered: 9WD  Notice on non-pmt signed for coins amount  HTA needs auth once DTD ppw recvd  And scans need to be sent as well

## 2024-07-27 DIAGNOSIS — G4733 Obstructive sleep apnea (adult) (pediatric): Secondary | ICD-10-CM | POA: Diagnosis not present

## 2024-07-28 NOTE — Telephone Encounter (Signed)
 Form has been filled out and faxed.

## 2024-08-01 ENCOUNTER — Other Ambulatory Visit: Payer: Self-pay

## 2024-08-01 ENCOUNTER — Other Ambulatory Visit: Payer: Self-pay | Admitting: Family Medicine

## 2024-08-01 ENCOUNTER — Other Ambulatory Visit (HOSPITAL_BASED_OUTPATIENT_CLINIC_OR_DEPARTMENT_OTHER): Payer: Self-pay

## 2024-08-01 MED ORDER — ATORVASTATIN CALCIUM 20 MG PO TABS
20.0000 mg | ORAL_TABLET | Freq: Every day | ORAL | 2 refills | Status: AC
  Filled 2024-08-01: qty 90, 90d supply, fill #0
  Filled 2024-10-24: qty 90, 90d supply, fill #1
  Filled 2025-01-25: qty 90, 90d supply, fill #2

## 2024-08-01 NOTE — Telephone Encounter (Signed)
 Copied from CRM [id number]. Topic: Clinical - Medication Refill >> Aug 01, 2024  9:06 AM Franky GRADE wrote: Medication: Rx #: [id number]  atorvastatin  (LIPITOR) 20 MG tablet [[id number]]    Has the patient contacted their pharmacy? Yes (Agent: If no, request that the patient contact the pharmacy for the refill. If patient does not wish to contact the pharmacy document the reason why and proceed with request.) (Agent: If yes, when and what did the pharmacy advise?)  This is the patient's preferred pharmacy:  [facility] [address] [place] [address] Phone: [phone] Fax: [phone]  Is this the correct pharmacy for this prescription? Yes If no, delete pharmacy and type the correct one.   Has the prescription been filled recently? No  Is the patient out of the medication? Yes  Has the patient been seen for an appointment in the last year OR does the patient have an upcoming appointment? Yes  Can we respond through MyChart? Yes  Agent: Please be advised that Rx refills may take up to 3 business days. We ask that you follow-up with your pharmacy.

## 2024-08-02 ENCOUNTER — Other Ambulatory Visit: Payer: Self-pay | Admitting: Family Medicine

## 2024-08-02 DIAGNOSIS — Z1231 Encounter for screening mammogram for malignant neoplasm of breast: Secondary | ICD-10-CM

## 2024-08-03 ENCOUNTER — Other Ambulatory Visit

## 2024-08-03 DIAGNOSIS — Z7984 Long term (current) use of oral hypoglycemic drugs: Secondary | ICD-10-CM

## 2024-08-03 DIAGNOSIS — E119 Type 2 diabetes mellitus without complications: Secondary | ICD-10-CM

## 2024-08-03 NOTE — Progress Notes (Signed)
   08/03/2024  Patient ID: Tara Guerrero, female   DOB: January 15, 1946, 78 y.o.   MRN: [id number]  Reached out to patient to follow up on Jardiance .  Patient confirms she received the shipment of Jardiance  from [facility]. Denies any issues or concerns with use. Feels very happy with her sugars and her stomach issues have remained resolved.  Reminded her to sch 3 month f/u with PCP, she will call back to do so when she has her calendar in front of her.  Follow Up: as indicated following PCP visit   [name], PharmD Clinical Pharmacist [phone]

## 2024-08-08 ENCOUNTER — Other Ambulatory Visit: Payer: Self-pay | Admitting: Family Medicine

## 2024-08-09 ENCOUNTER — Other Ambulatory Visit (HOSPITAL_BASED_OUTPATIENT_CLINIC_OR_DEPARTMENT_OTHER): Payer: Self-pay

## 2024-08-09 MED ORDER — HYDROCHLOROTHIAZIDE 25 MG PO TABS
25.0000 mg | ORAL_TABLET | Freq: Every day | ORAL | 2 refills | Status: AC
  Filled 2024-08-09: qty 90, 90d supply, fill #0
  Filled 2024-11-20: qty 90, 90d supply, fill #1

## 2024-08-15 ENCOUNTER — Other Ambulatory Visit (HOSPITAL_BASED_OUTPATIENT_CLINIC_OR_DEPARTMENT_OTHER): Payer: Self-pay

## 2024-08-15 ENCOUNTER — Other Ambulatory Visit: Payer: Self-pay

## 2024-08-16 ENCOUNTER — Other Ambulatory Visit (HOSPITAL_BASED_OUTPATIENT_CLINIC_OR_DEPARTMENT_OTHER): Payer: Self-pay

## 2024-08-16 ENCOUNTER — Other Ambulatory Visit: Payer: Self-pay

## 2024-08-16 MED ORDER — CYCLOSPORINE 0.05 % OP EMUL
1.0000 [drp] | Freq: Two times a day (BID) | OPHTHALMIC | 11 refills | Status: AC
  Filled 2024-08-16: qty 180, 90d supply, fill #0

## 2024-08-24 DIAGNOSIS — G4733 Obstructive sleep apnea (adult) (pediatric): Secondary | ICD-10-CM | POA: Diagnosis not present

## 2024-08-31 ENCOUNTER — Emergency Department (HOSPITAL_COMMUNITY)

## 2024-08-31 ENCOUNTER — Emergency Department (HOSPITAL_COMMUNITY)
Admission: EM | Admit: 2024-08-31 | Discharge: 2024-08-31 | Disposition: A | Discharge status: Home / Self Care | Attending: Emergency Medicine | Admitting: Emergency Medicine

## 2024-08-31 ENCOUNTER — Encounter (HOSPITAL_COMMUNITY): Payer: Self-pay | Admitting: Emergency Medicine

## 2024-08-31 DIAGNOSIS — Z7982 Long term (current) use of aspirin: Secondary | ICD-10-CM | POA: Insufficient documentation

## 2024-08-31 DIAGNOSIS — I672 Cerebral atherosclerosis: Secondary | ICD-10-CM | POA: Diagnosis not present

## 2024-08-31 DIAGNOSIS — R739 Hyperglycemia, unspecified: Secondary | ICD-10-CM | POA: Insufficient documentation

## 2024-08-31 DIAGNOSIS — Z7984 Long term (current) use of oral hypoglycemic drugs: Secondary | ICD-10-CM | POA: Diagnosis not present

## 2024-08-31 DIAGNOSIS — W19XXXA Unspecified fall, initial encounter: Secondary | ICD-10-CM | POA: Insufficient documentation

## 2024-08-31 DIAGNOSIS — Z79899 Other long term (current) drug therapy: Secondary | ICD-10-CM | POA: Insufficient documentation

## 2024-08-31 DIAGNOSIS — M25561 Pain in right knee: Secondary | ICD-10-CM | POA: Diagnosis not present

## 2024-08-31 DIAGNOSIS — R55 Syncope and collapse: Secondary | ICD-10-CM | POA: Insufficient documentation

## 2024-08-31 DIAGNOSIS — Y9301 Activity, walking, marching and hiking: Secondary | ICD-10-CM | POA: Insufficient documentation

## 2024-08-31 DIAGNOSIS — E1165 Type 2 diabetes mellitus with hyperglycemia: Secondary | ICD-10-CM | POA: Diagnosis not present

## 2024-08-31 DIAGNOSIS — S8001XA Contusion of right knee, initial encounter: Secondary | ICD-10-CM | POA: Insufficient documentation

## 2024-08-31 DIAGNOSIS — R4182 Altered mental status, unspecified: Secondary | ICD-10-CM | POA: Diagnosis not present

## 2024-08-31 DIAGNOSIS — R42 Dizziness and giddiness: Secondary | ICD-10-CM | POA: Diagnosis not present

## 2024-08-31 DIAGNOSIS — S8991XA Unspecified injury of right lower leg, initial encounter: Secondary | ICD-10-CM | POA: Diagnosis present

## 2024-08-31 DIAGNOSIS — Z96651 Presence of right artificial knee joint: Secondary | ICD-10-CM | POA: Diagnosis not present

## 2024-08-31 LAB — URINALYSIS, ROUTINE W REFLEX MICROSCOPIC
Bacteria, UA: NONE SEEN
Bilirubin Urine: NEGATIVE
Glucose, UA: >=500 mg/dL — AB
Hgb urine dipstick: NEGATIVE
Ketones, ur: NEGATIVE mg/dL
Leukocytes,Ua: NEGATIVE
Nitrite: NEGATIVE
Protein, ur: NEGATIVE mg/dL
Specific Gravity, Urine: 1.017 (ref 1.005–1.030)
pH: 5 (ref 5.0–8.0)

## 2024-08-31 LAB — CBC
HCT: 43 % (ref 36.0–46.0)
Hemoglobin: 13.8 g/dL (ref 12.0–15.0)
MCH: 28.4 pg (ref 26.0–34.0)
MCHC: 32.1 g/dL (ref 30.0–36.0)
MCV: 88.5 fL (ref 80.0–100.0)
Platelets: 263 K/uL (ref 150–400)
RBC: 4.86 MIL/uL (ref 3.87–5.11)
RDW: 13.2 % (ref 11.5–15.5)
WBC: 11.7 K/uL — ABNORMAL HIGH (ref 4.0–10.5)
nRBC: 0 % (ref 0.0–0.2)

## 2024-08-31 LAB — COMPREHENSIVE METABOLIC PANEL WITH GFR
ALT: 18 U/L (ref 0–44)
AST: 25 U/L (ref 15–41)
Albumin: 3.6 g/dL (ref 3.5–5.0)
Alkaline Phosphatase: 68 U/L (ref 38–126)
Anion gap: 14 (ref 5–15)
BUN: 18 mg/dL (ref 8–23)
CO2: 20 mmol/L — ABNORMAL LOW (ref 22–32)
Calcium: 8.5 mg/dL — ABNORMAL LOW (ref 8.9–10.3)
Chloride: 104 mmol/L (ref 98–111)
Creatinine, Ser: 0.8 mg/dL (ref 0.44–1.00)
GFR, Estimated: >60 mL/min (ref >60)
Glucose, Bld: 207 mg/dL — ABNORMAL HIGH (ref 70–99)
Potassium: 3.7 mmol/L (ref 3.5–5.1)
Sodium: 137 mmol/L (ref 135–145)
Total Bilirubin: 0.5 mg/dL (ref 0.0–1.2)
Total Protein: 6 g/dL — ABNORMAL LOW (ref 6.5–8.1)

## 2024-08-31 LAB — TROPONIN T, HIGH SENSITIVITY
Troponin T High Sensitivity: <15 ng/L (ref 0–19)
Troponin T High Sensitivity: <15 ng/L (ref 0–19)

## 2024-08-31 MED ORDER — ACETAMINOPHEN 500 MG PO TABS
1000.0000 mg | ORAL_TABLET | Freq: Once | ORAL | Status: AC
  Administered 2024-08-31: 1000 mg via ORAL
  Filled 2024-08-31: qty 2

## 2024-08-31 MED ORDER — TRAMADOL HCL 50 MG PO TABS
50.0000 mg | ORAL_TABLET | Freq: Once | ORAL | Status: AC
  Administered 2024-08-31: 50 mg via ORAL
  Filled 2024-08-31: qty 1

## 2024-08-31 MED ORDER — LACTATED RINGERS IV BOLUS
1000.0000 mL | Freq: Once | INTRAVENOUS | Status: AC
  Administered 2024-08-31: 1000 mL via INTRAVENOUS

## 2024-08-31 MED ORDER — IBUPROFEN 200 MG PO TABS
200.0000 mg | ORAL_TABLET | Freq: Once | ORAL | Status: AC
  Administered 2024-08-31: 200 mg via ORAL
  Filled 2024-08-31: qty 1

## 2024-08-31 NOTE — ED Triage Notes (Signed)
 Pt BIB EMS after having a syncopal episode. Pt was walking her dog and started feeling dizzy. When EMS got there the pt had a syncopal episode and fell on her right knee. Pt did not hit her head.  Pt was given Bolus.

## 2024-08-31 NOTE — ED Provider Notes (Addendum)
 [place] EMERGENCY DEPARTMENT AT [facility] Provider Note   CSN: [id number] Arrival date & time: 08/31/24  1058     Patient presents with: Knee Pain and Near Syncope   Tara Guerrero is a 78 y.o. female.   Pt c/o near syncopal event while walking dog this AM. States was mid-walk when  began to feel faint/lightheaded as if may pass out. States went down onto knees, did not lose consciousness. Friend called EMS.  Denies associated chest pain or discomfort. No sob or unusual doe. No palpitations or sense of abnormal heart rhythm. No other recent history of syncope. Felt fine, at baseline this AM and just prior to symptom onset. Post fall, c/o right knee pain. Denies other pain or injury. No headache. No neck or back pain. No other extremity pain or injury, skin intact. No recent change in meds or new meds. No recent blood loss, rectal bleeding or melena. No fever or chills.   The history is provided by the patient, medical records and the EMS personnel.  Loss of Consciousness Associated symptoms: no chest pain, no fever, no headaches, no palpitations, no shortness of breath, no vomiting and no weakness   Knee Pain Associated symptoms: no back pain, no fever and no neck pain        Prior to Admission medications   Medication Sig Start Date End Date Taking? Authorizing Provider  acetaminophen  (TYLENOL ) 500 MG tablet Take 500 mg by mouth every 6 (six) hours as needed for moderate pain.    [provider]  acetic acid -hydrocortisone  (VOSOL -HC) OTIC solution Place 3 drops into the right ear 3 (three) times daily. 06/22/24   Mercer Clotilda SAUNDERS, MD  APPLE CIDER VINEGAR PO Take by mouth.    [provider]  Ascorbic Acid (VITAMIN C) 1000 MG tablet Take 1,000 mg by mouth 2 (two) times a week.    [provider]  aspirin  EC 81 MG tablet Take 81 mg by mouth daily.    [provider]  atorvastatin  (LIPITOR) 20 MG tablet Take 1 tablet (20 mg total) by  mouth daily. 08/01/24   Mercer Clotilda SAUNDERS, MD  Blood Glucose Monitoring Suppl (ONE TOUCH ULTRA 2) w/Device KIT Use to Check Blood glucose daily.( Dx:Z51.81) 01/31/19   Mercer Clotilda SAUNDERS, MD  Blood Glucose Monitoring Suppl DEVI 1 each by Does not apply route in the morning, at noon, and at bedtime. May substitute to any manufacturer covered by patient's insurance. 02/27/23   Mercer Clotilda SAUNDERS, MD  buPROPion  ER (WELLBUTRIN  SR) 100 MG 12 hr tablet Take 1 tablet (100 mg total) by mouth daily. 10/29/23   Mercer Clotilda SAUNDERS, MD  Calcium  Carb-Cholecalciferol (CALCIUM  600 + D PO) Take 1 tablet by mouth 2 (two) times a week.    [provider]  cetirizine (ZYRTEC) 10 MG tablet Take 10 mg by mouth daily.    [provider]  cycloSPORINE  (RESTASIS ) 0.05 % ophthalmic emulsion Place 1 drop into both eyes every 12 (twelve) hours. 08/16/24     empagliflozin  (JARDIANCE ) 25 MG TABS tablet Take 1 tablet (25 mg total) by mouth daily before breakfast. Stop Synjardy . 07/11/24   Mercer Clotilda SAUNDERS, MD  glipiZIDE  (GLUCOTROL ) 5 MG tablet Take 1 tablet by mouth daily. 11/02/23   Mercer Clotilda SAUNDERS, MD  hydrochlorothiazide  (HYDRODIURIL ) 25 MG tablet Take 1 tablet (25 mg total) by mouth daily. 08/09/24   Mercer Clotilda SAUNDERS, MD  Lancets [facility] DELICA PLUS [place]) MISC Use to check blood  sugar up to 3 times daily. 04/13/23   Mercer Clotilda SAUNDERS, MD  metFORMIN  (GLUCOPHAGE -XR) 500 MG 24 hr tablet Take 2 tablets (1,000 mg total) by mouth 2 (two) times daily with a meal. 07/11/24 07/06/25  Mercer Clotilda SAUNDERS, MD  Multiple Vitamin (MULTIVITAMIN WITH MINERALS) TABS tablet Take 1 tablet by mouth 2 (two) times a week.    [provider]  ramipril  (ALTACE ) 10 MG capsule Take 1 capsule (10 mg total) by mouth daily. 04/13/24   Mercer Clotilda SAUNDERS, MD  verapamil  (CALAN -SR) 180 MG CR tablet Take 1 tablet by mouth at bedtime. 02/13/20   [provider]  verapamil  (CALAN -SR) 180 MG CR tablet Take 1 tablet (180 mg total) by mouth at  bedtime. 04/21/24   Mercer Clotilda SAUNDERS, MD    Allergies: Codeine    Review of Systems  Constitutional:  Negative for chills and fever.  HENT:  Negative for sore throat.   Eyes:  Negative for visual disturbance.  Respiratory:  Negative for cough and shortness of breath.   Cardiovascular:  Positive for syncope. Negative for chest pain, palpitations and leg swelling.  Gastrointestinal:  Negative for abdominal pain, blood in stool, diarrhea and vomiting.  Genitourinary:  Negative for dysuria, flank pain, hematuria and vaginal bleeding.  Musculoskeletal:  Negative for back pain and neck pain.  Skin:  Negative for wound.  Neurological:  Positive for light-headedness. Negative for speech difficulty, weakness, numbness and headaches.    Updated Vital Signs BP (!) 122/51   Pulse 62   Temp (!) 97.5 F (36.4 C) (Oral)   Resp (!) 21   SpO2 99%   Physical Exam Vitals and nursing note reviewed.  Constitutional:      Appearance: Normal appearance. She is well-developed.  HENT:     Head: Atraumatic.     Nose: Nose normal.     Mouth/Throat:     Mouth: Mucous membranes are moist.  Eyes:     General: No scleral icterus.    Conjunctiva/sclera: Conjunctivae normal.     Pupils: Pupils are equal, round, and reactive to light.  Neck:     Vascular: No carotid bruit.     Trachea: No tracheal deviation.  Cardiovascular:     Rate and Rhythm: Normal rate and regular rhythm.     Pulses: Normal pulses.     Heart sounds: Normal heart sounds. No murmur heard.    No friction rub. No gallop.  Pulmonary:     Effort: Pulmonary effort is normal. No respiratory distress.     Breath sounds: Normal breath sounds.  Chest:     Chest wall: No tenderness.  Abdominal:     General: Bowel sounds are normal. There is no distension.     Palpations: Abdomen is soft.     Tenderness: There is no abdominal tenderness. There is no guarding.  Genitourinary:    Comments: No cva tenderness.  Musculoskeletal:         General: No swelling.     Cervical back: Normal range of motion and neck supple. No rigidity or tenderness. No muscular tenderness.     Comments: CTLS spine, non tender, aligned, no step off. Tenderness right knee anteriorly, knee is grossly stable, no large effusion. Otherwise good rom bil extremities without other pain or focal bony tenderness. Distal pulses palp bil.   Skin:    General: Skin is warm and dry.     Findings: No rash.  Neurological:     Mental Status: She is alert.  Comments: Alert, speech normal. GCS 15. Motor/sens grossly intract bil.   Psychiatric:        Mood and Affect: Mood normal.     (all labs ordered are listed, but only abnormal results are displayed) Results for orders placed or performed during the hospital encounter of 08/31/24  CBC   Collection Time: 08/31/24 12:23 PM  Result Value Ref Range   WBC 11.7 (H) 4.0 - 10.5 K/uL   RBC 4.86 3.87 - 5.11 MIL/uL   Hemoglobin 13.8 12.0 - 15.0 g/dL   HCT 56.9 63.9 - 53.9 %   MCV 88.5 80.0 - 100.0 fL   MCH 28.4 26.0 - 34.0 pg   MCHC 32.1 30.0 - 36.0 g/dL   RDW 86.7 88.4 - 84.4 %   Platelets 263 150 - 400 K/uL   nRBC 0.0 0.0 - 0.2 %  Comprehensive metabolic panel with GFR   Collection Time: 08/31/24 12:23 PM  Result Value Ref Range   Sodium 137 135 - 145 mmol/L   Potassium 3.7 3.5 - 5.1 mmol/L   Chloride 104 98 - 111 mmol/L   CO2 20 (L) 22 - 32 mmol/L   Glucose, Bld 207 (H) 70 - 99 mg/dL   BUN 18 8 - 23 mg/dL   Creatinine, Ser 9.19 0.44 - 1.00 mg/dL   Calcium  8.5 (L) 8.9 - 10.3 mg/dL   Total Protein 6.0 (L) 6.5 - 8.1 g/dL   Albumin 3.6 3.5 - 5.0 g/dL   AST 25 15 - 41 U/L   ALT 18 0 - 44 U/L   Alkaline Phosphatase 68 38 - 126 U/L   Total Bilirubin 0.5 0.0 - 1.2 mg/dL   GFR, Estimated >39 >39 mL/min   Anion gap 14 5 - 15  Troponin T, High Sensitivity   Collection Time: 08/31/24 12:23 PM  Result Value Ref Range   Troponin T High Sensitivity <15 0 - 19 ng/L  Troponin T, High Sensitivity   Collection  Time: 08/31/24  2:20 PM  Result Value Ref Range   Troponin T High Sensitivity <15 0 - 19 ng/L  Urinalysis, Routine w reflex microscopic -Urine, Clean Catch   Collection Time: 08/31/24  3:04 PM  Result Value Ref Range   Color, Urine STRAW (A) YELLOW   APPearance CLEAR CLEAR   Specific Gravity, Urine 1.017 1.005 - 1.030   pH 5.0 5.0 - 8.0   Glucose, UA >=500 (A) NEGATIVE mg/dL   Hgb urine dipstick NEGATIVE NEGATIVE   Bilirubin Urine NEGATIVE NEGATIVE   Ketones, ur NEGATIVE NEGATIVE mg/dL   Protein, ur NEGATIVE NEGATIVE mg/dL   Nitrite NEGATIVE NEGATIVE   Leukocytes,Ua NEGATIVE NEGATIVE   RBC / HPF 0-5 0 - 5 RBC/hpf   WBC, UA 0-5 0 - 5 WBC/hpf   Bacteria, UA NONE SEEN NONE SEEN   Squamous Epithelial / HPF 0-5 0 - 5 /HPF      EKG: EKG Interpretation Date/Time:  Wednesday August 31 2024 12:25:47 EDT Ventricular Rate:  60 PR Interval:  190 QRS Duration:  98 QT Interval:  446 QTC Calculation: 446 R Axis:   66  Text Interpretation: Sinus rhythm Confirmed by Bernard Drivers ([id number]) on 08/31/2024 1:25:06 PM  Radiology: CT Head Wo Contrast Result Date: 08/31/2024 EXAM: CT HEAD WITHOUT CONTRAST 08/31/2024 02:40:55 PM TECHNIQUE: CT of the head was performed without the administration of intravenous contrast. Automated exposure control, iterative reconstruction, and/or weight based adjustment of the mA/kV was utilized to reduce the radiation dose to as low as reasonably  achievable. COMPARISON: CT head 07/19/2023 CLINICAL HISTORY: Mental status change, unknown cause. Pt BIB EMS after having a syncopal episode. Pt was walking her dog and started feeling dizzy. When EMS got there the pt had a syncopal episode and fell on her right knee. Pt did not hit her head. FINDINGS: BRAIN AND VENTRICLES: No acute hemorrhage. No evidence of acute infarct. No hydrocephalus. No extra-axial collection. No mass effect or midline shift. ORBITS: Bilateral lens replacement. SINUSES: No acute abnormality. SOFT TISSUES  AND SKULL: No acute soft tissue abnormality. No skull fracture. Atherosclerosis at the skull base involving the carotid siphons. IMPRESSION: 1. No acute intracranial abnormality. Electronically signed by: Donnice Mania MD 08/31/2024 03:25 PM EDT RP Workstation: [id number]   DG Knee Complete 4 Views Right Result Date: 08/31/2024 CLINICAL DATA:  Fall, pain EXAM: RIGHT KNEE - COMPLETE 4+ VIEW COMPARISON:  None available FINDINGS: Prior right knee replacement. No hardware complicating feature. No acute bony abnormality. Specifically, no fracture, subluxation, or dislocation. No joint effusion. IMPRESSION: Prior right knee replacement.  No acute bony abnormality. Electronically Signed   By: Franky Crease M.D.   On: 08/31/2024 12:32     Procedures   Medications Ordered in the ED  ibuprofen  (ADVIL ) tablet 200 mg (has no administration in time range)  acetaminophen  (TYLENOL ) tablet 1,000 mg (1,000 mg Oral Given 08/31/24 1400)  traMADol  (ULTRAM ) tablet 50 mg (50 mg Oral Given 08/31/24 1400)  lactated ringers  bolus 1,000 mL (1,000 mLs Intravenous Bolus 08/31/24 1400)                                    Medical Decision Making Problems Addressed: Contusion of right knee, initial encounter: acute illness or injury Hyperglycemia: acute illness or injury Near syncope: acute illness or injury with systemic symptoms that poses a threat to life or bodily functions  Amount and/or Complexity of Data Reviewed Independent Historian: EMS    Details: hx External Data Reviewed: notes. Labs: ordered. Radiology: ordered. ECG/medicine tests: ordered.  Risk OTC drugs. Prescription drug management. Decision regarding hospitalization.   Iv ns. Continuous pulse ox and cardiac monitoring. Labs ordered/sent. Imaging ordered.   Differential diagnosis includes near syncope, dysrhythmia, anemia, dehydration, vasovagal episode, etc. Dispo decision including potential need for admission considered - will get labs and  imaging and reassess.   Reviewed nursing notes and prior charts for additional history. External reports reviewed. Additional history from: EMS.   LR bolus.  Acetaminophen  po, ultram  po.   Cardiac monitor: sinus rhythm, rate 60.  Labs reviewed/interpreted by me - wbc 12, hgb 14. Chem largely unremarkable, glucose mildly high. Trop normal x 2.   Xrays reviewed/interpreted by me - no fx.   CT reviewed/interpreted by me - no hem.   Po fluids/food. Ambulate in hall.   Patient ambulatory in ED. No faintness or dizziness. Steady gait.  Will order walker for home use as w knee soreness ?contusion vs sprain, may help stability,decrease risk of fall.   Additional rechecks, pain improved. No faintness. No cp or sob. No faintness. No fever/chills. No new or recurrent symptoms or complaints.   Pt currently appears stable for ed/card d/c.   Rec close pcp f/u.  Return precautions provided.       Final diagnoses:  Near syncope  Contusion of right knee, initial encounter  Hyperglycemia    ED Discharge Orders          Ordered  Ambulatory referral to Cardiology       Comments: If you have not heard from the Cardiology office within the next 72 hours please call [phone].   08/31/24 1622                Bernard Drivers, MD 08/31/24 1624

## 2024-08-31 NOTE — Discharge Instructions (Signed)
 It was our pleasure to provide your ER care today - we hope that you feel better.  Fall precautions - while knee sore, consider using walker to help with stability and minimize risk of falling.   If you begin to feel faint, sit/lie down immediately as to decrease chance of fainting and/or injury.   Drink plenty of fluids/stay well hydrated. Monitor blood sugars.   Follow up closely with your doctor/cardiologist in the next 1-2 weeks.  Return to ER if worse, new symptoms, fevers/chills, new or severe pain, recurrently feeling weak or faint, chest pain, trouble breathing, or other concern.

## 2024-09-01 ENCOUNTER — Ambulatory Visit

## 2024-09-06 ENCOUNTER — Ambulatory Visit

## 2024-09-12 ENCOUNTER — Other Ambulatory Visit (HOSPITAL_BASED_OUTPATIENT_CLINIC_OR_DEPARTMENT_OTHER): Payer: Self-pay

## 2024-09-15 ENCOUNTER — Ambulatory Visit (INDEPENDENT_AMBULATORY_CARE_PROVIDER_SITE_OTHER): Admitting: Family Medicine

## 2024-09-15 ENCOUNTER — Encounter: Payer: Self-pay | Admitting: Family Medicine

## 2024-09-15 VITALS — Temp 97.9°F | Ht 65.0 in | Wt 173.4 lb

## 2024-09-15 DIAGNOSIS — E1165 Type 2 diabetes mellitus with hyperglycemia: Secondary | ICD-10-CM | POA: Diagnosis not present

## 2024-09-15 DIAGNOSIS — I10 Essential (primary) hypertension: Secondary | ICD-10-CM

## 2024-09-15 DIAGNOSIS — Z7984 Long term (current) use of oral hypoglycemic drugs: Secondary | ICD-10-CM | POA: Diagnosis not present

## 2024-09-15 DIAGNOSIS — M25561 Pain in right knee: Secondary | ICD-10-CM

## 2024-09-15 DIAGNOSIS — I493 Ventricular premature depolarization: Secondary | ICD-10-CM | POA: Diagnosis not present

## 2024-09-15 DIAGNOSIS — R55 Syncope and collapse: Secondary | ICD-10-CM

## 2024-09-15 DIAGNOSIS — Z96653 Presence of artificial knee joint, bilateral: Secondary | ICD-10-CM | POA: Diagnosis not present

## 2024-09-15 DIAGNOSIS — M7051 Other bursitis of knee, right knee: Secondary | ICD-10-CM | POA: Diagnosis not present

## 2024-09-15 LAB — POCT GLYCOSYLATED HEMOGLOBIN (HGB A1C): Hemoglobin A1C: 7.9 % — AB (ref 4.0–5.6)

## 2024-09-15 LAB — MICROALBUMIN / CREATININE URINE RATIO
Creatinine,U: 57.8 mg/dL
Microalb Creat Ratio: UNDETERMINED mg/g (ref 0.0–30.0)
Microalb, Ur: <0.7 mg/dL

## 2024-09-15 NOTE — Patient Instructions (Signed)
 Check to see if you are taking Metformin  XR 500 mg 2 tabs (1000 mg) in am and 2 tabs (1000 mg ) in the evening with meals.    You A1C was 7.9% this visit.  This is still controlled.  It was 7.7% on 01/29/24.

## 2024-09-15 NOTE — Progress Notes (Signed)
 Established Patient Office Visit   Subjective  Patient ID: Tara Guerrero, female    DOB: August 23, 1946  Age: 78 y.o. MRN: [id number]  Chief Complaint  Patient presents with   Medical Management of Chronic Issues    Patient came in today for 3 month follow-up, for Diabetes (BG today 180),  and Jardiance , patient had a syncope episode  on 9/2 patient fell and injured her right knee, patient rates the pain 8 out of 10, Bg at the time was 249, patient didn't eat that day or take any medication     Pt is a 78 yo female seen for f/u on chronic conditions and ED f/u.  Pt seen in ED on 9/2 s/p syncopal episode while walking her dog.  States she passed out as a neighbor was walking her home after she said she felt bad.  EMS called. BS was 249.  CT head and UA in ED negative.  R knee contusion, 8/10 pain .  H/o b/l TKR.  Xray R knee negative. That morning pt had not eaten or taken meds.    Pt hesitant to go walking since the event.  Drinking 5 or so 20 oz glasses of water per day.  Pt states bs has been elevated 180s-200s.  Just realized she may only be taking 1 metfomin in am and 1 in pm instead of 2 BID.  Previous stomach issues have since resolved since stopping Synjardy .    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   Controlled type 2 diabetes mellitus without complication, without long-term current use of insulin  (HCC) 02/02/2024   Paresthesia 02/02/2024   Abrasion of ear canal, right, initial encounter 10/09/2023   Syncope and collapse 07/19/2023   Leukocytosis 07/19/2023   Pneumonia due to COVID-19 virus 07/19/2023   Uncontrolled type 2 diabetes mellitus with hyperglycemia, without long-term current use of insulin  (HCC) 07/19/2023   Renal insufficiency 07/19/2023   Depression 07/19/2023   Palpitations 02/23/2023   Chest pain of uncertain etiology 02/23/2023   Essential hypertension 06/08/2019   Hyperlipidemia  associated with type 2 diabetes mellitus (HCC) 06/08/2019   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   Depression    Diabetes mellitus without complication (HCC)    Diverticulosis    Hyperlipidemia    Hyperplastic colon polyp 11/2014   Hypertension    Sleep apnea    Past Surgical History:  Procedure Laterality Date   ABDOMINAL HYSTERECTOMY  1976   s/p uterine CA   BREAST EXCISIONAL BIOPSY Bilateral    BREAST SURGERY  1995   L breast node removal   BREAST SURGERY     R breast biopsy   CHOLECYSTECTOMY N/A 10/17/2021   Procedure: LAPAROSCOPIC CHOLECYSTECTOMY;  Surgeon: Vanderbilt Ned, MD;  Location: [facility] OR;  Service: General;  Laterality: N/A;   EYE MUSCLE SURGERY  2019   EYE SURGERY  2017   bilateral cataract surgery   GALLBLADDER SURGERY     JOINT REPLACEMENT  2004   bilateral knee replacements   TONSILLECTOMY  1950   Social History   Tobacco Use   Smoking status: Former   Smokeless tobacco: Never  Vaping Use   Vaping status: Never Used  Substance Use Topics   Alcohol use: Yes    Alcohol/week: 1.0 standard drink of alcohol    Types: 1 Glasses of wine per week    Comment: Social  Drug use: Never   Family History  Problem Relation Age of Onset   Cancer Mother    Hyperlipidemia Mother    Hypertension Mother    Stroke Mother    Breast cancer Mother        diagnosed in her 54's   Alcohol abuse Father    Cancer Sister    Hyperlipidemia Sister    Hypertension Sister    Stroke Sister    Breast cancer Sister        diagnosed in her 63's   Mental illness Maternal Grandmother    Hypertension Maternal Grandmother    Hyperlipidemia Maternal Grandmother    Mental retardation Maternal Grandmother    Hearing loss Maternal Grandfather    Stroke Maternal Grandfather    Alcohol abuse Paternal Grandmother    Alcohol abuse Paternal Grandfather    Breast cancer Cousin        diagnosed in her 3's   Breast cancer Sister 64   Breast cancer  Other        diagnosed late 20's   Allergies  Allergen Reactions   Codeine Other (See Comments)    Agitation  Other Reaction: Other reaction    Agitation    ROS Negative unless stated above    Objective:     Temp 97.9 F (36.6 C) (Oral)   Ht 5' 5 (1.651 m)   Wt 173 lb 6.4 oz (78.7 kg)   BMI 28.86 kg/m  BP Readings from Last 3 Encounters:  08/31/24 (!) 129/56  06/22/24 120/60  02/29/24 128/62   Wt Readings from Last 3 Encounters:  09/15/24 173 lb 6.4 oz (78.7 kg)  06/22/24 176 lb (79.8 kg)  03/09/24 182 lb (82.6 kg)      Physical Exam Constitutional:      General: She is not in acute distress.    Appearance: Normal appearance.  HENT:     Head: Normocephalic and atraumatic.     Nose: Nose normal.     Mouth/Throat:     Mouth: Mucous membranes are moist.  Cardiovascular:     Rate and Rhythm: Normal rate and regular rhythm.     Heart sounds: Normal heart sounds. No murmur heard.    No gallop.  Pulmonary:     Effort: Pulmonary effort is normal. No respiratory distress.     Breath sounds: Normal breath sounds. No wheezing, rhonchi or rales.  Musculoskeletal:     Comments: TTP of medial R knee with mild edema.  Skin:    General: Skin is warm and dry.  Neurological:     Mental Status: She is alert and oriented to person, place, and time.        09/15/2024   10:52 AM 06/22/2024    1:11 PM 03/09/2024   10:15 AM  Depression screen PHQ 2/9  Decreased Interest 0 1 0  Down, Depressed, Hopeless 0 0 0  PHQ - 2 Score 0 1 0  Altered sleeping 0 2   Tired, decreased energy 0 2   Change in appetite 0 2   Feeling bad or failure about yourself  0 0   Trouble concentrating 0 0   Moving slowly or fidgety/restless 0 0   Suicidal thoughts 0 0   PHQ-9 Score 0 7       09/15/2024   10:52 AM 06/22/2024    1:12 PM 10/29/2023    9:38 AM 07/30/2023   10:30 AM  GAD 7 : Generalized Anxiety Score  Nervous, Anxious, on Edge  0 0 0 0  Control/stop worrying 0 0 0 0  Worry  too much - different things 0 0 0 0  Trouble relaxing 0 0 0 0  Restless 0 0 0 0  Easily annoyed or irritable 0 0 0 0  Afraid - awful might happen 0 0 0 0  Total GAD 7 Score 0 0 0 0  Anxiety Difficulty Not difficult at all  Not difficult at all      No results found for any visits on 09/15/24.    Assessment & Plan:   Type 2 diabetes mellitus with hyperglycemia, without long-term current use of insulin  (HCC) -     Microalbumin / creatinine urine ratio -     POCT glycosylated hemoglobin (Hb A1C)  Pes anserinus bursitis of right knee  Acute pain of right knee  Status post total bilateral knee replacement  PVC's (premature ventricular contractions)  Syncope and collapse  Possible medication confusion.  Pt will check to see if she has been taking Metformin  XR 500 mg 2 tabs in am and 2 tabs in pm or one tab BID.  Thinks taking 1 tab BID.  Adjust meds accordingly based on usage.  Continue jardiance  25 mg and glipizide  5 mg daily.  Of note Synjardy  d/c'd and prior stomach issues completely resolved.  A1C 7.9% this visit, previously 7.7% on 01/29/24.  Foot exam up to date.  [facility] exam with [facility].  Continue lipitor 20 mg and ramilpril 10 mg daiy.  Acute R knee pain s/p fall.  H/o b/l TKR.  Xray R knee negative.  Supportive care for bursitis and contusion including ice, heat, topical analgesics, Tylenol , etc.  BP controlled.  H/o PVCs.  Stable on Verapamil  180 mg at bedtime,  ramipril  10 mg daily,  and hydrochlorothiazide  25 mg daily.  Continue f/u with Cards.  Discussed the importance of hydration and glycemic control.    Return in about 3 months (around 12/15/2024) for chronic conditions.   Clotilda JONELLE Single, MD

## 2024-10-11 ENCOUNTER — Ambulatory Visit
Admission: RE | Admit: 2024-10-11 | Discharge: 2024-10-11 | Disposition: A | Discharge status: Home / Self Care | Source: Ambulatory Visit | Attending: Family Medicine | Admitting: Family Medicine

## 2024-10-11 DIAGNOSIS — Z1231 Encounter for screening mammogram for malignant neoplasm of breast: Secondary | ICD-10-CM | POA: Diagnosis not present

## 2024-10-13 ENCOUNTER — Telehealth: Payer: Self-pay

## 2024-10-13 NOTE — Progress Notes (Signed)
   10/13/2024  Patient ID: Tara Guerrero, female   DOB: September 05, 1946, 78 y.o.   MRN: [id number]  Patient called in requesting assistance with obtaining Jardiance  refill through the [facility] PAP program.  Provided following instructions: [facility] [employer] Phone: [phone]   That will take you to their automated system. Press 1 to confirm you are a patient, then press 2 to confirm you are calling for Jardiance .   Then, press 7 to speak with a pharmacist. Once you reach them, please state you are calling to enroll in auto-refill services for your Jardiance .  Please reach out with any issus or concerns, we will be reach out in the next 2-4 weeks regarding jardiance  re-enrollment for 2026.  Thank you! [name], PharmD Clinical Pharmacist [phone]

## 2024-10-28 ENCOUNTER — Other Ambulatory Visit (HOSPITAL_BASED_OUTPATIENT_CLINIC_OR_DEPARTMENT_OTHER): Payer: Self-pay

## 2024-11-15 ENCOUNTER — Telehealth: Payer: Self-pay

## 2024-11-15 NOTE — Progress Notes (Unsigned)
   11/15/2024  Patient ID: Tara Guerrero, female   DOB: [birth year], 78 y.o.   MRN: [id number]  Patient called in asking about Jardiance  2026 renewal. Patient would like to come by front office to sign paperwork.  Prepared paperwork, patient plans to come by to sign on 11/16/24.  Patient came in and completed application, faxed into [employer], pending company decision.  [name], PharmD Clinical Pharmacist [phone]

## 2024-11-16 ENCOUNTER — Telehealth: Payer: Self-pay

## 2024-11-16 NOTE — Telephone Encounter (Signed)
 Patient was calling in to see what her status on her shoes were. I see she was here back in July but no other documentation is available. I do not see any orders in [facility], [facility] or [facility]. I see an old order on safestep from 2023. Let me if there is any other information I can use to inform the patient.

## 2024-11-21 ENCOUNTER — Telehealth: Payer: Self-pay

## 2024-11-21 NOTE — Progress Notes (Signed)
   11/21/2024  Patient ID: Tara Guerrero, female   DOB: [birth year], 78 y.o.   MRN: [id number]  Received notification from [facility] that patient's Jardiance  PAP has been renewed through 12/28/25.  [name], PharmD Clinical Pharmacist [phone]

## 2024-12-04 ENCOUNTER — Other Ambulatory Visit: Payer: Self-pay | Admitting: Family Medicine

## 2024-12-04 DIAGNOSIS — E1169 Type 2 diabetes mellitus with other specified complication: Secondary | ICD-10-CM

## 2024-12-08 ENCOUNTER — Other Ambulatory Visit (HOSPITAL_BASED_OUTPATIENT_CLINIC_OR_DEPARTMENT_OTHER): Payer: Self-pay

## 2024-12-08 MED ORDER — GLIPIZIDE 5 MG PO TABS
5.0000 mg | ORAL_TABLET | Freq: Every day | ORAL | 3 refills | Status: AC
  Filled 2024-12-08: qty 90, 90d supply, fill #0

## 2024-12-16 ENCOUNTER — Encounter: Payer: Self-pay | Admitting: Family Medicine

## 2024-12-16 ENCOUNTER — Ambulatory Visit: Admitting: Family Medicine

## 2024-12-16 VITALS — BP 130/64 | HR 70 | Temp 97.2°F | Ht 65.0 in | Wt 173.4 lb

## 2024-12-16 DIAGNOSIS — Z7984 Long term (current) use of oral hypoglycemic drugs: Secondary | ICD-10-CM

## 2024-12-16 DIAGNOSIS — G63 Polyneuropathy in diseases classified elsewhere: Secondary | ICD-10-CM

## 2024-12-16 DIAGNOSIS — I10 Essential (primary) hypertension: Secondary | ICD-10-CM

## 2024-12-16 DIAGNOSIS — E1169 Type 2 diabetes mellitus with other specified complication: Secondary | ICD-10-CM

## 2024-12-16 NOTE — Progress Notes (Signed)
 "  Established Patient Office Visit   Subjective  Patient ID: Tara Guerrero, female    DOB: 06/20/1946  Age: 78 y.o. MRN: [id number]  Chief Complaint  Patient presents with   Medical Management of Chronic Issues    Patient is here for a follow up and wants a handicap sticker and wants information on a urine UCR.    Pt is an 78 year old female seen for follow-up on chronic conditions.  Patient states she is doing well overall.  Endorses continued issues with neuropathy worse at night.  Occasionally causes symptoms during the day and 1 area on the side of right foot.  Wearing diabetic shoes.  Would like to avoid adding additional medication if possible.  Inquires about handicap placard.  States occasionally unsteady with walking but other days okay just depends on neuropathy.  Patient inquires about recent lab results/kidney function.  Stable on Wellbutrin  SR 100 mg.  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   Controlled type 2 diabetes mellitus without complication, without long-term current use of insulin  (HCC) 02/02/2024   Paresthesia 02/02/2024   Abrasion of ear canal, right, initial encounter 10/09/2023   Syncope and collapse 07/19/2023   Leukocytosis 07/19/2023   Pneumonia due to COVID-19 virus 07/19/2023   Uncontrolled type 2 diabetes mellitus with hyperglycemia, without long-term current use of insulin  (HCC) 07/19/2023   Renal insufficiency 07/19/2023   Depression 07/19/2023   Palpitations 02/23/2023   Chest pain of uncertain etiology 02/23/2023   Essential hypertension 06/08/2019   Hyperlipidemia associated with type 2 diabetes mellitus (HCC) 06/08/2019   Past Medical History:  Diagnosis Date   Allergy    seasonal   Arthritis 2020   hands   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   Depression    Diabetes mellitus without complication (HCC)    Diverticulosis     Hyperlipidemia    Hyperplastic colon polyp 11/2014   Hypertension    Sleep apnea    Past Surgical History:  Procedure Laterality Date   ABDOMINAL HYSTERECTOMY  1976   s/p uterine CA   BREAST EXCISIONAL BIOPSY Bilateral    BREAST SURGERY  1995   L breast node removal   BREAST SURGERY     R breast biopsy   CHOLECYSTECTOMY N/A 10/17/2021   Procedure: LAPAROSCOPIC CHOLECYSTECTOMY;  Surgeon: Vanderbilt Ned, MD;  Location: [facility] OR;  Service: General;  Laterality: N/A;   EYE MUSCLE SURGERY  2019   EYE SURGERY  2017   bilateral cataract surgery   GALLBLADDER SURGERY     JOINT REPLACEMENT  2004   bilateral knee replacements   TONSILLECTOMY  1950   Social History[1] Family History  Problem Relation Age of Onset   Cancer Mother    Hyperlipidemia Mother    Hypertension Mother    Stroke Mother    Breast cancer Mother        diagnosed in her 70's   Obesity Mother    Alcohol abuse Father    Cancer Sister    Hyperlipidemia Sister    Hypertension Sister    Stroke Sister    Breast cancer Sister        diagnosed in her 36's   Mental illness Maternal Grandmother    Hypertension Maternal Grandmother    Hyperlipidemia Maternal Grandmother    Mental retardation Maternal Grandmother    Hearing loss Maternal Grandfather  Stroke Maternal Grandfather    Alcohol abuse Paternal Grandmother    Alcohol abuse Paternal Grandfather    Breast cancer Cousin        diagnosed in her 37's   Breast cancer Sister 35   Cancer Sister    Hypertension Sister    Breast cancer Other        diagnosed late 20's   Cancer Maternal Aunt    Cancer Sister    Hearing loss Sister    Allergies[2]  ROS Negative unless stated above    Objective:     BP 130/64 (BP Location: Left Arm, Patient Position: Sitting, Cuff Size: Large)   Pulse 70   Temp (!) 97.2 F (36.2 C) (Oral)   Ht 5' 5 (1.651 m)   Wt 173 lb 6.4 oz (78.7 kg)   SpO2 98%   BMI 28.86 kg/m  BP Readings from Last 3 Encounters:  12/16/24  130/64  08/31/24 (!) 129/56  06/22/24 120/60   Wt Readings from Last 3 Encounters:  12/16/24 173 lb 6.4 oz (78.7 kg)  09/15/24 173 lb 6.4 oz (78.7 kg)  06/22/24 176 lb (79.8 kg)      Physical Exam Constitutional:      General: She is not in acute distress.    Appearance: Normal appearance.  HENT:     Head: Normocephalic and atraumatic.     Nose: Nose normal.     Mouth/Throat:     Mouth: Mucous membranes are moist.  Cardiovascular:     Rate and Rhythm: Normal rate and regular rhythm.     Heart sounds: Normal heart sounds. No murmur heard.    No gallop.  Pulmonary:     Effort: Pulmonary effort is normal. No respiratory distress.     Breath sounds: Normal breath sounds. No wheezing, rhonchi or rales.  Skin:    General: Skin is warm and dry.  Neurological:     Mental Status: She is alert and oriented to person, place, and time.        09/15/2024   10:52 AM 06/22/2024    1:11 PM 03/09/2024   10:15 AM  Depression screen PHQ 2/9  Decreased Interest 0 1 0  Down, Depressed, Hopeless 0 0 0  PHQ - 2 Score 0 1 0  Altered sleeping 0 2   Tired, decreased energy 0 2   Change in appetite 0 2   Feeling bad or failure about yourself  0 0   Trouble concentrating 0 0   Moving slowly or fidgety/restless 0 0   Suicidal thoughts 0 0   PHQ-9 Score 0  7       Data saved with a previous flowsheet row definition      09/15/2024   10:52 AM 06/22/2024    1:12 PM 10/29/2023    9:38 AM 07/30/2023   10:30 AM  GAD 7 : Generalized Anxiety Score  Nervous, Anxious, on Edge 0 0 0 0  Control/stop worrying 0 0 0 0  Worry too much - different things 0 0 0 0  Trouble relaxing 0 0 0 0  Restless 0 0 0 0  Easily annoyed or irritable 0 0 0 0  Afraid - awful might happen 0 0 0 0  Total GAD 7 Score 0 0 0 0  Anxiety Difficulty Not difficult at all  Not difficult at all      No results found for any visits on 12/16/24.    Assessment & Plan:   Polyneuropathy associated with  underlying  disease  Type 2 diabetes mellitus with other specified complication, without long-term current use of insulin  (HCC)  Essential hypertension   Polyneuropathy in fingers and toes.  Possibly related to DM 2.  Discussed treatment options including controlling blood sugar and medication such as gabapentin.  Patient would like to avoid adding additional medication.  Will continue to monitor at this time.  DM controlled given patient's age is A1c 7.9% on 09/15/2024.  Foot exam due early next year.  Continue current medications including Jardiance  25 mg daily, metformin  1000 mg twice daily.  BP controlled.  Continue HCTZ 25 mg daily, ramipril  10 mg daily and verapamil  180 mg nightly.  Continue lifestyle modifications.  Continue follow-up with cardiology.  Stable on Wellbutrin  . No follow-ups on file.   Clotilda JONELLE Single, MD    [1]  Social History Tobacco Use   Smoking status: Former   Smokeless tobacco: Never  Vaping Use   Vaping status: Never Used  Substance Use Topics   Alcohol use: Yes    Alcohol/week: 1.0 standard drink of alcohol    Types: 1 Glasses of wine per week    Comment: Social   Drug use: Never  [2]  Allergies Allergen Reactions   Codeine Other (See Comments)    Agitation  Other Reaction: Other reaction    Agitation   "

## 2024-12-17 ENCOUNTER — Other Ambulatory Visit: Payer: Self-pay | Admitting: Family Medicine

## 2024-12-17 DIAGNOSIS — F3341 Major depressive disorder, recurrent, in partial remission: Secondary | ICD-10-CM

## 2024-12-19 ENCOUNTER — Other Ambulatory Visit (HOSPITAL_BASED_OUTPATIENT_CLINIC_OR_DEPARTMENT_OTHER): Payer: Self-pay

## 2024-12-19 MED ORDER — BUPROPION HCL ER (SR) 100 MG PO TB12
100.0000 mg | ORAL_TABLET | Freq: Every day | ORAL | 0 refills | Status: AC
  Filled 2024-12-19: qty 90, 90d supply, fill #0

## 2025-03-15 ENCOUNTER — Ambulatory Visit

## 2025-04-17 ENCOUNTER — Ambulatory Visit: Admitting: Family Medicine
